# Patient Record
Sex: Female | Born: 1945
Health system: Southern US, Community
[De-identification: ages and names within clinical notes are randomized; demographics above are authoritative.]

## PROBLEM LIST (undated history)

## (undated) DIAGNOSIS — F32A Depression, unspecified: Secondary | ICD-10-CM

## (undated) DIAGNOSIS — F329 Major depressive disorder, single episode, unspecified: Secondary | ICD-10-CM

## (undated) HISTORY — PX: OTHER SURGICAL HISTORY: SHX169

## (undated) HISTORY — PX: ABDOMINAL HYSTERECTOMY: SHX81

## (undated) HISTORY — DX: Depression, unspecified: F32.A

## (undated) HISTORY — PX: BOWEL RESECTION: SHX1257

---
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
## 2000-01-28 ENCOUNTER — Encounter: Payer: Self-pay | Admitting: *Deleted

## 2000-01-28 ENCOUNTER — Encounter: Admission: RE | Admit: 2000-01-28 | Discharge: 2000-01-28 | Payer: Self-pay | Admitting: *Deleted

## 2001-05-01 ENCOUNTER — Encounter: Admission: RE | Admit: 2001-05-01 | Discharge: 2001-05-01 | Payer: Self-pay | Admitting: *Deleted

## 2001-05-01 ENCOUNTER — Encounter: Payer: Self-pay | Admitting: *Deleted

## 2014-07-25 DIAGNOSIS — H2510 Age-related nuclear cataract, unspecified eye: Secondary | ICD-10-CM | POA: Insufficient documentation

## 2015-08-11 DIAGNOSIS — M8589 Other specified disorders of bone density and structure, multiple sites: Secondary | ICD-10-CM | POA: Insufficient documentation

## 2015-08-11 DIAGNOSIS — F325 Major depressive disorder, single episode, in full remission: Secondary | ICD-10-CM | POA: Insufficient documentation

## 2015-08-11 DIAGNOSIS — E782 Mixed hyperlipidemia: Secondary | ICD-10-CM | POA: Insufficient documentation

## 2016-06-22 DIAGNOSIS — F5101 Primary insomnia: Secondary | ICD-10-CM | POA: Insufficient documentation

## 2017-01-10 ENCOUNTER — Emergency Department
Admission: EM | Admit: 2017-01-10 | Discharge: 2017-01-10 | Disposition: A | Discharge status: Home / Self Care | Payer: Medicare Other | Source: Home / Self Care | Attending: Family Medicine | Admitting: Family Medicine

## 2017-01-10 ENCOUNTER — Encounter: Payer: Self-pay | Admitting: Emergency Medicine

## 2017-01-10 DIAGNOSIS — R52 Pain, unspecified: Secondary | ICD-10-CM

## 2017-01-10 DIAGNOSIS — N390 Urinary tract infection, site not specified: Secondary | ICD-10-CM

## 2017-01-10 LAB — POCT URINALYSIS DIP (MANUAL ENTRY)
Glucose, UA: NEGATIVE mg/dL
Nitrite, UA: NEGATIVE
Spec Grav, UA: 1.015 (ref 1.010–1.025)
Urobilinogen, UA: 0.2 E.U./dL
pH, UA: 6 (ref 5.0–8.0)

## 2017-01-10 MED ORDER — CEPHALEXIN 500 MG PO CAPS: 500.0000 mg | ORAL_CAPSULE | Freq: Two times a day (BID) | ORAL | 0 refills | Status: DC

## 2017-01-10 NOTE — ED Triage Notes (Signed)
Patient presents to [facility] with C/O UTI since Friday, seen her PCP, started on Nitrofurantion, Patient has had a fever the last 24 hours. Today 101.1

## 2017-01-10 NOTE — Discharge Instructions (Signed)
°  You should stop taking the Macrobid at this time and start taking the newly prescribed cephalexin (Keflex) antibiotic in case the Macrobid was not treating your mild UTI sufficiently.    Your urine has been sent for a culture for more testing. If your antibiotic needs to be changed again, we will call in a new medication for you.  If the culture is normal, you may be coming down with a separate viral illness.   Be sure to stay well hydrated with at least eight 8oz glasses of water each day and at least 8 hours of sleep at night. You may have acetaminophen and ibuprofen as needed for fever and pain.

## 2017-01-10 NOTE — ED Provider Notes (Signed)
[facility]    CSN: [id number] Arrival date & time: 01/10/17  1754     History   Chief Complaint Chief Complaint  Patient presents with  . Recurrent UTI    HPI Amanda Schmitt is a 71 y.o. female.   HPI  Amanda Schmitt is a 71 y.o. female presenting to UC with c/o UTI for 3 days.  She was seen by her PCP on Friday and was prescribed Macrobid.  A culture was done, which showed 10,000-50,000 colonies but no susceptibly chart given. Since last night, she has had fatigue, body aches, sweats, chills, and fever up to 101.1*F.  She does have a mild cough but denies congestion or any other URI symptoms. No known sick contacts.  She does have mild bladder pressure. She is concerned the Macrobid, which she has never had before, is not working.  She has not taken a dose of that medication today.  Denies n/v/d.   History reviewed. No pertinent past medical history.  There are no active problems to display for this patient.   History reviewed. No pertinent surgical history.  OB History    No data available       Home Medications    Prior to Admission medications   Medication Sig Start Date End Date Taking? Authorizing Provider  acetaminophen (TYLENOL) 500 MG tablet Take 500 mg by mouth every 6 (six) hours as needed.   Yes [provider]  escitalopram (LEXAPRO) 10 MG tablet Take 10 mg by mouth daily.   Yes [provider]  cephALEXin (KEFLEX) 500 MG capsule Take 1 capsule (500 mg total) by mouth 2 (two) times daily. 01/10/17   Lurene Shadow, PA-C    Family History History reviewed. No pertinent family history.  Social History Social History  Substance Use Topics  . Smoking status: Never Smoker  . Smokeless tobacco: Never Used  . Alcohol use 1.2 oz/week    2 Glasses of wine per week     Comment: per night     Allergies   Codeine and Tramadol   Review of Systems Review of Systems  Constitutional: Positive for chills, diaphoresis and fever.    HENT: Negative for congestion.   Respiratory: Positive for choking. Negative for shortness of breath and wheezing.   Gastrointestinal: Negative for diarrhea, nausea and vomiting.  Genitourinary: Positive for dysuria (minimal) and pelvic pain (pressure). Negative for flank pain, frequency and urgency.  Musculoskeletal: Positive for arthralgias and myalgias. Negative for back pain.       Body aches  Skin: Negative for rash.     Physical Exam Triage Vital Signs ED Triage Vitals  Enc Vitals Group     BP 01/10/17 1822 112/72     Pulse Rate 01/10/17 1822 83     Resp 01/10/17 1822 16     Temp 01/10/17 1822 99.1 F (37.3 C)     Temp Source 01/10/17 1822 Oral     SpO2 01/10/17 1822 96 %     Weight 01/10/17 1822 137 lb (62.1 kg)     Height 01/10/17 1822 5' (1.524 m)     Head Circumference --      Peak Flow --      Pain Score 01/10/17 1823 5     Pain Loc --      Pain Edu? --      Excl. in GC? --    No data found.   Updated Vital Signs BP 112/72 (BP Location: Left Arm)  Pulse 83   Temp 99.1 F (37.3 C) (Oral)   Resp 16   Ht 5' (1.524 m)   Wt 137 lb (62.1 kg)   SpO2 96%   BMI 26.76 kg/m   Visual Acuity Right Eye Distance:   Left Eye Distance:   Bilateral Distance:    Right Eye Near:   Left Eye Near:    Bilateral Near:     Physical Exam  Constitutional: She is oriented to person, place, and time. She appears well-developed and well-nourished. No distress.  HENT:  Head: Normocephalic and atraumatic.  Mouth/Throat: Oropharynx is clear and moist.  Eyes: EOM are normal.  Neck: Normal range of motion.  Cardiovascular: Normal rate and regular rhythm.   Pulmonary/Chest: Effort normal and breath sounds normal. No respiratory distress. She has no wheezes. She has no rales.  Abdominal: Soft. She exhibits no distension. There is no tenderness. There is no CVA tenderness.  Musculoskeletal: Normal range of motion.  Neurological: She is alert and oriented to person, place, and  time.  Skin: Skin is warm and dry. She is not diaphoretic.  Psychiatric: She has a normal mood and affect. Her behavior is normal.  Nursing note and vitals reviewed.    UC Treatments / Results  Labs (all labs ordered are listed, but only abnormal results are displayed) Labs Reviewed  POCT URINALYSIS DIP (MANUAL ENTRY) - Abnormal; Notable for the following:       Result Value   Bilirubin, UA small (*)    Ketones, POC UA moderate (40) (*)    Blood, UA trace-intact (*)    Protein Ur, POC trace (*)    Leukocytes, UA Trace (*)    All other components within normal limits  URINE CULTURE    EKG  EKG Interpretation None       Radiology No results found.  Procedures Procedures (including critical care time)  Medications Ordered in UC Medications - No data to display   Initial Impression / Assessment and Plan / UC Course  I have reviewed the triage vital signs and the nursing notes.  Pertinent labs & imaging results that were available during my care of the patient were reviewed by me and considered in my medical decision making (see chart for details).    UA: abnormal but no definite UTI Urine culture sent.   Final Clinical Impressions(s) / UC Diagnoses   Final diagnoses:  Body aches  Recurrent UTI   Symptoms could be due to worsening UTI vs new onset unrelated viral illness.  In meantime, will change pt from Macrobid to Keflex. Encouraged good hydration.  F/u with PCP later this week if not improving.   New Prescriptions Discharge Medication List as of 01/10/2017  6:38 PM    START taking these medications   Details  cephALEXin (KEFLEX) 500 MG capsule Take 1 capsule (500 mg total) by mouth 2 (two) times daily., Starting Mon 01/10/2017, Normal         Controlled Substance Prescriptions [place] Controlled Substance Registry consulted? Not Applicable   Rolla Platehelps, Jaine Estabrooks O, PA-C 01/10/17 [id number]

## 2017-01-13 LAB — URINE CULTURE

## 2017-01-14 ENCOUNTER — Telehealth: Payer: Self-pay | Admitting: *Deleted

## 2017-01-14 NOTE — Telephone Encounter (Signed)
Callback: Patient notified of UCX results. Encouraged to complete course. She reports she is feeling much better.

## 2017-07-08 ENCOUNTER — Other Ambulatory Visit: Payer: Self-pay

## 2017-07-08 ENCOUNTER — Encounter: Payer: Self-pay | Admitting: Emergency Medicine

## 2017-07-08 ENCOUNTER — Emergency Department
Admission: EM | Admit: 2017-07-08 | Discharge: 2017-07-08 | Disposition: A | Discharge status: Home / Self Care | Payer: Medicare Other | Source: Home / Self Care | Attending: Family Medicine | Admitting: Family Medicine

## 2017-07-08 DIAGNOSIS — R300 Dysuria: Secondary | ICD-10-CM | POA: Diagnosis not present

## 2017-07-08 LAB — POCT URINALYSIS DIP (MANUAL ENTRY)
BILIRUBIN UA: NEGATIVE
GLUCOSE UA: NEGATIVE mg/dL
Ketones, POC UA: NEGATIVE mg/dL
NITRITE UA: POSITIVE — AB
Protein Ur, POC: NEGATIVE mg/dL
SPEC GRAV UA: 1.01 (ref 1.010–1.025)
Urobilinogen, UA: 0.2 E.U./dL
pH, UA: 7 (ref 5.0–8.0)

## 2017-07-08 MED ORDER — CEPHALEXIN 500 MG PO CAPS: 500.0000 mg | ORAL_CAPSULE | Freq: Two times a day (BID) | ORAL | 0 refills | Status: DC

## 2017-07-08 NOTE — ED Provider Notes (Signed)
[facility]    CSN: [id number] Arrival date & time: 07/08/17  1620     History   Chief Complaint Chief Complaint  Patient presents with  . Dysuria    HPI Amanda Schmitt is a 72 y.o. female.   Last night patient noticed mild urinary discomfort, worse this morning and improved with AZO.  She had similar symptoms one week ago that resolved after taking AZO.  She denies abdominal, pelvic, or flank pain.  No fevers, chills, and sweats.   The history is provided by the patient.  Dysuria  Pain quality:  Burning Pain severity:  Mild Onset quality:  Sudden Duration:  8 hours Timing:  Constant Progression:  Worsening Chronicity:  Recurrent Recent urinary tract infections: no   Relieved by:  Nothing Worsened by:  Nothing Ineffective treatments:  Phenazopyridine Urinary symptoms: frequent urination and hesitancy   Urinary symptoms: no discolored urine, no foul-smelling urine, no hematuria and no bladder incontinence   Associated symptoms: no abdominal pain, no fever, no flank pain, no nausea, no vaginal discharge and no vomiting     History reviewed. No pertinent past medical history.  There are no active problems to display for this patient.   History reviewed. No pertinent surgical history.  OB History    No data available       Home Medications    Prior to Admission medications   Medication Sig Start Date End Date Taking? Authorizing Provider  acetaminophen (TYLENOL) 500 MG tablet Take 500 mg by mouth every 6 (six) hours as needed.    [provider]  cephALEXin (KEFLEX) 500 MG capsule Take 1 capsule (500 mg total) by mouth 2 (two) times daily. 07/08/17   Lattie HawBeese, Ameri Cahoon A, MD  escitalopram (LEXAPRO) 10 MG tablet Take 10 mg by mouth daily.    [provider]    Family History No family history on file.  Social History Social History   Tobacco Use  . Smoking status: Never Smoker  . Smokeless tobacco: Never Used  Substance Use  Topics  . Alcohol use: Yes    Alcohol/week: 1.2 oz    Types: 2 Glasses of wine per week    Comment: per night  . Drug use: No     Allergies   Codeine; Nitrofuran derivatives; and Tramadol   Review of Systems Review of Systems  Constitutional: Negative for fever.  Gastrointestinal: Negative for abdominal pain, nausea and vomiting.  Genitourinary: Positive for dysuria. Negative for flank pain and vaginal discharge.  All other systems reviewed and are negative.    Physical Exam Triage Vital Signs ED Triage Vitals  Enc Vitals Group     BP 07/08/17 1655 131/75     Pulse Rate 07/08/17 1655 62     Resp --      Temp 07/08/17 1655 97.9 F (36.6 C)     Temp Source 07/08/17 1655 Oral     SpO2 07/08/17 1655 97 %     Weight 07/08/17 1656 137 lb (62.1 kg)     Height 07/08/17 1656 5' (1.524 m)     Head Circumference --      Peak Flow --      Pain Score 07/08/17 1655 3     Pain Loc --      Pain Edu? --      Excl. in GC? --    No data found.  Updated Vital Signs BP 131/75 (BP Location: Right Arm)   Pulse 62   Temp  97.9 F (36.6 C) (Oral)   Ht 5' (1.524 m)   Wt 137 lb (62.1 kg)   SpO2 97%   BMI 26.76 kg/m   Visual Acuity Right Eye Distance:   Left Eye Distance:   Bilateral Distance:    Right Eye Near:   Left Eye Near:    Bilateral Near:     Physical Exam Nursing notes and Vital Signs reviewed. Appearance:  Patient appears stated age, and in no acute distress.    Eyes:  Pupils are equal, round, and reactive to light and accomodation.  Extraocular movement is intact.  Conjunctivae are not inflamed   Pharynx:  Normal; moist mucous membranes  Neck:  Supple.  No adenopathy Lungs:  Clear to auscultation.  Breath sounds are equal.  Moving air well. Heart:  Regular rate and rhythm without murmurs, rubs, or gallops.  Abdomen:  Nontender without masses or hepatosplenomegaly.  Bowel sounds are present.  No CVA or flank tenderness.  Extremities:  No edema.  Skin:  No rash  present.     UC Treatments / Results  Labs (all labs ordered are listed, but only abnormal results are displayed) Labs Reviewed  POCT URINALYSIS DIP (MANUAL ENTRY) - Abnormal; Notable for the following components:      Result Value   Blood, UA trace-intact (*)    Nitrite, UA Positive (*)    Leukocytes, UA Trace (*)    All other components within normal limits  URINE CULTURE    EKG  EKG Interpretation None       Radiology No results found.  Procedures Procedures (including critical care time)  Medications Ordered in UC Medications - No data to display   Initial Impression / Assessment and Plan / UC Course  I have reviewed the triage vital signs and the nursing notes.  Pertinent labs & imaging results that were available during my care of the patient were reviewed by me and considered in my medical decision making (see chart for details).    Urine culture pending. Begin Kefltex 500mg  BID for one week. Increase fluid intake. May use non-prescription AZO for about two days, if desired, to decrease urinary discomfort.  If symptoms become significantly worse during the night or over the weekend, proceed to the local emergency room.  Followup with Family Doctor if not improved in one week.     Final Clinical Impressions(s) / UC Diagnoses   Final diagnoses:  Dysuria    ED Discharge Orders        Ordered    cephALEXin (KEFLEX) 500 MG capsule  2 times daily     07/08/17 1713           Lattie Haw, MD 07/08/17 1721

## 2017-07-08 NOTE — Discharge Instructions (Signed)
Increase fluid intake. May use non-prescription AZO for about two days, if desired, to decrease urinary discomfort.  If symptoms become significantly worse during the night or over the weekend, proceed to the local emergency room.  

## 2017-07-08 NOTE — ED Triage Notes (Signed)
Dysuria Sx started last weekend and resolved after drinking large amounts of water. Then returned last night, took AZO this morning

## 2017-07-11 ENCOUNTER — Telehealth: Payer: Self-pay

## 2017-07-11 LAB — URINE CULTURE
MICRO NUMBER:: 90269206
SPECIMEN QUALITY:: ADEQUATE

## 2017-07-11 NOTE — Telephone Encounter (Signed)
Spoke with patient and gave lab results.  Feeling much better, will follow up as needed.

## 2017-10-06 ENCOUNTER — Emergency Department
Admission: EM | Admit: 2017-10-06 | Discharge: 2017-10-06 | Disposition: A | Discharge status: Home / Self Care | Payer: Medicare Other | Source: Home / Self Care

## 2017-10-06 ENCOUNTER — Other Ambulatory Visit: Payer: Self-pay

## 2017-10-06 ENCOUNTER — Encounter: Payer: Self-pay | Admitting: Emergency Medicine

## 2017-10-06 DIAGNOSIS — N390 Urinary tract infection, site not specified: Secondary | ICD-10-CM

## 2017-10-06 LAB — POCT URINALYSIS DIP (MANUAL ENTRY)
Bilirubin, UA: NEGATIVE
Glucose, UA: 100 mg/dL — AB
Nitrite, UA: POSITIVE — AB
Protein Ur, POC: NEGATIVE mg/dL
Spec Grav, UA: <=1.005 — AB (ref 1.010–1.025)
Urobilinogen, UA: 1 E.U./dL
pH, UA: 5 (ref 5.0–8.0)

## 2017-10-06 MED ORDER — CIPROFLOXACIN HCL 500 MG PO TABS: 500.0000 mg | ORAL_TABLET | Freq: Two times a day (BID) | ORAL | 1 refills | Status: DC

## 2017-10-06 NOTE — ED Triage Notes (Signed)
Dysuria, polyuria started today 

## 2017-10-06 NOTE — ED Provider Notes (Signed)
[facility]   [id number] 10/06/17 Arrival Time: 1830   SUBJECTIVE:  Amanda Schmitt is a 72 y.o. female who presents to the urgent care with complaint of dysuria, frequency, and some lower abdominal crampy pain which began this afternoon.  No fever, flank pain, hematuria, or nausea  This is the third UTI this patient has suffered in the last year.  She has a physical coming up in September and I suggest that she bring this up with her primary care doctor at that time unless she develops 2 more UTIs before then.     History reviewed. No pertinent past medical history. No family history on file. Social History   Socioeconomic History  . Marital status: Single    Spouse name: Not on file  . Number of children: Not on file  . Years of education: Not on file  . Highest education level: Not on file  Occupational History  . Not on file  Social Needs  . Financial resource strain: Not on file  . Food insecurity:    Worry: Not on file    Inability: Not on file  . Transportation needs:    Medical: Not on file    Non-medical: Not on file  Tobacco Use  . Smoking status: Never Smoker  . Smokeless tobacco: Never Used  Substance and Sexual Activity  . Alcohol use: Yes    Alcohol/week: 1.2 oz    Types: 2 Glasses of wine per week    Comment: per night  . Drug use: No  . Sexual activity: Not on file  Lifestyle  . Physical activity:    Days per week: Not on file    Minutes per session: Not on file  . Stress: Not on file  Relationships  . Social connections:    Talks on phone: Not on file    Gets together: Not on file    Attends religious service: Not on file    Active member of club or organization: Not on file    Attends meetings of clubs or organizations: Not on file    Relationship status: Not on file  . Intimate partner violence:    Fear of current or ex partner: Not on file    Emotionally abused: Not on file    Physically abused: Not on file    Forced sexual  activity: Not on file  Other Topics Concern  . Not on file  Social History Narrative  . Not on file   Current Meds  Medication Sig  . zolpidem (AMBIEN) 5 MG tablet Take 5 mg by mouth at bedtime as needed for sleep.   Allergies  Allergen Reactions  . Codeine Nausea Only  . Nitrofuran Derivatives   . Tramadol Nausea Only      ROS: As per HPI, remainder of ROS negative.   OBJECTIVE:   Vitals:   10/06/17 1853 10/06/17 1854  BP: 128/73   Pulse: 70   Temp: (!) 97.4 F (36.3 C)   TempSrc: Oral   SpO2: 98%   Weight:  132 lb (59.9 kg)  Height:  5' (1.524 m)     General appearance: alert; no distress Eyes: PERRL; EOMI; conjunctiva normal HENT: normocephalic; atraumaticoral mucosa normal Neck: supple Lungs: clear to auscultation bilaterally Heart: regular rate and rhythm Abdomen: soft, non-tender; bowel sounds normal; no masses or organomegaly; no guarding or rebound tenderness Back: no CVA tenderness Extremities: no cyanosis or edema; symmetrical with no gross deformities Skin: warm and dry Neurologic: normal  gait; grossly normal Psychological: alert and cooperative; normal mood and affect      Labs:  Results for orders placed or performed during the hospital encounter of 10/06/17  POCT urinalysis dipstick (new)  Result Value Ref Range   Color, UA orange (A) yellow   Clarity, UA clear clear   Glucose, UA =100 (A) negative mg/dL   Bilirubin, UA negative negative   Ketones, POC UA small (15) (A) negative mg/dL   Spec Grav, UA <=1.610 (A) 1.010 - 1.025   Blood, UA small (A) negative   pH, UA 5.0 5.0 - 8.0   Protein Ur, POC negative negative mg/dL   Urobilinogen, UA 1.0 0.2 or 1.0 E.U./dL   Nitrite, UA Positive (A) Negative   Leukocytes, UA Trace (A) Negative    Labs Reviewed  POCT URINALYSIS DIP (MANUAL ENTRY) - Abnormal; Notable for the following components:      Result Value   Color, UA orange (*)    Glucose, UA =100 (*)    Ketones, POC UA small (15)  (*)    Spec Grav, UA <=1.005 (*)    Blood, UA small (*)    Nitrite, UA Positive (*)    Leukocytes, UA Trace (*)    All other components within normal limits  URINE CULTURE    No results found.     ASSESSMENT & PLAN:  1. Lower urinary tract infectious disease     Meds ordered this encounter  Medications  . ciprofloxacin (CIPRO) 500 MG tablet    Sig: Take 1 tablet (500 mg total) by mouth 2 (two) times daily.    Dispense:  20 tablet    Refill:  1    Reviewed expectations re: course of current medical issues. Questions answered. Outlined signs and symptoms indicating need for more acute intervention. Patient verbalized understanding. After Visit Summary given.    Procedures:      Elvina Sidle, MD 10/06/17 1859

## 2017-10-08 ENCOUNTER — Telehealth: Payer: Self-pay | Admitting: Emergency Medicine

## 2017-10-08 LAB — URINE CULTURE
MICRO NUMBER:: 90653562
SPECIMEN QUALITY:: ADEQUATE

## 2017-10-08 NOTE — Telephone Encounter (Signed)
Patient informed of negative urine culture results.  Patient states that she is feeling better and will continue medication as prescribed. Once she completes the antbs, if sxs return, she will follow up with her PCP.

## 2018-05-24 LAB — HM DEXA SCAN

## 2018-06-09 LAB — COLOGUARD: Cologuard: NEGATIVE

## 2019-08-13 LAB — HM MAMMOGRAPHY: HM Mammogram: NORMAL (ref 0–4)

## 2019-09-27 ENCOUNTER — Ambulatory Visit (INDEPENDENT_AMBULATORY_CARE_PROVIDER_SITE_OTHER): Payer: Medicare PPO

## 2019-09-27 ENCOUNTER — Ambulatory Visit (INDEPENDENT_AMBULATORY_CARE_PROVIDER_SITE_OTHER): Payer: Medicare PPO | Admitting: Family Medicine

## 2019-09-27 ENCOUNTER — Encounter: Payer: Self-pay | Admitting: Family Medicine

## 2019-09-27 ENCOUNTER — Other Ambulatory Visit: Payer: Self-pay

## 2019-09-27 VITALS — BP 146/69 | HR 72 | Temp 98.1°F | Ht 60.0 in | Wt 129.7 lb

## 2019-09-27 DIAGNOSIS — M79674 Pain in right toe(s): Secondary | ICD-10-CM

## 2019-09-27 DIAGNOSIS — R1013 Epigastric pain: Secondary | ICD-10-CM

## 2019-09-27 DIAGNOSIS — R002 Palpitations: Secondary | ICD-10-CM | POA: Diagnosis not present

## 2019-09-27 DIAGNOSIS — G500 Trigeminal neuralgia: Secondary | ICD-10-CM | POA: Insufficient documentation

## 2019-09-27 LAB — CBC
HCT: 37.3 % (ref 35.0–45.0)
Hemoglobin: 12.8 g/dL (ref 11.7–15.5)
MCH: 34 pg — ABNORMAL HIGH (ref 27.0–33.0)
MCHC: 34.3 g/dL (ref 32.0–36.0)
MCV: 99.2 fL (ref 80.0–100.0)
MPV: 10.4 fL (ref 7.5–12.5)
Platelets: 281 10*3/uL (ref 140–400)
RBC: 3.76 10*6/uL — ABNORMAL LOW (ref 3.80–5.10)
RDW: 11.6 % (ref 11.0–15.0)
WBC: 6.6 10*3/uL (ref 3.8–10.8)

## 2019-09-27 MED ORDER — OMEPRAZOLE 40 MG PO CPDR: 40.0000 mg | DELAYED_RELEASE_CAPSULE | Freq: Every day | ORAL | 0 refills | Status: DC

## 2019-09-27 NOTE — Patient Instructions (Addendum)
Great to meet you today! EKG looks ok.   We'll be in touch with results and recommendations from your xray and lab work.  Let's restart prilosec and see if that helps with the discomfort in the upper abdomen as well.  See me again in about 6 weeks for follow up of this.

## 2019-09-27 NOTE — Progress Notes (Signed)
Amanda Schmitt - 74 y.o. female MRN [id number]  Date of birth: 20-Oct-1945  Subjective Chief Complaint  Patient presents with  . Establish Care    HPI Amanda Schmitt is a 74 y.o. female with history of GERD, HLD, and idiopathic constipation here today for initial visit.  She reports that she has had sensation of palpitations in her chest as well as epigastric pain.  Started about 3 weeks ago. Palpitations come and go.  No associated dizziness or shortness of breath. She does not have chest pain or shortness of breath.  She does feel nauseous in the mornings but improves after eating.  She is taking pepcid and maalox which helps some.  History of GERD that resolved with prilosec previously.    She also has complaint of R toe pain.  Does not recall injury but describe as sharp and throbbing.  She would like to have xray.   ROS:  A comprehensive ROS was completed and negative except as noted per HPI  Allergies  Allergen Reactions  . Codeine Nausea Only  . Nitrofuran Derivatives   . Tramadol Nausea Only    History reviewed. No pertinent past medical history.  Past Surgical History:  Procedure Laterality Date  . ABDOMINAL HYSTERECTOMY    . Bowel blockage      Social History   Socioeconomic History  . Marital status: Single    Spouse name: Not on file  . Number of children: Not on file  . Years of education: Not on file  . Highest education level: Not on file  Occupational History  . Not on file  Tobacco Use  . Smoking status: Former Smoker    Packs/day: 0.50    Years: 26.00    Pack years: 13.00    Quit date: 1999    Years since quitting: 22.3  . Smokeless tobacco: Never Used  Substance and Sexual Activity  . Alcohol use: Yes    Alcohol/week: 2.0 standard drinks    Types: 2 Glasses of wine per week    Comment: per night  . Drug use: No  . Sexual activity: Not Currently  Other Topics Concern  . Not on file  Social History Narrative  . Not on file   Social  Determinants of Health   Financial Resource Strain:   . Difficulty of Paying Living Expenses:   Food Insecurity:   . Worried About Programme researcher, broadcasting/film/video in the Last Year:   . Barista in the Last Year:   Transportation Needs:   . Freight forwarder (Medical):   Marland Kitchen Lack of Transportation (Non-Medical):   Physical Activity:   . Days of Exercise per Week:   . Minutes of Exercise per Session:   Stress:   . Feeling of Stress :   Social Connections:   . Frequency of Communication with Friends and Family:   . Frequency of Social Gatherings with Friends and Family:   . Attends Religious Services:   . Active Member of Clubs or Organizations:   . Attends Banker Meetings:   Marland Kitchen Marital Status:     History reviewed. No pertinent family history.  Health Maintenance  Topic Date Due  . Hepatitis C Screening  Never done  . MAMMOGRAM  Never done  . COLONOSCOPY  Never done  . DEXA SCAN  Never done  . INFLUENZA VACCINE  12/09/2019  . TETANUS/TDAP  08/24/2026  . COVID-19 Vaccine  Completed  . PNA vac Low Risk Adult  Completed     ----------------------------------------------------------------------------------------------------------------------------------------------------------------------------------------------------------------- Physical Exam BP (!) 146/69 (BP Location: Left Arm, Patient Position: Sitting, Cuff Size: Normal)   Pulse 72   Temp 98.1 F (36.7 C) (Temporal)   Wt 129 lb 11.2 oz (58.8 kg)   SpO2 100%   BMI 25.33 kg/m   Physical Exam Constitutional:      Appearance: Normal appearance.  HENT:     Head: Normocephalic and atraumatic.  Eyes:     General: No scleral icterus. Cardiovascular:     Rate and Rhythm: Normal rate and regular rhythm.     Comments: Occasional extra beat.  Pulmonary:     Effort: Pulmonary effort is normal.     Breath sounds: Normal breath sounds.  Abdominal:     General: Abdomen is flat. There is no distension.      Palpations: Abdomen is soft.     Tenderness: There is no abdominal tenderness.  Musculoskeletal:     Cervical back: Neck supple.  Neurological:     General: No focal deficit present.     Mental Status: She is alert.  Psychiatric:        Mood and Affect: Mood normal.        Behavior: Behavior normal.     ------------------------------------------------------------------------------------------------------------------------------------------------------------------------------------------------------------------- Assessment and Plan  Palpitations She has what sounds like a few ectopic beats on exam, likely PVC's EKG NSR with rate of 63.  No ischemic changes/ST elevation.  Question whether her symptoms may be from esophageal spasm as well. Restarting prilosec.  Check TSH, cbc and electrolytes  No follow-ups on file.    Epigastric pain Likely related to GERD, restart prilosec.   Toe pain, right Xray ordered.    Meds ordered this encounter  Medications  . omeprazole (PRILOSEC) 40 MG capsule    Sig: Take 1 capsule (40 mg total) by mouth daily.    Dispense:  30 capsule    Refill:  0    No follow-ups on file.    This visit occurred during the SARS-CoV-2 public health emergency.  Safety protocols were in place, including screening questions prior to the visit, additional usage of staff PPE, and extensive cleaning of exam room while observing appropriate contact time as indicated for disinfecting solutions.

## 2019-09-27 NOTE — Assessment & Plan Note (Signed)
X-ray ordered.

## 2019-09-27 NOTE — Assessment & Plan Note (Signed)
She has what sounds like a few ectopic beats on exam, likely PVC's EKG NSR with rate of 63.  No ischemic changes/ST elevation.  Question whether her symptoms may be from esophageal spasm as well. Restarting prilosec.  Check TSH, cbc and electrolytes  No follow-ups on file.

## 2019-09-27 NOTE — Assessment & Plan Note (Signed)
Likely related to GERD, restart prilosec.

## 2019-09-28 LAB — COMPLETE METABOLIC PANEL WITH GFR
AG Ratio: 1.9 (calc) (ref 1.0–2.5)
ALT: 9 U/L (ref 6–29)
AST: 20 U/L (ref 10–35)
Albumin: 4.5 g/dL (ref 3.6–5.1)
Alkaline phosphatase (APISO): 77 U/L (ref 37–153)
BUN: 16 mg/dL (ref 7–25)
CO2: 26 mmol/L (ref 20–32)
Calcium: 9.7 mg/dL (ref 8.6–10.4)
Chloride: 103 mmol/L (ref 98–110)
Creat: 0.75 mg/dL (ref 0.60–0.93)
GFR, Est African American: 91 mL/min/{1.73_m2} (ref >=60)
GFR, Est Non African American: 79 mL/min/{1.73_m2} (ref >=60)
Globulin: 2.4 g/dL (calc) (ref 1.9–3.7)
Glucose, Bld: 85 mg/dL (ref 65–99)
Potassium: 4.8 mmol/L (ref 3.5–5.3)
Sodium: 139 mmol/L (ref 135–146)
Total Bilirubin: 0.6 mg/dL (ref 0.2–1.2)
Total Protein: 6.9 g/dL (ref 6.1–8.1)

## 2019-09-28 LAB — TSH: TSH: 2.38 mIU/L (ref 0.40–4.50)

## 2019-10-11 ENCOUNTER — Other Ambulatory Visit: Payer: Self-pay | Admitting: Family Medicine

## 2019-10-11 ENCOUNTER — Encounter: Payer: Self-pay | Admitting: Family Medicine

## 2019-10-11 MED ORDER — ZOLPIDEM TARTRATE 10 MG PO TABS: 10.0000 mg | ORAL_TABLET | Freq: Every evening | ORAL | 5 refills | Status: DC | PRN

## 2019-10-16 ENCOUNTER — Ambulatory Visit (INDEPENDENT_AMBULATORY_CARE_PROVIDER_SITE_OTHER): Payer: Medicare PPO | Admitting: Family Medicine

## 2019-10-16 ENCOUNTER — Encounter: Payer: Self-pay | Admitting: Family Medicine

## 2019-10-16 DIAGNOSIS — F411 Generalized anxiety disorder: Secondary | ICD-10-CM | POA: Diagnosis not present

## 2019-10-16 MED ORDER — ESCITALOPRAM OXALATE 10 MG PO TABS: 10.0000 mg | ORAL_TABLET | Freq: Every day | ORAL | 0 refills | Status: DC

## 2019-10-16 MED ORDER — CLONAZEPAM 0.5 MG PO TABS: 0.5000 mg | ORAL_TABLET | Freq: Two times a day (BID) | ORAL | 1 refills | Status: DC | PRN

## 2019-10-16 NOTE — Patient Instructions (Signed)
Nice to see you today! Restart lexapro: 5mg  for first week then increase to 10mg  daily.  You may use clonazepam up to twice per day as needed for severe anxiety symptoms.  Please follow up with me in about 6 weeks.

## 2019-10-16 NOTE — Assessment & Plan Note (Signed)
Will restart lexapro 5mg  for 1st week then increase to 10mg .  Will also add clonazepam 0.5mg , 1/2-1 tab bid prn short term for the first few weeks until lexapro is effective for her.  She will continue ambien at bedtime.  If she continues to have difficulty with sleep we can consider trying extended release version.

## 2019-10-16 NOTE — Progress Notes (Signed)
Amanda Schmitt - 74 y.o. female MRN [id number]  Date of birth: [birth year]  Subjective Chief Complaint  Patient presents with  . Anxiety    HPI Amanda Schmitt is a 74 y.o. female here today for follow up of anxiety and GI symptoms.  She feels like her GI symptoms may be coming from her anxiety.  She has weaned herself from lexapro and is now having symptoms that she was having before she started on lexapro including increased palpitations and worry.  She is also having increased difficulty with sleeping and feels tense all of the time.  She has had some improvement in GERD symptoms with prilosec.  She would like to restart lexapro but feels like she may need something temporarily until lexapro is effective again for her.    Depression screen [facility] 2/9 10/16/2019 09/27/2019  Decreased Interest 1 0  Down, Depressed, Hopeless 0 0  PHQ - 2 Score 1 0  Altered sleeping 3 0  Tired, decreased energy 0 0  Change in appetite 0 0  Feeling bad or failure about yourself  0 0  Trouble concentrating 1 0  Moving slowly or fidgety/restless 0 0  Suicidal thoughts 0 0  PHQ-9 Score 5 0  Difficult doing work/chores Somewhat difficult -   GAD 7 : Generalized Anxiety Score 10/16/2019 09/27/2019  Nervous, Anxious, on Edge 3 0  Control/stop worrying 2 0  Worry too much - different things 2 0  Trouble relaxing 1 0  Restless 0 0  Easily annoyed or irritable 1 0  Afraid - awful might happen 0 0  Total GAD 7 Score 9 0  Anxiety Difficulty Somewhat difficult -      ROS:  A comprehensive ROS was completed and negative except as noted per HPI  Allergies  Allergen Reactions  . Codeine Nausea Only  . Nitrofuran Derivatives   . Tramadol Nausea Only    Past Medical History:  Diagnosis Date  . Depression     Past Surgical History:  Procedure Laterality Date  . ABDOMINAL HYSTERECTOMY    . ABDOMINAL HYSTERECTOMY    . Bowel blockage    . BOWEL RESECTION    . CESAREAN SECTION      Social History    Socioeconomic History  . Marital status: Single    Spouse name: Not on file  . Number of children: Not on file  . Years of education: Not on file  . Highest education level: Not on file  Occupational History  . Not on file  Tobacco Use  . Smoking status: Former Smoker    Packs/day: 0.50    Years: 26.00    Pack years: 13.00    Quit date: 1999    Years since quitting: 22.4  . Smokeless tobacco: Never Used  Substance and Sexual Activity  . Alcohol use: Yes    Alcohol/week: 2.0 standard drinks    Types: 2 Glasses of wine per week    Comment: per night  . Drug use: No  . Sexual activity: Not Currently  Other Topics Concern  . Not on file  Social History Narrative  . Not on file   Social Determinants of Health   Financial Resource Strain:   . Difficulty of Paying Living Expenses:   Food Insecurity:   . Worried About Programme researcher, broadcasting/film/video in the Last Year:   . Barista in the Last Year:   Transportation Needs:   . Freight forwarder (Medical):   Marland Kitchen  Lack of Transportation (Non-Medical):   Physical Activity:   . Days of Exercise per Week:   . Minutes of Exercise per Session:   Stress:   . Feeling of Stress :   Social Connections:   . Frequency of Communication with Friends and Family:   . Frequency of Social Gatherings with Friends and Family:   . Attends Religious Services:   . Active Member of Clubs or Organizations:   . Attends Archivist Meetings:   Marland Kitchen Marital Status:     Family History  Problem Relation Age of Onset  . Cancer Father     Health Maintenance  Topic Date Due  . Hepatitis C Screening  Never done  . COLONOSCOPY  Never done  . INFLUENZA VACCINE  12/09/2019  . MAMMOGRAM  08/12/2021  . TETANUS/TDAP  08/24/2026  . DEXA SCAN  Completed  . COVID-19 Vaccine  Completed  . PNA vac Low Risk Adult  Completed      ----------------------------------------------------------------------------------------------------------------------------------------------------------------------------------------------------------------- Physical Exam BP (!) 150/66 (BP Location: Left Arm, Patient Position: Sitting, Cuff Size: Small)   Pulse (!) 59   Ht 4' 11.84" (1.52 m)   Wt 130 lb 8 oz (59.2 kg)   SpO2 100%   BMI 25.62 kg/m   Physical Exam Constitutional:      Appearance: Normal appearance.  HENT:     Head: Normocephalic and atraumatic.  Cardiovascular:     Rate and Rhythm: Normal rate and regular rhythm.  Musculoskeletal:     Cervical back: Neck supple.  Neurological:     General: No focal deficit present.     Mental Status: She is alert.  Psychiatric:        Mood and Affect: Mood normal.        Behavior: Behavior normal.     ------------------------------------------------------------------------------------------------------------------------------------------------------------------------------------------------------------------- Assessment and Plan  GAD (generalized anxiety disorder) Will restart lexapro 5mg  for 1st week then increase to 10mg .  Will also add clonazepam 0.5mg , 1/2-1 tab bid prn short term for the first few weeks until lexapro is effective for her.  She will continue ambien at bedtime.  If she continues to have difficulty with sleep we can consider trying extended release version.    Meds ordered this encounter  Medications  . escitalopram (LEXAPRO) 10 MG tablet    Sig: Take 1 tablet (10 mg total) by mouth daily.    Dispense:  90 tablet    Refill:  0  . clonazePAM (KLONOPIN) 0.5 MG tablet    Sig: Take 1 tablet (0.5 mg total) by mouth 2 (two) times daily as needed for anxiety.    Dispense:  20 tablet    Refill:  1    Return in about 6 weeks (around 11/27/2019) for Anxiety.    This visit occurred during the SARS-CoV-2 public health emergency.  Safety protocols  were in place, including screening questions prior to the visit, additional usage of staff PPE, and extensive cleaning of exam room while observing appropriate contact time as indicated for disinfecting solutions.

## 2019-10-19 ENCOUNTER — Other Ambulatory Visit: Payer: Self-pay | Admitting: Family Medicine

## 2019-10-23 NOTE — Telephone Encounter (Signed)
CM-Plz see refill req/thx dmf 

## 2019-10-25 ENCOUNTER — Other Ambulatory Visit: Payer: Self-pay

## 2019-11-08 ENCOUNTER — Ambulatory Visit: Payer: Medicare PPO | Admitting: Family Medicine

## 2019-11-27 ENCOUNTER — Ambulatory Visit: Payer: Medicare PPO | Admitting: Family Medicine

## 2020-01-13 ENCOUNTER — Other Ambulatory Visit: Payer: Self-pay | Admitting: Family Medicine

## 2020-02-05 ENCOUNTER — Encounter: Payer: Self-pay | Admitting: Family Medicine

## 2020-02-05 ENCOUNTER — Ambulatory Visit (INDEPENDENT_AMBULATORY_CARE_PROVIDER_SITE_OTHER): Payer: Medicare PPO | Admitting: Family Medicine

## 2020-02-05 VITALS — BP 139/61 | HR 66 | Wt 129.4 lb

## 2020-02-05 DIAGNOSIS — Z23 Encounter for immunization: Secondary | ICD-10-CM | POA: Diagnosis not present

## 2020-02-05 DIAGNOSIS — F411 Generalized anxiety disorder: Secondary | ICD-10-CM

## 2020-02-05 DIAGNOSIS — F3342 Major depressive disorder, recurrent, in full remission: Secondary | ICD-10-CM

## 2020-02-05 DIAGNOSIS — F5101 Primary insomnia: Secondary | ICD-10-CM | POA: Diagnosis not present

## 2020-02-05 DIAGNOSIS — Z1211 Encounter for screening for malignant neoplasm of colon: Secondary | ICD-10-CM | POA: Diagnosis not present

## 2020-02-05 MED ORDER — ZOLPIDEM TARTRATE ER 6.25 MG PO TBCR: 6.2500 mg | EXTENDED_RELEASE_TABLET | Freq: Every evening | ORAL | 2 refills | Status: DC | PRN

## 2020-02-05 MED ORDER — VITAMIN D (ERGOCALCIFEROL) 1.25 MG (50000 UNIT) PO CAPS: 50000.0000 [IU] | ORAL_CAPSULE | ORAL | 0 refills | Status: DC

## 2020-02-05 MED ORDER — CLONAZEPAM 0.5 MG PO TABS
0.5000 mg | ORAL_TABLET | Freq: Two times a day (BID) | ORAL | 1 refills | Status: DC | PRN
Start: 2020-02-05 — End: 2020-06-30

## 2020-02-05 NOTE — Patient Instructions (Signed)
Let's try changing ambien to [place] CR at a slightly reduced dose.  Continue lexapro daily.  I have entered a referral for colonoscopy.

## 2020-02-05 NOTE — Progress Notes (Signed)
Amanda Schmitt - 74 y.o. female MRN [id number]  Date of birth: 1945/08/12  Subjective Chief Complaint  Patient presents with  . Anxiety    HPI Amanda Schmitt is a 74 y.o. female here today for follow up of anxiety.  She reports that she is doing much better since re-starting lexapro.  He is not having side effects related to medication.  She does continue to have some difficulty with sleep.  She often wakes in the middle of the night and has difficulty going back to sleep.  She falls to sleep easily with current dose of ambien and does feel a little tired in the mornings. This was less noticeable at 5mg  but had difficulty falling asleep with this dose.  She has never tried extended release ambien.  She does have clonazepam as well but she uses this very sparingly.    ROS:  A comprehensive ROS was completed and negative except as noted per HPI  Allergies  Allergen Reactions  . Codeine Nausea Only  . Nitrofuran Derivatives   . Tramadol Nausea Only    Past Medical History:  Diagnosis Date  . Depression     Past Surgical History:  Procedure Laterality Date  . ABDOMINAL HYSTERECTOMY    . ABDOMINAL HYSTERECTOMY    . Bowel blockage    . BOWEL RESECTION    . CESAREAN SECTION      Social History   Socioeconomic History  . Marital status: Widowed    Spouse name: Not on file  . Number of children: Not on file  . Years of education: Not on file  . Highest education level: Not on file  Occupational History  . Not on file  Tobacco Use  . Smoking status: Former Smoker    Packs/day: 0.50    Years: 26.00    Pack years: 13.00    Quit date: 1999    Years since quitting: 22.7  . Smokeless tobacco: Never Used  Substance and Sexual Activity  . Alcohol use: Yes    Alcohol/week: 2.0 standard drinks    Types: 2 Glasses of wine per week    Comment: per night  . Drug use: No  . Sexual activity: Not Currently  Other Topics Concern  . Not on file  Social History Narrative  . Not on  file   Social Determinants of Health   Financial Resource Strain:   . Difficulty of Paying Living Expenses: Not on file  Food Insecurity:   . Worried About 2000 in the Last Year: Not on file  . Ran Out of Food in the Last Year: Not on file  Transportation Needs:   . Lack of Transportation (Medical): Not on file  . Lack of Transportation (Non-Medical): Not on file  Physical Activity:   . Days of Exercise per Week: Not on file  . Minutes of Exercise per Session: Not on file  Stress:   . Feeling of Stress : Not on file  Social Connections:   . Frequency of Communication with Friends and Family: Not on file  . Frequency of Social Gatherings with Friends and Family: Not on file  . Attends Religious Services: Not on file  . Active Member of Clubs or Organizations: Not on file  . Attends Programme researcher, broadcasting/film/video Meetings: Not on file  . Marital Status: Not on file    Family History  Problem Relation Age of Onset  . Cancer Father     Health Maintenance  Topic Date  Due  . Hepatitis C Screening  Never done  . COLONOSCOPY  Never done  . MAMMOGRAM  08/12/2021  . TETANUS/TDAP  08/24/2026  . INFLUENZA VACCINE  Completed  . DEXA SCAN  Completed  . COVID-19 Vaccine  Completed  . PNA vac Low Risk Adult  Completed     ----------------------------------------------------------------------------------------------------------------------------------------------------------------------------------------------------------------- Physical Exam BP 139/61 (BP Location: Left Arm, Patient Position: Sitting, Cuff Size: Small)   Pulse 66   Wt 129 lb 6.4 oz (58.7 kg)   SpO2 99%   BMI 25.40 kg/m   Physical Exam Constitutional:      Appearance: Normal appearance.  HENT:     Head: Normocephalic and atraumatic.  Musculoskeletal:     Cervical back: Neck supple.  Neurological:     General: No focal deficit present.     Mental Status: She is alert.  Psychiatric:        Mood and  Affect: Mood normal.        Behavior: Behavior normal.     ------------------------------------------------------------------------------------------------------------------------------------------------------------------------------------------------------------------- Assessment and Plan  GAD (generalized anxiety disorder) Her anxiety and depression has improved quite a bit since restarting lexapro.  She is tolerating well and we'll continue at current strength.  She also has alprazolam as needed, which she is using sparingly.  Prescription for this renewed today.   Primary insomnia She continues to have problems with insomnia.  We'll try switching to extended release ambien at 6.25mg  daily.  I'll plan to follow up with her in 3 months.   Colon cancer screening Referral entered to have updated colonoscopy.    Meds ordered this encounter  Medications  . zolpidem (AMBIEN CR) 6.25 MG CR tablet    Sig: Take 1 tablet (6.25 mg total) by mouth at bedtime as needed for sleep.    Dispense:  30 tablet    Refill:  2  . Vitamin D, Ergocalciferol, (DRISDOL) 1.25 MG (50000 UNIT) CAPS capsule    Sig: Take 1 capsule (50,000 Units total) by mouth every 7 (seven) days.    Dispense:  8 capsule    Refill:  0  . clonazePAM (KLONOPIN) 0.5 MG tablet    Sig: Take 1 tablet (0.5 mg total) by mouth 2 (two) times daily as needed for anxiety.    Dispense:  20 tablet    Refill:  1    Return in about 3 months (around 05/06/2020) for Insomnia.    This visit occurred during the SARS-CoV-2 public health emergency.  Safety protocols were in place, including screening questions prior to the visit, additional usage of staff PPE, and extensive cleaning of exam room while observing appropriate contact time as indicated for disinfecting solutions.

## 2020-02-05 NOTE — Assessment & Plan Note (Signed)
Referral entered to have updated colonoscopy.

## 2020-02-05 NOTE — Assessment & Plan Note (Signed)
She continues to have problems with insomnia.  We'll try switching to extended release ambien at 6.25mg  daily.  I'll plan to follow up with her in 3 months.

## 2020-02-05 NOTE — Assessment & Plan Note (Signed)
Her anxiety and depression has improved quite a bit since restarting lexapro.  She is tolerating well and we'll continue at current strength.  She also has alprazolam as needed, which she is using sparingly.  Prescription for this renewed today.

## 2020-02-18 DIAGNOSIS — R1084 Generalized abdominal pain: Secondary | ICD-10-CM | POA: Diagnosis not present

## 2020-02-18 DIAGNOSIS — Z1211 Encounter for screening for malignant neoplasm of colon: Secondary | ICD-10-CM | POA: Diagnosis not present

## 2020-02-18 DIAGNOSIS — K929 Disease of digestive system, unspecified: Secondary | ICD-10-CM | POA: Diagnosis not present

## 2020-02-18 DIAGNOSIS — R197 Diarrhea, unspecified: Secondary | ICD-10-CM | POA: Diagnosis not present

## 2020-03-30 ENCOUNTER — Other Ambulatory Visit: Payer: Self-pay | Admitting: Family Medicine

## 2020-04-05 ENCOUNTER — Other Ambulatory Visit: Payer: Self-pay | Admitting: Family Medicine

## 2020-04-11 ENCOUNTER — Other Ambulatory Visit: Payer: Self-pay | Admitting: Family Medicine

## 2020-04-13 ENCOUNTER — Other Ambulatory Visit: Payer: Self-pay | Admitting: Family Medicine

## 2020-04-15 ENCOUNTER — Ambulatory Visit (INDEPENDENT_AMBULATORY_CARE_PROVIDER_SITE_OTHER): Payer: Medicare PPO | Admitting: Family Medicine

## 2020-04-15 ENCOUNTER — Encounter: Payer: Self-pay | Admitting: Family Medicine

## 2020-04-15 ENCOUNTER — Other Ambulatory Visit: Payer: Self-pay

## 2020-04-15 DIAGNOSIS — M7061 Trochanteric bursitis, right hip: Secondary | ICD-10-CM | POA: Insufficient documentation

## 2020-04-15 DIAGNOSIS — S76311A Strain of muscle, fascia and tendon of the posterior muscle group at thigh level, right thigh, initial encounter: Secondary | ICD-10-CM

## 2020-04-15 MED ORDER — MELOXICAM 7.5 MG PO TABS: 7.5000 mg | ORAL_TABLET | Freq: Two times a day (BID) | ORAL | 0 refills | Status: DC | PRN

## 2020-04-15 NOTE — Assessment & Plan Note (Signed)
Start meloxicam 7.5mg  1-2x per day as needed.  She may continue to use ice/heat as needed.  Given handout for home stretches.  Instructed to follow up if not improving in 3-4 weeks.

## 2020-04-15 NOTE — Patient Instructions (Addendum)
Try meloxicam 1-2 times daily as needed.  Try stretches on handout.  Hope you have a great vacation!

## 2020-04-15 NOTE — Progress Notes (Signed)
Amanda Schmitt - 74 y.o. female MRN [id number]  Date of birth: 1945-06-18  Subjective Chief Complaint  Patient presents with  . Hip Pain    HPI Amanda Schmitt is a 74 y.o. female here today with complaint of R posterior hip/buttock pain.  This started a few days ago.  She does not recall any particular injury or overuse.  She was climbing some attic stairs a few days ago but this isn't really outside of her typical activity.  She does get some occasional radiation down the back of the leg.  There is no pain in the front or lateral side of the hip.  Sitting seems to make this worse.  It is a little better with walking.  She does not have weakness in the leg.  She is traveling to [place] for the holidays next week and wanted to have this checked before leaving.    ROS:  A comprehensive ROS was completed and negative except as noted per HPI  Allergies  Allergen Reactions  . Codeine Nausea Only  . Nitrofuran Derivatives   . Tramadol Nausea Only    Past Medical History:  Diagnosis Date  . Depression     Past Surgical History:  Procedure Laterality Date  . ABDOMINAL HYSTERECTOMY    . ABDOMINAL HYSTERECTOMY    . Bowel blockage    . BOWEL RESECTION    . CESAREAN SECTION      Social History   Socioeconomic History  . Marital status: Widowed    Spouse name: Not on file  . Number of children: Not on file  . Years of education: Not on file  . Highest education level: Not on file  Occupational History  . Not on file  Tobacco Use  . Smoking status: Former Smoker    Packs/day: 0.50    Years: 26.00    Pack years: 13.00    Quit date: 1999    Years since quitting: 22.9  . Smokeless tobacco: Never Used  Substance and Sexual Activity  . Alcohol use: Yes    Alcohol/week: 2.0 standard drinks    Types: 2 Glasses of wine per week    Comment: per night  . Drug use: No  . Sexual activity: Not Currently  Other Topics Concern  . Not on file  Social History Narrative  . Not on  file   Social Determinants of Health   Financial Resource Strain:   . Difficulty of Paying Living Expenses: Not on file  Food Insecurity:   . Worried About Programme researcher, broadcasting/film/video in the Last Year: Not on file  . Ran Out of Food in the Last Year: Not on file  Transportation Needs:   . Lack of Transportation (Medical): Not on file  . Lack of Transportation (Non-Medical): Not on file  Physical Activity:   . Days of Exercise per Week: Not on file  . Minutes of Exercise per Session: Not on file  Stress:   . Feeling of Stress : Not on file  Social Connections:   . Frequency of Communication with Friends and Family: Not on file  . Frequency of Social Gatherings with Friends and Family: Not on file  . Attends Religious Services: Not on file  . Active Member of Clubs or Organizations: Not on file  . Attends Banker Meetings: Not on file  . Marital Status: Not on file    Family History  Problem Relation Age of Onset  . Cancer Father  Health Maintenance  Topic Date Due  . Hepatitis C Screening  Never done  . COLONOSCOPY  Never done  . MAMMOGRAM  08/12/2021  . TETANUS/TDAP  08/24/2026  . INFLUENZA VACCINE  Completed  . DEXA SCAN  Completed  . COVID-19 Vaccine  Completed  . PNA vac Low Risk Adult  Completed     ----------------------------------------------------------------------------------------------------------------------------------------------------------------------------------------------------------------- Physical Exam BP (!) 154/66 (BP Location: Left Arm, Patient Position: Sitting, Cuff Size: Small)   Pulse 70   Wt 129 lb 11.2 oz (58.8 kg)   SpO2 100%   BMI 25.46 kg/m   Physical Exam Constitutional:      Appearance: Normal appearance.  Musculoskeletal:     Comments: Tenderness to palpation around mid buttock. Greater trochanter is non-tender.  ROM of hip is normal.  Mild pain with SLR and FABER in the buttock.  No localization to the lower back or  SI joint.    Neurological:     General: No focal deficit present.     Mental Status: She is alert.  Psychiatric:        Mood and Affect: Mood normal.        Behavior: Behavior normal.     ------------------------------------------------------------------------------------------------------------------------------------------------------------------------------------------------------------------- Assessment and Plan  Strain of right piriformis muscle Start meloxicam 7.5mg  1-2x per day as needed.  She may continue to use ice/heat as needed.  Given handout for home stretches.  Instructed to follow up if not improving in 3-4 weeks.    Meds ordered this encounter  Medications  . meloxicam (MOBIC) 7.5 MG tablet    Sig: Take 1 tablet (7.5 mg total) by mouth 2 (two) times daily as needed for pain.    Dispense:  30 tablet    Refill:  0    No follow-ups on file.    This visit occurred during the SARS-CoV-2 public health emergency.  Safety protocols were in place, including screening questions prior to the visit, additional usage of staff PPE, and extensive cleaning of exam room while observing appropriate contact time as indicated for disinfecting solutions.

## 2020-04-16 MED ORDER — VITAMIN D (ERGOCALCIFEROL) 1.25 MG (50000 UNIT) PO CAPS: 50000.0000 [IU] | ORAL_CAPSULE | ORAL | 0 refills | Status: DC

## 2020-04-29 DIAGNOSIS — S0501XA Injury of conjunctiva and corneal abrasion without foreign body, right eye, initial encounter: Secondary | ICD-10-CM | POA: Diagnosis not present

## 2020-05-05 ENCOUNTER — Ambulatory Visit: Payer: Medicare PPO | Admitting: Medical-Surgical

## 2020-05-05 DIAGNOSIS — Z20828 Contact with and (suspected) exposure to other viral communicable diseases: Secondary | ICD-10-CM | POA: Diagnosis not present

## 2020-05-07 ENCOUNTER — Ambulatory Visit: Payer: Medicare PPO | Admitting: Family Medicine

## 2020-05-14 ENCOUNTER — Other Ambulatory Visit: Payer: Self-pay

## 2020-05-14 ENCOUNTER — Encounter: Payer: Self-pay | Admitting: Family Medicine

## 2020-05-14 ENCOUNTER — Ambulatory Visit (INDEPENDENT_AMBULATORY_CARE_PROVIDER_SITE_OTHER): Payer: Medicare PPO | Admitting: Family Medicine

## 2020-05-14 DIAGNOSIS — S76311D Strain of muscle, fascia and tendon of the posterior muscle group at thigh level, right thigh, subsequent encounter: Secondary | ICD-10-CM

## 2020-05-14 DIAGNOSIS — F5101 Primary insomnia: Secondary | ICD-10-CM | POA: Diagnosis not present

## 2020-05-14 NOTE — Progress Notes (Signed)
Amanda Schmitt - 75 y.o. female MRN [id number]  Date of birth: 31-Aug-1945  Subjective Chief Complaint  Patient presents with  . Hip Pain    HPI Amanda Schmitt is a 75 y.o. female here today for follow up of insomnia and hip pain.  She reports that insomnia is doing well with change to ambien CR 6.25mg  qhs.  She denies side effects related to medication.    She does continue to have hip pain.  Pain was more over buttock area.  Given home exercises and meloxicam.  Could not tolerate meloxicam due to GI upset.  Still with pain in the buttock area but now with pain in the lateral hips as well.    ROS:  A comprehensive ROS was completed and negative except as noted per HPI  Allergies  Allergen Reactions  . Codeine Nausea Only  . Nitrofuran Derivatives   . Tramadol Nausea Only    Past Medical History:  Diagnosis Date  . Depression     Past Surgical History:  Procedure Laterality Date  . ABDOMINAL HYSTERECTOMY    . ABDOMINAL HYSTERECTOMY    . Bowel blockage    . BOWEL RESECTION    . CESAREAN SECTION      Social History   Socioeconomic History  . Marital status: Widowed    Spouse name: Not on file  . Number of children: Not on file  . Years of education: Not on file  . Highest education level: Not on file  Occupational History  . Not on file  Tobacco Use  . Smoking status: Former Smoker    Packs/day: 0.50    Years: 26.00    Pack years: 13.00    Quit date: 1999    Years since quitting: 23.0  . Smokeless tobacco: Never Used  Substance and Sexual Activity  . Alcohol use: Yes    Alcohol/week: 2.0 standard drinks    Types: 2 Glasses of wine per week    Comment: per night  . Drug use: No  . Sexual activity: Not Currently  Other Topics Concern  . Not on file  Social History Narrative  . Not on file   Social Determinants of Health   Financial Resource Strain: Not on file  Food Insecurity: Not on file  Transportation Needs: Not on file  Physical Activity: Not on  file  Stress: Not on file  Social Connections: Not on file    Family History  Problem Relation Age of Onset  . Cancer Father     Health Maintenance  Topic Date Due  . Hepatitis C Screening  Never done  . COLONOSCOPY (Pts 45-2yrs Insurance coverage will need to be confirmed)  Never done  . MAMMOGRAM  08/12/2021  . TETANUS/TDAP  08/24/2026  . INFLUENZA VACCINE  Completed  . DEXA SCAN  Completed  . COVID-19 Vaccine  Completed  . PNA vac Low Risk Adult  Completed     ----------------------------------------------------------------------------------------------------------------------------------------------------------------------------------------------------------------- Physical Exam BP (!) 122/58 (BP Location: Left Arm, Patient Position: Sitting, Cuff Size: Small)   Pulse 72   Temp (!) 97.5 F (36.4 C)   Wt 128 lb (58.1 kg)   SpO2 98%   BMI 25.13 kg/m   Physical Exam Constitutional:      Appearance: Normal appearance.  Eyes:     General: No scleral icterus. Cardiovascular:     Rate and Rhythm: Normal rate and regular rhythm.  Pulmonary:     Effort: Pulmonary effort is normal.     Breath  sounds: Normal breath sounds.  Musculoskeletal:     Cervical back: Neck supple.  Neurological:     General: No focal deficit present.     Mental Status: She is alert.  Psychiatric:        Mood and Affect: Mood normal.        Behavior: Behavior normal.     ------------------------------------------------------------------------------------------------------------------------------------------------------------------------------------------------------------------- Assessment and Plan  Strain of right piriformis muscle Hip pain in buttock still comes and goes but now having lateral hip pain.  She will plan to schedule with Dr. [name] for evaluation of this.   Primary insomnia She is doing well with Ambien CR at current strength, will continue.    No orders of the  defined types were placed in this encounter.   No follow-ups on file.    This visit occurred during the SARS-CoV-2 public health emergency.  Safety protocols were in place, including screening questions prior to the visit, additional usage of staff PPE, and extensive cleaning of exam room while observing appropriate contact time as indicated for disinfecting solutions.

## 2020-05-14 NOTE — Assessment & Plan Note (Signed)
Hip pain in buttock still comes and goes but now having lateral hip pain.  She will plan to schedule with Dr. [name] for evaluation of this.

## 2020-05-14 NOTE — Assessment & Plan Note (Signed)
She is doing well with Ambien CR at current strength, will continue.

## 2020-05-16 ENCOUNTER — Other Ambulatory Visit: Payer: Self-pay | Admitting: Family Medicine

## 2020-05-16 ENCOUNTER — Ambulatory Visit (INDEPENDENT_AMBULATORY_CARE_PROVIDER_SITE_OTHER): Payer: Medicare PPO

## 2020-05-16 ENCOUNTER — Other Ambulatory Visit: Payer: Self-pay

## 2020-05-16 ENCOUNTER — Ambulatory Visit (INDEPENDENT_AMBULATORY_CARE_PROVIDER_SITE_OTHER): Payer: Medicare PPO | Admitting: Sports Medicine

## 2020-05-16 DIAGNOSIS — M7061 Trochanteric bursitis, right hip: Secondary | ICD-10-CM

## 2020-05-16 DIAGNOSIS — M1612 Unilateral primary osteoarthritis, left hip: Secondary | ICD-10-CM | POA: Diagnosis not present

## 2020-05-16 DIAGNOSIS — M7062 Trochanteric bursitis, left hip: Secondary | ICD-10-CM

## 2020-05-16 DIAGNOSIS — M25551 Pain in right hip: Secondary | ICD-10-CM | POA: Diagnosis not present

## 2020-05-16 DIAGNOSIS — M5136 Other intervertebral disc degeneration, lumbar region: Secondary | ICD-10-CM

## 2020-05-16 DIAGNOSIS — M545 Low back pain, unspecified: Secondary | ICD-10-CM | POA: Diagnosis not present

## 2020-05-16 MED ORDER — PREDNISONE 50 MG PO TABS: ORAL_TABLET | ORAL | 0 refills | Status: DC

## 2020-05-16 NOTE — Progress Notes (Signed)
    Procedures performed today:    None.  Independent interpretation of notes and tests performed by another provider:   None.  Brief History, Exam, Impression, and Recommendations:    Trochanteric bursitis of both hips Amanda Schmitt is a very pleasant 75 year old female, she has bilateral hip pain, left worse than right, pain over the greater trochanters with difficulty laying on the ipsilateral side as well as the piriformis. I think her primary diagnosis is bilateral trochanteric bursitis however there is most likely an element of lumbar spinal stenosis as well. Adding 5 days of prednisone as oral and over-the-counter analgesics and NSAIDs have failed, x-rays of her hips, lumbar spine and formal physical therapy focusing on trochanteric bursitis as well as lumbar spinal stenosis, return to see me in 6 weeks, we will do injections if no better, she does have a trip coming up to [place] in May so needs to be recovered by then.    ___________________________________________ Ihor Austin. [name], M.D., ABFM., CAQSM. Primary Care and Sports Medicine [facility]  Adjunct Instructor of Family Medicine  [employer]

## 2020-05-16 NOTE — Assessment & Plan Note (Signed)
Amanda Schmitt is a very pleasant 75 year old female, she has bilateral hip pain, left worse than right, pain over the greater trochanters with difficulty laying on the ipsilateral side as well as the piriformis. I think her primary diagnosis is bilateral trochanteric bursitis however there is most likely an element of lumbar spinal stenosis as well. Adding 5 days of prednisone as oral and over-the-counter analgesics and NSAIDs have failed, x-rays of her hips, lumbar spine and formal physical therapy focusing on trochanteric bursitis as well as lumbar spinal stenosis, return to see me in 6 weeks, we will do injections if no better, she does have a trip coming up to [facility] in May so needs to be recovered by then.

## 2020-05-19 ENCOUNTER — Telehealth: Payer: Self-pay

## 2020-05-19 DIAGNOSIS — M7061 Trochanteric bursitis, right hip: Secondary | ICD-10-CM

## 2020-05-19 DIAGNOSIS — M7062 Trochanteric bursitis, left hip: Secondary | ICD-10-CM

## 2020-05-19 MED ORDER — PREDNISONE 50 MG PO TABS: ORAL_TABLET | ORAL | 0 refills | Status: DC

## 2020-05-19 NOTE — Telephone Encounter (Signed)
Done

## 2020-05-19 NOTE — Telephone Encounter (Signed)
Patient aware.

## 2020-05-19 NOTE — Telephone Encounter (Signed)
Patient called to report that her pharmacy is out of prednisone and doesn't know when they will be getting any more in. She is miserable. Please advise.

## 2020-05-19 NOTE — Telephone Encounter (Signed)
Patient called back and requested the medication be sent to [facility] in [place].

## 2020-05-20 ENCOUNTER — Ambulatory Visit: Payer: Medicare PPO

## 2020-05-20 ENCOUNTER — Other Ambulatory Visit: Payer: Self-pay | Admitting: Family Medicine

## 2020-05-20 MED ORDER — ESCITALOPRAM OXALATE 10 MG PO TABS: 10.0000 mg | ORAL_TABLET | Freq: Every day | ORAL | 2 refills | Status: DC

## 2020-05-20 MED ORDER — PREDNISONE 50 MG PO TABS: ORAL_TABLET | ORAL | 0 refills | Status: DC

## 2020-05-21 ENCOUNTER — Other Ambulatory Visit: Payer: Self-pay | Admitting: Family Medicine

## 2020-05-22 ENCOUNTER — Telehealth: Payer: Self-pay | Admitting: Family Medicine

## 2020-05-22 ENCOUNTER — Other Ambulatory Visit: Payer: Self-pay | Admitting: Family Medicine

## 2020-05-22 NOTE — Telephone Encounter (Signed)
Called pharmacy and gave them the rx verbally because they did not receive it yesterday.  [facility] notifying pt as well.

## 2020-05-22 NOTE — Telephone Encounter (Signed)
Patient called and wanted me to send a message stating that she is having trouble getting Ambien from the pharmacy ([facility] on [address]). AM

## 2020-05-22 NOTE — Telephone Encounter (Signed)
Please check on Rx.  Sent in on 1/11.

## 2020-05-23 ENCOUNTER — Other Ambulatory Visit: Payer: Self-pay | Admitting: Family Medicine

## 2020-05-23 ENCOUNTER — Other Ambulatory Visit: Payer: Self-pay

## 2020-05-23 ENCOUNTER — Ambulatory Visit (INDEPENDENT_AMBULATORY_CARE_PROVIDER_SITE_OTHER): Payer: Medicare PPO | Admitting: Family Medicine

## 2020-05-23 VITALS — BP 132/67 | HR 65 | Temp 97.8°F | Resp 16 | Ht 60.0 in | Wt 128.0 lb

## 2020-05-23 DIAGNOSIS — M8589 Other specified disorders of bone density and structure, multiple sites: Secondary | ICD-10-CM

## 2020-05-23 DIAGNOSIS — Z0000 Encounter for general adult medical examination without abnormal findings: Secondary | ICD-10-CM | POA: Diagnosis not present

## 2020-05-23 DIAGNOSIS — E782 Mixed hyperlipidemia: Secondary | ICD-10-CM

## 2020-05-23 DIAGNOSIS — E559 Vitamin D deficiency, unspecified: Secondary | ICD-10-CM

## 2020-05-23 NOTE — Progress Notes (Signed)
MEDICARE ANNUAL WELLNESS VISIT  05/23/2020  Subjective:  Amanda Schmitt C Schmitt is a 75 y.o. female patient of Everrett CoombeMatthews, Cody, DO who had a Medicare Annual Wellness Visit today. [name] is Retired and lives alone. she has 2 children. she reports that she is socially active and does interact with friends/family regularly. she is minimally physically active and enjoys painting furniture, reading and volunteering at the hospital twice a week.  Patient Care Team: Everrett CoombeMatthews, Cody, DO as PCP - General (Family Medicine)  Advanced Directives 05/23/2020 09/27/2019  Does Patient Have a Medical Advance Directive? Yes Yes  Type of Estate agentAdvance Directive Healthcare Power of [place];Living will Healthcare Power of [place];Living will;Out of facility DNR (pink MOST or yellow form)  Does patient want to make changes to medical advance directive? No - Patient declined No - Patient declined  Copy of Healthcare Power of Attorney in Chart? Yes - validated most recent copy scanned in chart (See row information) [facility]-    Hospital Utilization Over the Past 12 Months: # of hospitalizations or ER visits: 0 # of surgeries: 0  Review of Systems    Patient reports that her overall health is unchanged when compared to last year.  Review of Systems: History obtained from chart review and the patient  All other systems negative.  Pain Assessment Pain : 0-10 Pain Score: 3  Pain Type: Acute pain Pain Location: Hip Pain Orientation: Right,Left Pain Descriptors / Indicators: Aching Pain Onset: More than a month ago Pain Frequency: Intermittent Pain Relieving Factors: medication-tylenol, motrin Effect of Pain on Daily Activities: none  Pain Relieving Factors: medication-tylenol, motrin  Current Medications & Allergies (verified) Allergies as of 05/23/2020      Reactions   Codeine Nausea Only   Nitrofuran Derivatives    Tramadol Nausea Only      Medication List       Accurate as of May 23, 2020  1:50 PM. If  you have any questions, ask your nurse or doctor.        clonazePAM 0.5 MG tablet Commonly known as: KLONOPIN Take 1 tablet (0.5 mg total) by mouth 2 (two) times daily as needed for anxiety.   escitalopram 10 MG tablet Commonly known as: LEXAPRO Take 1 tablet (10 mg total) by mouth daily.   predniSONE 50 MG tablet Commonly known as: DELTASONE One tab PO daily for 5 days.   Vitamin D (Ergocalciferol) 1.25 MG (50000 UNIT) Caps capsule Commonly known as: DRISDOL TAKE 1 CAPSULE (50,000 UNITS TOTAL) BY MOUTH EVERY 7 (SEVEN) DAYS   zolpidem 6.25 MG CR tablet Commonly known as: AMBIEN CR TAKE 1 TABLET BY MOUTH AT BEDTIME AS NEEDED FOR SLEEP.       History (reviewed): Past Medical History:  Diagnosis Date  . Depression    Past Surgical History:  Procedure Laterality Date  . ABDOMINAL HYSTERECTOMY    . ABDOMINAL HYSTERECTOMY    . Bowel blockage    . BOWEL RESECTION    . CESAREAN SECTION     Family History  Problem Relation Age of Onset  . Cancer Father        un[place]in   Social History   Socioeconomic History  . Marital status: Widowed    Spouse name: Not on file  . Number of children: 2  . Years of education: 16  . Highest education level: Bachelor's degree (e.g., BA, AB, BS)  Occupational History  . Occupation: Retired    Comment: Runner, broadcasting/film/videoTeacher  Tobacco Use  . Smoking status:  Former Smoker    Packs/day: 0.50    Years: 26.00    Pack years: 13.00    Quit date: 1999    Years since quitting: 23.0  . Smokeless tobacco: Never Used  Substance and Sexual Activity  . Alcohol use: Yes    Alcohol/week: 2.0 standard drinks    Types: 2 Glasses of wine per week    Comment: per night  . Drug use: No  . Sexual activity: Not Currently  Other Topics Concern  . Not on file  Social History Narrative   Volunteers at the hospital two days out of the week. Enjoys doing crossword puzzles, reading and painting furniture.   Social Determinants of Health   Financial Resource  Strain: Low Risk   . Difficulty of Paying Living Expenses: Not hard at all  Food Insecurity: No Food Insecurity  . Worried About Programme researcher, broadcasting/film/video in the Last Year: Never true  . Ran Out of Food in the Last Year: Never true  Transportation Needs: No Transportation Needs  . Lack of Transportation (Medical): No  . Lack of Transportation (Non-Medical): No  Physical Activity: Insufficiently Active  . Days of Exercise per Week: 3 days  . Minutes of Exercise per Session: 30 min  Stress: No Stress Concern Present  . Feeling of Stress : Not at all  Social Connections: Socially Isolated  . Frequency of Communication with Friends and Family: More than three times a week  . Frequency of Social Gatherings with Friends and Family: Once a week  . Attends Religious Services: Never  . Active Member of Clubs or Organizations: No  . Attends Banker Meetings: Never  . Marital Status: Widowed    Activities of Daily Living In your present state of health, do you have any difficulty performing the following activities: 05/23/2020  Hearing? N  Vision? N  Difficulty concentrating or making decisions? N  Walking or climbing stairs? N  Dressing or bathing? N  Doing errands, shopping? N  Preparing Food and eating ? N  Using the Toilet? N  In the past six months, have you accidently leaked urine? N  Do you have problems with loss of bowel control? N  Managing your Medications? N  Managing your Finances? N  Housekeeping or managing your Housekeeping? N  Some recent data might be hidden    Patient Education/Literacy How often do you need to have someone help you when you read instructions, pamphlets, or other written materials from your doctor or pharmacy?: 1 - Never What is the last grade level you completed in school?: bachelor's degree  Exercise Current Exercise Habits: Home exercise routine, Type of exercise: walking, Time (Minutes): 30, Frequency (Times/Week): 3, Weekly Exercise  (Minutes/Week): 90, Intensity: Mild  Diet Patient reports consuming 3 meals a day and 0 snack(s) a day Patient reports that her primary diet is: Regular Patient reports that she does have regular access to food.   Depression Screen PHQ 2/9 Scores 05/23/2020 02/05/2020 10/16/2019 09/27/2019  PHQ - 2 Score 0 1 1 0  PHQ- 9 Score 0 8 5 0     Fall Risk Fall Risk  05/23/2020 02/05/2020  Falls in the past year? 0 0  Number falls in past yr: 0 0  Injury with Fall? 0 0  Risk for fall due to : No Fall Risks No Fall Risks     Objective:   BP 132/67 (BP Location: Right Arm, Patient Position: Sitting, Cuff Size: Normal)   Pulse 65  Temp 97.8 F (36.6 C) (Oral)   Resp 16   Ht 5' (1.524 m)   Wt 128 lb (58.1 kg)   SpO2 100%   BMI 25.00 kg/m   Last Weight  Most recent update: 05/23/2020  1:02 PM   Weight  58.1 kg (128 lb)            Body mass index is 25 kg/m.  Hearing/Vision  . [place] did not have difficulty with hearing/understanding during the face-to-face interview . Beautiful did not have difficulty with her vision during the face-to-face interview . Reports that she has had a formal eye exam by an eye care professional within the past year . Reports that she has not had a formal hearing evaluation within the past year  Cognitive Function: 6CIT Screen 05/23/2020  What Year? 0 points  What month? 0 points  What time? 0 points  Count back from 20 0 points  Months in reverse 0 points  Repeat phrase 0 points  Total Score 0    Normal Cognitive Function Screening: Yes (Normal:0-7, Significant for Dysfunction: >8)  Immunization & Health Maintenance Record Immunization History  Administered Date(s) Administered  . Fluad Quad(high Dose 65+) 02/05/2020  . Influenza, High Dose Seasonal PF 02/13/2015, 03/18/2016, 02/02/2017, 02/10/2018, 01/15/2019  . PFIZER SARS-COV-2 Vaccination 06/07/2019, 07/01/2019, 02/20/2020  . Pneumococcal Conjugate-13 02/13/2015  . Pneumococcal Polysaccharide-23  12/31/2011  . Td 06/25/2002  . Tdap 08/23/2016    Health Maintenance  Topic Date Due  . Hepatitis C Screening  05/23/2021 (Originally [birth year])  . COLONOSCOPY (Pts 45-62yrs Insurance coverage will need to be confirmed)  06/09/2021 (Originally 06/23/1990)  . MAMMOGRAM  08/12/2021  . TETANUS/TDAP  08/24/2026  . INFLUENZA VACCINE  Completed  . DEXA SCAN  Completed  . COVID-19 Vaccine  Completed  . PNA vac Low Risk Adult  Completed       Assessment  This is a routine wellness examination for Amanda Schmitt.  Health Maintenance: Due or Overdue Hep C screening  HIV Screening Shingles vaccine  Amanda Schmitt does not need a referral for Community Assistance: Care Management:   no Social Work:    no Prescription Assistance:  no Nutrition/Diabetes Education:  no   Plan:  Personalized Goals Goals Addressed              This Visit's Progress   .  Patient Stated (pt-stated)        05/23/2020 AWV Goal: Improved Nutrition/Diet  . Patient will verbalize understanding that diet plays an important role in overall health and that a poor diet is a risk factor for many chronic medical conditions.  . Over the next year, patient will improve self management of their diet. . Patient will utilize available community resources to help with food acquisition if needed. . Patient will work with nutrition specialist if a referral was made       Personalized Health Maintenance & Screening Recommendations  Hep C screening HIV Screening  Lung Cancer Screening Recommended: no (Low Dose CT Chest recommended if Age 73-80 years, 30 pack-year currently smoking OR have quit w/in past 15 years) Hepatitis C Screening recommended: yes HIV Screening recommended: yes  Advanced Directives: Written information was not given per the patient's request.  Referrals & Orders Orders Placed This Encounter  Procedures  . Cologuard  Cologuard was completed per patient in 2020 and it was negative. She  will bring the results to the office.  Follow-up Plan . Follow-up with Everrett Coombe, DO as  planned . Schedule your next annual wellness exam in one year.   I have personally reviewed and noted the following in the patient's chart:   . Medical and social history . Use of alcohol, tobacco or illicit drugs  . Current medications and supplements . Functional ability and status . Nutritional status . Physical activity . Advanced directives . List of other physicians . Hospitalizations, surgeries, and ER visits in previous 12 months . Vitals . Screenings to include cognitive, depression, and falls . Referrals and appointments  In addition, I have reviewed and discussed with patient certain preventive protocols, quality metrics, and best practice recommendations. A written personalized care plan for preventive services as well as general preventive health recommendations were provided to patient.     Modesto Charon, RN  05/23/2020

## 2020-05-23 NOTE — Patient Instructions (Addendum)
Health Maintenance, Female Adopting a healthy lifestyle and getting preventive care are important in promoting health and wellness. Ask your health care provider about:  The right schedule for you to have regular tests and exams.  Things you can do on your own to prevent diseases and keep yourself healthy. What should I know about diet, weight, and exercise? Eat a healthy diet  Eat a diet that includes plenty of vegetables, fruits, low-fat dairy products, and lean protein.  Do not eat a lot of foods that are high in solid fats, added sugars, or sodium.   Maintain a healthy weight Body mass index (BMI) is used to identify weight problems. It estimates body fat based on height and weight. Your health care provider can help determine your BMI and help you achieve or maintain a healthy weight. Get regular exercise Get regular exercise. This is one of the most important things you can do for your health. Most adults should:  Exercise for at least 150 minutes each week. The exercise should increase your heart rate and make you sweat (moderate-intensity exercise).  Do strengthening exercises at least twice a week. This is in addition to the moderate-intensity exercise.  Spend less time sitting. Even light physical activity can be beneficial. Watch cholesterol and blood lipids Have your blood tested for lipids and cholesterol at 75 years of age, then have this test every 5 years. Have your cholesterol levels checked more often if:  Your lipid or cholesterol levels are high.  You are older than 75 years of age.  You are at high risk for heart disease. What should I know about cancer screening? Depending on your health history and family history, you may need to have cancer screening at various ages. This may include screening for:  Breast cancer.  Cervical cancer.  Colorectal cancer.  Skin cancer.  Lung cancer. What should I know about heart disease, diabetes, and high blood  pressure? Blood pressure and heart disease  High blood pressure causes heart disease and increases the risk of stroke. This is more likely to develop in people who have high blood pressure readings, are of African descent, or are overweight.  Have your blood pressure checked: ? Every 3-5 years if you are 75-35 years of age. ? Every year if you are 75 years old or older. Diabetes Have regular diabetes screenings. This checks your fasting blood sugar level. Have the screening done:  Once every three years after age 75 if you are at a normal weight and have a low risk for diabetes.  More often and at a younger age if you are overweight or have a high risk for diabetes. What should I know about preventing infection? Hepatitis B If you have a higher risk for hepatitis B, you should be screened for this virus. Talk with your health care provider to find out if you are at risk for hepatitis B infection. Hepatitis C Testing is recommended for:  Everyone born from [birth year] through 1965.  Anyone with known risk factors for hepatitis C. Sexually transmitted infections (STIs)  Get screened for STIs, including gonorrhea and chlamydia, if: ? You are sexually active and are younger than 75 years of age. ? You are older than 75 years of age and your health care provider tells you that you are at risk for this type of infection. ? Your sexual activity has changed since you were last screened, and you are at increased risk for chlamydia or gonorrhea. Ask your health care  provider if you are at risk.  Ask your health care provider about whether you are at high risk for HIV. Your health care provider may recommend a prescription medicine to help prevent HIV infection. If you choose to take medicine to prevent HIV, you should first get tested for HIV. You should then be tested every 3 months for as long as you are taking the medicine. Pregnancy  If you are about to stop having your period (premenopausal) and  you may become pregnant, seek counseling before you get pregnant.  Take 400 to 800 micrograms (mcg) of folic acid every day if you become pregnant.  Ask for birth control (contraception) if you want to prevent pregnancy. Osteoporosis and menopause Osteoporosis is a disease in which the bones lose minerals and strength with aging. This can result in bone fractures. If you are 54 years old or older, or if you are at risk for osteoporosis and fractures, ask your health care provider if you should:  Be screened for bone loss.  Take a calcium or vitamin D supplement to lower your risk of fractures.  Be given hormone replacement therapy (HRT) to treat symptoms of menopause. Follow these instructions at home: Lifestyle  Do not use any products that contain nicotine or tobacco, such as cigarettes, e-cigarettes, and chewing tobacco. If you need help quitting, ask your health care provider.  Do not use street drugs.  Do not share needles.  Ask your health care provider for help if you need support or information about quitting drugs. Alcohol use  Do not drink alcohol if: ? Your health care provider tells you not to drink. ? You are pregnant, may be pregnant, or are planning to become pregnant.  If you drink alcohol: ? Limit how much you use to 0-1 drink a day. ? Limit intake if you are breastfeeding.  Be aware of how much alcohol is in your drink. In the [place] one drink equals one 12 oz bottle of beer (355 mL), one 5 oz glass of wine (148 mL), or one 1 oz glass of hard liquor (44 mL). General instructions  Schedule regular health, dental, and eye exams.  Stay current with your vaccines.  Tell your health care provider if: ? You often feel depressed. ? You have ever been abused or do not feel safe at home. Summary  Adopting a healthy lifestyle and getting preventive care are important in promoting health and wellness.  Follow your health care provider's instructions about healthy  diet, exercising, and getting tested or screened for diseases.  Follow your health care provider's instructions on monitoring your cholesterol and blood pressure. This information is not intended to replace advice given to you by your health care provider. Make sure you discuss any questions you have with your health care provider. Document Revised: 04/19/2018 Document Reviewed: 04/19/2018 Elsevier Patient Education  2021 Elsevier Inc.   MEDICARE [place] VISIT Health Maintenance Summary and Written Plan of Care  [name] ,  Thank you for allowing me to perform your Medicare Annual Wellness Visit and for your ongoing commitment to your health.   Health Maintenance & Immunization History Health Maintenance  Topic Date Due  . Hepatitis C Screening  05/23/2021 (Originally 21-Jan-1946)  . COLONOSCOPY (Pts 45-32yrs Insurance coverage will need to be confirmed)  06/09/2021 (Originally 06/23/1990)  . MAMMOGRAM  08/12/2021  . TETANUS/TDAP  08/24/2026  . INFLUENZA VACCINE  Completed  . DEXA SCAN  Completed  . COVID-19 Vaccine  Completed  .  PNA vac Low Risk Adult  Completed   Immunization History  Administered Date(s) Administered  . Fluad Quad(high Dose 65+) 02/05/2020  . Influenza, High Dose Seasonal PF 02/13/2015, 03/18/2016, 02/02/2017, 02/10/2018, 01/15/2019  . PFIZER SARS-COV-2 Vaccination 06/07/2019, 07/01/2019, 02/20/2020  . Pneumococcal Conjugate-13 02/13/2015  . Pneumococcal Polysaccharide-23 12/31/2011  . Td 06/25/2002  . Tdap 08/23/2016    These are the patient goals that we discussed: Goals Addressed              This Visit's Progress   .  Patient Stated (pt-stated)        05/23/2020 AWV Goal: Improved Nutrition/Diet  . Patient will verbalize understanding that diet plays an important role in overall health and that a poor diet is a risk factor for many chronic medical conditions.  . Over the next year, patient will improve self management of their  diet. . Patient will utilize available community resources to help with food acquisition if needed. . Patient will work with nutrition specialist if a referral was made         This is a list of Health Maintenance Items that are overdue or due now: Marland Kitchen Follow-up with Everrett Coombe, DO as planned . Schedule your next annual wellness exam in one year.  Orders/Referrals Placed Today: Orders Placed This Encounter  Procedures  . Cologuard    This external order was created through the Results Console.    Follow-up Plan . Follow-up with Everrett Coombe, DO as planned . Schedule your next annual wellness exam in one year.

## 2020-05-27 ENCOUNTER — Ambulatory Visit (INDEPENDENT_AMBULATORY_CARE_PROVIDER_SITE_OTHER): Payer: Medicare PPO | Admitting: Sports Medicine

## 2020-05-27 ENCOUNTER — Ambulatory Visit (INDEPENDENT_AMBULATORY_CARE_PROVIDER_SITE_OTHER): Payer: Medicare PPO

## 2020-05-27 ENCOUNTER — Other Ambulatory Visit: Payer: Self-pay

## 2020-05-27 DIAGNOSIS — M7061 Trochanteric bursitis, right hip: Secondary | ICD-10-CM

## 2020-05-27 DIAGNOSIS — M7062 Trochanteric bursitis, left hip: Secondary | ICD-10-CM | POA: Diagnosis not present

## 2020-05-27 MED ORDER — GABAPENTIN 300 MG PO CAPS: ORAL_CAPSULE | ORAL | 3 refills | Status: DC

## 2020-05-27 NOTE — Assessment & Plan Note (Addendum)
Amanda Schmitt returns, she is a pleasant 75 year old female with bilateral hip pain, left worse than right over the greater trochanters, today we performed bilateral trochanteric bursa injections after failure of prednisone. Return to see me in 1 month, and if insufficient improvement we will consider further evaluation of her lumbar spine with an MRI. In the meantime I am going to add some Neurontin for her to take at night. She does have a trip coming up to [place] in May.

## 2020-05-27 NOTE — Progress Notes (Signed)
    Procedures performed today:    Procedure: Real-time Ultrasound Guided injection of the left greater trochanteric bursa Device: Samsung HS60  Verbal informed consent obtained.  Time-out conducted.  Noted no overlying erythema, induration, or other signs of local infection.  Skin prepped in a sterile fashion.  Local anesthesia: Topical Ethyl chloride.  With sterile technique and under real time ultrasound guidance: 1 cc Kenalog 40, 2 cc lidocaine, 2 cc bupivacaine injected easily Completed without difficulty  Advised to call if fevers/chills, erythema, induration, drainage, or persistent bleeding.  Images permanently stored and available for review in PACS.  Impression: Technically successful ultrasound guided injection.  Procedure: Real-time Ultrasound Guided injection of the right greater trochanteric bursa Device: Samsung HS60  Verbal informed consent obtained.  Time-out conducted.  Noted no overlying erythema, induration, or other signs of local infection.  Skin prepped in a sterile fashion.  Local anesthesia: Topical Ethyl chloride.  With sterile technique and under real time ultrasound guidance: 1 cc Kenalog 40, 2 cc lidocaine, 2 cc bupivacaine injected easily Completed without difficulty  Advised to call if fevers/chills, erythema, induration, drainage, or persistent bleeding.  Images permanently stored and available for review in PACS.  Impression: Technically successful ultrasound guided injection.  Independent interpretation of notes and tests performed by another provider:   None.  Brief History, Exam, Impression, and Recommendations:    Trochanteric bursitis of both hips Amanda Schmitt returns, she is a pleasant 75 year old female with bilateral hip pain, left worse than right over the greater trochanters, today we performed bilateral trochanteric bursa injections after failure of prednisone. Return to see me in 1 month, and if insufficient improvement we will consider  further evaluation of her lumbar spine with an MRI. In the meantime I am going to add some Neurontin for her to take at night. She does have a trip coming up to [facility] in May.    ___________________________________________ Ihor Austin. [name], M.D., ABFM., CAQSM. Primary Care and Sports Medicine [facility]  Adjunct Instructor of Family Medicine  [employer]

## 2020-05-27 NOTE — Telephone Encounter (Signed)
Please advise 

## 2020-05-28 ENCOUNTER — Encounter: Payer: Self-pay | Admitting: Physical Therapy

## 2020-05-28 ENCOUNTER — Ambulatory Visit (INDEPENDENT_AMBULATORY_CARE_PROVIDER_SITE_OTHER): Payer: Medicare PPO | Admitting: Physical Therapy

## 2020-05-28 DIAGNOSIS — M6281 Muscle weakness (generalized): Secondary | ICD-10-CM | POA: Diagnosis not present

## 2020-05-28 DIAGNOSIS — M25551 Pain in right hip: Secondary | ICD-10-CM

## 2020-05-28 DIAGNOSIS — M25552 Pain in left hip: Secondary | ICD-10-CM | POA: Diagnosis not present

## 2020-05-28 NOTE — Therapy (Signed)
[facility] [place] [address]  [address] [place], [place], [address] Phone: [phone]   Fax:  [phone]  Physical Therapy Evaluation  Patient Details  Name: Amanda Schmitt MRN: [id number] Date of Birth: [birth year] Referring Provider (PT): thenkkekandam   Encounter Date: 05/28/2020   PT End of Session - 05/28/20 1141    Visit Number 1    Number of Visits 6    Date for PT Re-Evaluation 07/09/20    PT Start Time 1100    PT Stop Time 1138    PT Time Calculation (min) 38 min    Activity Tolerance Patient tolerated treatment well    Behavior During Therapy [facility] for tasks assessed/performed           Past Medical History:  Diagnosis Date  . Depression     Past Surgical History:  Procedure Laterality Date  . ABDOMINAL HYSTERECTOMY    . ABDOMINAL HYSTERECTOMY    . Bowel blockage    . BOWEL RESECTION    . CESAREAN SECTION      There were no vitals filed for this visit.    Subjective Assessment - 05/28/20 1104    Subjective Pt states she has been having bilat hip pain x 3 weeks. MD tried prednisone with no relief, then injections yesterday which helped. Pain worse when sitting and laying on her side, improves with movement, injections.    Limitations Other (comment)   sleeping   Diagnostic tests x ray shows no fracture, spinal stenosis    Patient Stated Goals lay on my side without pain, sleep without pain    Currently in Pain? No/denies              [facility] PT Assessment - 05/28/20 0001      Assessment   Medical Diagnosis trochanteric bursitis bilat    Referring Provider (PT) [email or web address]    Onset Date/Surgical Date 04/30/20      Balance Screen   Has the patient fallen in the past 6 months No      Prior Function   Level of Independence Independent      Observation/Other Assessments   Focus on Therapeutic Outcomes (FOTO)  91 functional status measure      ROM / Strength   AROM / PROM / Strength AROM;Strength       AROM   Overall AROM Comments bilat hips [facility]      Strength   Strength Assessment Site Hip    Right/Left Hip Right;Left    Right Hip Flexion 4+/5    Right Hip Extension 3+/5    Right Hip ABduction 4-/5    Left Hip Flexion 4+/5    Left Hip Extension 3+/5    Left Hip ABduction 3+/5      Flexibility   Soft Tissue Assessment /Muscle Length yes    ITB increased trigger points bilat      Palpation   Palpation comment TTP bilat greater trochanter.  Multiple trigger points bilat ITB      Special Tests    Special Tests Hip Special Tests    Hip Special Tests  Ober's Test      Ober's Test   Findings Negative    Side --   bilat                     Objective measurements completed on examination: See above findings.       [facility] Adult PT Treatment/Exercise - 05/28/20 0001  Exercises   Exercises Knee/Hip      Knee/Hip Exercises: Stretches   ITB Stretch Both;2 reps;20 seconds      Knee/Hip Exercises: Supine   Bridges Strengthening;Both;2 sets;10 reps    Bridges with Clamshell Strengthening;Both;2 sets;10 reps      Knee/Hip Exercises: Sidelying   Hip ABduction Strengthening;Both;2 sets;10 reps    Clams 2 x 10 bilat                  PT Education - 05/28/20 1141    Education Details PT POC and goals, HEP    Person(s) Educated Patient    Methods Explanation;Demonstration;Handout    Comprehension Returned demonstration;Verbalized understanding               PT Long Term Goals - 05/28/20 1142      PT LONG TERM GOAL #1   Title Pt will be independent with HEP    Time 6    Period Weeks    Status New    Target Date 07/09/20      PT LONG TERM GOAL #2   Title Pt will sleep x 6 hours with pain <= 2/10    Time 6    Period Weeks    Status New    Target Date 07/09/20      PT LONG TERM GOAL #3   Title Pt will increase bilat hip strength to 4+/5 in all directions to perform walking > 2 miles without pain    Time 6    Period Weeks     Status New    Target Date 07/09/20      PT LONG TERM GOAL #4   Title Pt will improve FOTO functional status measure to >91 to show improved functional abilities    Time 6    Period Weeks    Status New    Target Date 07/09/20                  Plan - 05/28/20 1129    Clinical Impression Statement Pt presents with increased bilat hip pain and decreased hip strength. Pt will benefit from skilled PT to address deficits and improve ability to sleep and perform IADLs with decreased pain    Personal Factors and Comorbidities Age    Examination-Activity Limitations Sleep    Stability/Clinical Decision Making Stable/Uncomplicated    Clinical Decision Making Low    Rehab Potential Good    PT Frequency 1x / week    PT Duration 6 weeks    PT Treatment/Interventions Cryotherapy;Electrical Stimulation;Neuromuscular re-education;Therapeutic exercise;Therapeutic activities;Moist Heat;Iontophoresis 4mg /ml Dexamethasone;Patient/family education;Passive range of motion;Dry needling;Taping;Manual techniques    PT Next Visit Plan assess HEP. continue glute and hip strengthening    PT Home Exercise Plan Access Code: [email or web address]    Consulted and Agree with Plan of Care Patient           Patient will benefit from skilled therapeutic intervention in order to improve the following deficits and impairments:  Decreased strength,Pain,Decreased activity tolerance  Visit Diagnosis: Pain in left hip - Plan: PT plan of care cert/re-cert  Pain in right hip - Plan: PT plan of care cert/re-cert  Muscle weakness (generalized) - Plan: PT plan of care cert/re-cert     Problem List Patient Active Problem List   Diagnosis Date Noted  . Trochanteric bursitis of both hips 04/15/2020  . Colon cancer screening 02/05/2020  . GAD (generalized anxiety disorder) 10/16/2019  . Trigeminal neuralgia 09/27/2019  . Palpitations 09/27/2019  . Epigastric  pain 09/27/2019  . Primary insomnia 06/22/2016  . Osteopenia  of multiple sites 08/11/2015  . Mixed hyperlipidemia 08/11/2015  . Major depressive disorder in full remission (HCC) 08/11/2015  . Senile nuclear sclerosis 07/25/2014   Omer Puccinelli, PT  Kenyonna Micek 05/28/2020, 11:51 AM  [facility] [address] [place] [address] [place], [place], [address] Phone: [phone]   Fax:  [phone]  Name: Amanda Schmitt MRN: [id number] Date of Birth: Dec 01, 1945

## 2020-05-28 NOTE — Patient Instructions (Signed)
Access Code: [email or web address] URL: [email or web address] Date: 05/28/2020 Prepared by: [facility]  Exercises Supine Bridge - 1 x daily - 7 x weekly - 3 sets - 10 reps Supine Bridge with Resistance Band - 1 x daily - 7 x weekly - 3 sets - 10 reps Sidelying Hip Abduction - 1 x daily - 7 x weekly - 3 sets - 10 reps Clamshell - 1 x daily - 7 x weekly - 3 sets - 10 reps

## 2020-06-04 ENCOUNTER — Other Ambulatory Visit: Payer: Self-pay

## 2020-06-04 ENCOUNTER — Ambulatory Visit: Payer: Medicare PPO | Admitting: Physical Therapy

## 2020-06-04 ENCOUNTER — Encounter: Payer: Self-pay | Admitting: Physical Therapy

## 2020-06-04 DIAGNOSIS — M6281 Muscle weakness (generalized): Secondary | ICD-10-CM | POA: Diagnosis not present

## 2020-06-04 DIAGNOSIS — M25551 Pain in right hip: Secondary | ICD-10-CM

## 2020-06-04 DIAGNOSIS — M25552 Pain in left hip: Secondary | ICD-10-CM

## 2020-06-04 NOTE — Therapy (Signed)
[facility] [place] [address] [place] [address] [place], [place], [address] Phone: [phone]   Fax:  [phone]  Physical Therapy Treatment  Patient Details  Name: Amanda Schmitt MRN: [id number] Date of Birth: 1945-09-10 Referring Provider (PT): thenkkekandam   Encounter Date: 06/04/2020   PT End of Session - 06/04/20 1100    Visit Number 2    Number of Visits 6    Date for PT Re-Evaluation 07/09/20    PT Start Time 1015    PT Stop Time 1100    PT Time Calculation (min) 45 min    Activity Tolerance Patient tolerated treatment well    Behavior During Therapy [facility] for tasks assessed/performed           Past Medical History:  Diagnosis Date  . Depression     Past Surgical History:  Procedure Laterality Date  . ABDOMINAL HYSTERECTOMY    . ABDOMINAL HYSTERECTOMY    . Bowel blockage    . BOWEL RESECTION    . CESAREAN SECTION      There were no vitals filed for this visit.   Subjective Assessment - 06/04/20 1011    Subjective Pt states that her hips feel "much better". Reports she has been performing HEP daily    Patient Stated Goals lay on my side without pain, sleep without pain    Currently in Pain? No/denies                             [facility] Adult PT Treatment/Exercise - 06/04/20 0001      High Level Balance   High Level Balance Comments 3 way hip at counter x 10 each way 0 UE support,      Knee/Hip Exercises: Aerobic   Nustep x 4 minutes level 5 for warm up      Knee/Hip Exercises: Standing   Other Standing Knee Exercises sidestep with red TB x 2 laps    Other Standing Knee Exercises deadlift 10# kettlebell x 15      Knee/Hip Exercises: Seated   Sit to Sand 10 reps;2 sets      Knee/Hip Exercises: Supine   Bridges Strengthening;2 sets;10 reps    Bridges Limitations then bridge with heelslide x 10 bilat    Bridges with Clamshell Strengthening;2 sets;10 reps      Knee/Hip Exercises: Sidelying    Hip ABduction Strengthening;Both;2 sets;10 reps    Clams 2 x 10 bilat      Manual Therapy   Manual Therapy Soft tissue mobilization    Soft tissue mobilization STM and TPR bilat ITB, foam roll rolling pin bilat ITB                       PT Long Term Goals - 05/28/20 1142      PT LONG TERM GOAL #1   Title Pt will be independent with HEP    Time 6    Period Weeks    Status New    Target Date 07/09/20      PT LONG TERM GOAL #2   Title Pt will sleep x 6 hours with pain <= 2/10    Time 6    Period Weeks    Status New    Target Date 07/09/20      PT LONG TERM GOAL #3   Title Pt will increase bilat hip strength to 4+/5 in all directions to perform walking > 2  miles without pain    Time 6    Period Weeks    Status New    Target Date 07/09/20      PT LONG TERM GOAL #4   Title Pt will improve FOTO functional status measure to >91 to show improved functional abilities    Time 6    Period Weeks    Status New    Target Date 07/09/20                 Plan - 06/04/20 1100    Clinical Impression Statement Pt able to tolerate progression of exercises without increase in symptoms. Some difficulty with SLS balance activities on Lt LE    PT Frequency 1x / week    PT Duration 6 weeks    PT Next Visit Plan continue glute and hip strenghtening, manual for ITB    PT Home Exercise Plan Access Code: [email or web address]    Consulted and Agree with Plan of Care Patient           Patient will benefit from skilled therapeutic intervention in order to improve the following deficits and impairments:     Visit Diagnosis: Pain in right hip  Muscle weakness (generalized)  Pain in left hip     Problem List Patient Active Problem List   Diagnosis Date Noted  . Trochanteric bursitis of both hips 04/15/2020  . Colon cancer screening 02/05/2020  . GAD (generalized anxiety disorder) 10/16/2019  . Trigeminal neuralgia 09/27/2019  . Palpitations 09/27/2019  . Epigastric pain  09/27/2019  . Primary insomnia 06/22/2016  . Osteopenia of multiple sites 08/11/2015  . Mixed hyperlipidemia 08/11/2015  . Major depressive disorder in full remission (HCC) 08/11/2015  . Senile nuclear sclerosis 07/25/2014   Amanda Schmitt, PT  Amanda Schmitt 06/04/2020, 11:02 AM  [facility] [address] [place] [address] [place], [place], [address] Phone: [phone]   Fax:  [phone]  Name: Amanda Schmitt MRN: [id number] Date of Birth: 07/16/1945

## 2020-06-04 NOTE — Patient Instructions (Signed)
Access Code: [email or web address] URL: [email or web address] Date: 06/04/2020 Prepared by: [facility]  Exercises Supine Bridge - 1 x daily - 7 x weekly - 3 sets - 10 reps Supine Bridge with Resistance Band - 1 x daily - 7 x weekly - 3 sets - 10 reps Sidelying Hip Abduction - 1 x daily - 7 x weekly - 3 sets - 10 reps Clamshell - 1 x daily - 7 x weekly - 3 sets - 10 reps Single Leg Balance with Clock Reach - 1 x daily - 7 x weekly - 2 sets - 10 reps Kettlebell Deadlift - 1 x daily - 7 x weekly - 3 sets - 10 reps

## 2020-06-05 DIAGNOSIS — E559 Vitamin D deficiency, unspecified: Secondary | ICD-10-CM | POA: Diagnosis not present

## 2020-06-05 DIAGNOSIS — Z0000 Encounter for general adult medical examination without abnormal findings: Secondary | ICD-10-CM | POA: Diagnosis not present

## 2020-06-05 DIAGNOSIS — E782 Mixed hyperlipidemia: Secondary | ICD-10-CM | POA: Diagnosis not present

## 2020-06-06 LAB — CBC
HCT: 38.9 % (ref 35.0–45.0)
Hemoglobin: 13.4 g/dL (ref 11.7–15.5)
MCH: 34 pg — ABNORMAL HIGH (ref 27.0–33.0)
MCHC: 34.4 g/dL (ref 32.0–36.0)
MCV: 98.7 fL (ref 80.0–100.0)
MPV: 10.7 fL (ref 7.5–12.5)
Platelets: 318 10*3/uL (ref 140–400)
RBC: 3.94 10*6/uL (ref 3.80–5.10)
RDW: 11.5 % (ref 11.0–15.0)
WBC: 9.9 10*3/uL (ref 3.8–10.8)

## 2020-06-06 LAB — COMPLETE METABOLIC PANEL WITH GFR
AG Ratio: 1.7 (calc) (ref 1.0–2.5)
ALT: 17 U/L (ref 6–29)
AST: 21 U/L (ref 10–35)
Albumin: 4.6 g/dL (ref 3.6–5.1)
Alkaline phosphatase (APISO): 68 U/L (ref 37–153)
BUN: 17 mg/dL (ref 7–25)
CO2: 26 mmol/L (ref 20–32)
Calcium: 9.5 mg/dL (ref 8.6–10.4)
Chloride: 99 mmol/L (ref 98–110)
Creat: 0.74 mg/dL (ref 0.60–0.93)
GFR, Est African American: 92 mL/min/{1.73_m2} (ref >=60)
GFR, Est Non African American: 80 mL/min/{1.73_m2} (ref >=60)
Globulin: 2.7 g/dL (calc) (ref 1.9–3.7)
Glucose, Bld: 98 mg/dL (ref 65–99)
Potassium: 4.1 mmol/L (ref 3.5–5.3)
Sodium: 137 mmol/L (ref 135–146)
Total Bilirubin: 0.8 mg/dL (ref 0.2–1.2)
Total Protein: 7.3 g/dL (ref 6.1–8.1)

## 2020-06-06 LAB — VITAMIN D 25 HYDROXY (VIT D DEFICIENCY, FRACTURES): Vit D, 25-Hydroxy: 103 ng/mL — ABNORMAL HIGH (ref 30–100)

## 2020-06-06 LAB — LIPID PANEL
Cholesterol: 281 mg/dL — ABNORMAL HIGH (ref <200)
HDL: 129 mg/dL (ref >=50)
LDL Cholesterol (Calc): 134 mg/dL (calc) — ABNORMAL HIGH
Non-HDL Cholesterol (Calc): 152 mg/dL (calc) — ABNORMAL HIGH (ref <130)
Total CHOL/HDL Ratio: 2.2 (calc) (ref <5.0)
Triglycerides: 81 mg/dL (ref <150)

## 2020-06-10 ENCOUNTER — Encounter: Payer: Self-pay | Admitting: Family Medicine

## 2020-06-11 ENCOUNTER — Encounter: Payer: Medicare PPO | Admitting: Physical Therapy

## 2020-06-13 ENCOUNTER — Ambulatory Visit: Payer: Medicare PPO | Admitting: Physical Therapy

## 2020-06-13 ENCOUNTER — Other Ambulatory Visit: Payer: Self-pay

## 2020-06-13 DIAGNOSIS — M6281 Muscle weakness (generalized): Secondary | ICD-10-CM

## 2020-06-13 DIAGNOSIS — M25552 Pain in left hip: Secondary | ICD-10-CM

## 2020-06-13 DIAGNOSIS — M25551 Pain in right hip: Secondary | ICD-10-CM

## 2020-06-13 DIAGNOSIS — M7061 Trochanteric bursitis, right hip: Secondary | ICD-10-CM

## 2020-06-13 DIAGNOSIS — M7062 Trochanteric bursitis, left hip: Secondary | ICD-10-CM

## 2020-06-13 MED ORDER — PREGABALIN 50 MG PO CAPS: 50.0000 mg | ORAL_CAPSULE | Freq: Three times a day (TID) | ORAL | 3 refills | Status: DC

## 2020-06-13 NOTE — Patient Instructions (Signed)
Access Code: [email or web address] URL: [email or web address] Date: 06/13/2020 Prepared by: [facility] - [facility]  Exercises Kettlebell Deadlift - 1 x daily - 7 x weekly - 1-2 sets - 10 reps Single Leg Balance with Clock Reach - 1 x daily - 7 x weekly - 2 sets - 10 reps Supine Bridge - 1 x daily - 7 x weekly - 1 sets - 10 reps - 5 hold Figure 4 Bridge - 1 x daily - 7 x weekly - 1 sets - 10 reps Supine Hamstring Stretch with Strap - 1 x daily - 7 x weekly - 2 reps - 20 seconds hold Supine ITB Stretch with Strap - 1 x daily - 7 x weekly - 2 reps - 20 seconds hold Sidelying Hip Abduction - 1 x daily - 7 x weekly - 2 sets - 10 reps Clamshell - 1 x daily - 7 x weekly - 2 sets - 10 reps Supine Figure 4 Piriformis Stretch - 2 x daily - 7 x weekly - 1 sets - 2 reps - 30 seconds hold

## 2020-06-13 NOTE — Therapy (Signed)
[place], [place], [address] Phone: [phone]   Fax:  [phone]  Physical Therapy Treatment  Patient Details  Name: Amanda Schmitt MRN: [id number] Date of Birth: 1945-05-11 Referring Provider (PT): Dr. Dianah Field   Encounter Date: 06/13/2020   PT End of Session - 06/13/20 1144    Visit Number 3    Number of Visits 6    Date for PT Re-Evaluation 07/09/20    PT Start Time 1103    PT Stop Time 1144    PT Time Calculation (min) 41 min    Activity Tolerance Patient tolerated treatment well    Behavior During Therapy [facility] for tasks assessed/performed           Past Medical History:  Diagnosis Date  . Depression     Past Surgical History:  Procedure Laterality Date  . ABDOMINAL HYSTERECTOMY    . ABDOMINAL HYSTERECTOMY    . Bowel blockage    . BOWEL RESECTION    . CESAREAN SECTION      There were no vitals filed for this visit.   Subjective Assessment - 06/13/20 1108    Subjective Pt reports 90% improvement in hip pain.  She hasn't done much exercise in last couple days due to sinus issues.  She is sleeping better at night.    Currently in Pain? No/denies    Pain Score 0-No pain              [facility] PT Assessment - 06/13/20 0001      Assessment   Medical Diagnosis trochanteric bursitis bilat    Referring Provider (PT) Dr. Dianah Field    Onset Date/Surgical Date 04/30/20    Next MD Visit 06/27/20             [facility] Adult PT Treatment/Exercise - 06/13/20 0001      Self-Care   Self-Care Other Self-Care Comments    Other Self-Care Comments  Pt instructed in self massage with roller stick to LE; pt returned demo with cues.      Knee/Hip Exercises: Stretches   Passive Hamstring Stretch Right;Left;2 reps;20 seconds   supine with strap   ITB Stretch Right;Left;2 reps;20 seconds   supine with strap   Piriformis Stretch Right;Left;2 reps;20 seconds      Knee/Hip Exercises:  Aerobic   Nustep L4: 4 min, legs only for warm up.      Knee/Hip Exercises: Standing   SLS Rt/Lt SLS with clock reach x 5 reps each leg.no UE support.    Other Standing Knee Exercises deadlift 10# kettlebell x 10      Knee/Hip Exercises: Supine   Bridges 1 set;10 reps   5 sec hold in ext   Single Leg Bridge Strengthening;Right;Left;1 set;10 reps   fig 4 and straight leg.     Knee/Hip Exercises: Sidelying   Hip ABduction Strengthening;Right;Left;2 sets;10 reps   2nd set with pulses.   Clams 1 set x 10 each leg; cues to engage core.                  PT Education - 06/13/20 1136    Education Details HEP updated; includes stretches.    Person(s) Educated Patient    Methods Explanation;Demonstration;Handout;Verbal cues    Comprehension Verbalized understanding;Returned demonstration               PT Long Term Goals - 06/13/20 1148      PT LONG TERM GOAL #1   Title  Pt will be independent with HEP    Time 6    Period Weeks    Status On-going      PT LONG TERM GOAL #2   Title Pt will sleep x 6 hours with pain <= 2/10    Time 6    Period Weeks    Status Achieved      PT LONG TERM GOAL #3   Title Pt will increase bilat hip strength to 4+/5 in all directions to perform walking > 2 miles without pain    Time 6    Period Weeks    Status On-going      PT LONG TERM GOAL #4   Title Pt will improve FOTO functional status measure to >91 to show improved functional abilities    Time 6    Period Weeks    Status On-going                 Plan - 06/13/20 1147    Clinical Impression Statement All exercises tolerated well, without increase in pain.  Some cramping in Lt hamstring with single leg bridge; eased with HS stretch.  Updated HEP. Pt has met LTG#2.    PT Frequency 1x / week    PT Duration 6 weeks    PT Next Visit Plan progress HEP; assess hip strength.    PT Home Exercise Plan Access Code: [email or web address]    [email or web address] and Agree with Plan of Care Patient            Patient will benefit from skilled therapeutic intervention in order to improve the following deficits and impairments:     Visit Diagnosis: Pain in right hip  Pain in left hip  Muscle weakness (generalized)     Problem List Patient Active Problem List   Diagnosis Date Noted  . Trochanteric bursitis of both hips 04/15/2020  . Colon cancer screening 02/05/2020  . GAD (generalized anxiety disorder) 10/16/2019  . Trigeminal neuralgia 09/27/2019  . Palpitations 09/27/2019  . Epigastric pain 09/27/2019  . Primary insomnia 06/22/2016  . Osteopenia of multiple sites 08/11/2015  . Mixed hyperlipidemia 08/11/2015  . Major depressive disorder in full remission ([place]) 08/11/2015  . Senile nuclear sclerosis 07/25/2014   Kerin Perna, PTA 06/13/20 11:51 AM  [place], [place], [address] Phone: [phone]   Fax:  [phone]  Name: Amanda Schmitt MRN: [id number] Date of Birth: 17-Jul-1945

## 2020-06-19 ENCOUNTER — Ambulatory Visit (INDEPENDENT_AMBULATORY_CARE_PROVIDER_SITE_OTHER): Payer: Medicare PPO | Admitting: Physical Therapy

## 2020-06-19 ENCOUNTER — Other Ambulatory Visit: Payer: Self-pay

## 2020-06-19 ENCOUNTER — Encounter: Payer: Self-pay | Admitting: Physical Therapy

## 2020-06-19 DIAGNOSIS — M25552 Pain in left hip: Secondary | ICD-10-CM

## 2020-06-19 DIAGNOSIS — M6281 Muscle weakness (generalized): Secondary | ICD-10-CM

## 2020-06-19 DIAGNOSIS — M25551 Pain in right hip: Secondary | ICD-10-CM

## 2020-06-19 NOTE — Patient Instructions (Signed)
Access Code: [email or web address] URL: [email or web address] Date: 06/19/2020 Prepared by: [facility]  Exercises Kettlebell Deadlift - 1 x daily - 7 x weekly - 1-2 sets - 10 reps Single Leg Balance with Clock Reach - 1 x daily - 7 x weekly - 2 sets - 10 reps Supine Bridge - 1 x daily - 7 x weekly - 1 sets - 10 reps - 5 hold Figure 4 Bridge - 1 x daily - 7 x weekly - 1 sets - 10 reps Supine Hamstring Stretch with Strap - 1 x daily - 7 x weekly - 2 reps - 20 seconds hold Supine ITB Stretch with Strap - 1 x daily - 7 x weekly - 2 reps - 20 seconds hold Sidelying Hip Abduction - 1 x daily - 7 x weekly - 2 sets - 10 reps Clamshell - 1 x daily - 7 x weekly - 2 sets - 10 reps Supine Figure 4 Piriformis Stretch - 2 x daily - 7 x weekly - 1 sets - 2 reps - 30 seconds hold Upright Side Lunge - 1 x daily - 1 x weekly - 1-2 sets - 10 reps Lateral Step Ups - 1 x daily - 7 x weekly - 1-2 sets - 10 reps

## 2020-06-19 NOTE — Therapy (Addendum)
[place], [place], [address] Phone: [phone]   Fax:  [phone]  Physical Therapy Treatment and Discharge  Patient Details  Name: Amanda Schmitt MRN: [id number] Date of Birth: 1946/03/29 Referring Provider (PT): Dr. Dianah Field   Encounter Date: 06/19/2020   PT End of Session - 06/19/20 1129    Visit Number 4    Number of Visits 6    Date for PT Re-Evaluation 07/09/20    PT Start Time 1100    PT Stop Time 1143    PT Time Calculation (min) 43 min    Activity Tolerance Patient tolerated treatment well    Behavior During Therapy [facility] for tasks assessed/performed           Past Medical History:  Diagnosis Date  . Depression     Past Surgical History:  Procedure Laterality Date  . ABDOMINAL HYSTERECTOMY    . ABDOMINAL HYSTERECTOMY    . Bowel blockage    . BOWEL RESECTION    . CESAREAN SECTION      There were no vitals filed for this visit.   Subjective Assessment - 06/19/20 1152    Subjective Pt reports she has been painfree for 1 wk and not taking medicine.  She is pleased wiht progress.    Patient Stated Goals lay on my side without pain, sleep without pain    Currently in Pain? No/denies              [facility] PT Assessment - 06/19/20 0001      Assessment   Medical Diagnosis trochanteric bursitis bilat    Referring Provider (PT) Dr. Dianah Field    Onset Date/Surgical Date 04/30/20    Next MD Visit 06/27/20      Strength   Right Hip Flexion 5/5    Right Hip Extension 4+/5    Right Hip ABduction 5/5    Left Hip Flexion 5/5    Left Hip Extension 4+/5    Left Hip ABduction 5/5            [facility] Adult PT Treatment/Exercise - 06/19/20 0001      Knee/Hip Exercises: Stretches   Passive Hamstring Stretch Right;Left;2 reps;20 seconds   supine with strap   ITB Stretch Right;Left;2 reps;20 seconds   supine with strap   Piriformis Stretch Right;Left;2 reps;20 seconds       Knee/Hip Exercises: Aerobic   Recumbent Bike L1-3: 5 min for warm up.      Knee/Hip Exercises: Standing   Lateral Step Up Right;Left;1 set;10 reps   cross over   Other Standing Knee Exercises side lunge and curtsey x 5 reps each side - stopped curtsy due to form.    Other Standing Knee Exercises deadlift 10# kettlebell x 10      Knee/Hip Exercises: Supine   Single Leg Bridge Strengthening;Right;Left;1 set;10 reps   fig 4     Knee/Hip Exercises: Sidelying   Hip ABduction Strengthening;Right;Left;1 set;15 reps   knee touch to hip ext/abdct   Clams 1 set x 10 each leg; cues to engage core; reverse clams x 10              PT Long Term Goals - 06/19/20 1129      PT LONG TERM GOAL #1   Title Pt will be independent with HEP    Time 6    Period Weeks    Status Achieved      PT LONG TERM GOAL #2  Title Pt will sleep x 6 hours with pain <= 2/10    Time 6    Period Weeks    Status Achieved      PT LONG TERM GOAL #3   Title Pt will increase bilat hip strength to 4+/5 in all directions to perform walking > 2 miles without pain    Time 6    Period Weeks    Status Achieved      PT LONG TERM GOAL #4   Title Pt will improve FOTO functional status measure to >91 to show improved functional abilities    Baseline 94%    Time 6    Period Weeks    Status Achieved                 Plan - 06/19/20 1151    Clinical Impression Statement Pt had difficulty with form of cross over lunges; changed to cross over step ups on stairs without difficulty or pain (added to HEP).  Pt tolerated all other exercises well.  She has met all goals and verbalized readiness to d/c to HEP.    PT Frequency 1x / week    PT Duration 6 weeks    PT Treatment/Interventions Cryotherapy;Electrical Stimulation;Neuromuscular re-education;Therapeutic exercise;Therapeutic activities;Moist Heat;Iontophoresis 45m/ml Dexamethasone;Patient/family education;Passive range of motion;Dry needling;Taping;Manual techniques     PT Next Visit Plan spoke to supervising PT; will d/c at this time.    PT Home Exercise Plan Access Code: [email or web address]    [email or web address] Agree with Plan of Care Patient           Patient will benefit from skilled therapeutic intervention in order to improve the following deficits and impairments:  Decreased strength,Pain,Decreased activity tolerance  Visit Diagnosis: Pain in right hip  Pain in left hip  Muscle weakness (generalized)     Problem List Patient Active Problem List   Diagnosis Date Noted  . Trochanteric bursitis of both hips 04/15/2020  . Colon cancer screening 02/05/2020  . GAD (generalized anxiety disorder) 10/16/2019  . Trigeminal neuralgia 09/27/2019  . Palpitations 09/27/2019  . Epigastric pain 09/27/2019  . Primary insomnia 06/22/2016  . Osteopenia of multiple sites 08/11/2015  . Mixed hyperlipidemia 08/11/2015  . [place] 08/11/2015  . Senile nuclear sclerosis 07/25/2014    PHYSICAL THERAPY DISCHARGE SUMMARY  Visits from Start of Care: 4  Current functional level related to goals / functional outcomes: Decreased pain and improved strength   Remaining deficits: See above   Education / Equipment: HEP  Plan: Patient agrees to discharge.  Patient goals were met. Patient is being discharged due to meeting the stated rehab goals.  ?????    KIsabelle Course PT,DPT02/16/229:08 AM   JKerin Perna PTA 06/19/20 12:02 PM  [place] [address]Phone: [phone]  Fax:  [phone] Name: MCAMI DELAWDERMRN: [id number] of Birth: 2May 04, 1947

## 2020-06-27 ENCOUNTER — Ambulatory Visit: Payer: Medicare PPO | Admitting: Sports Medicine

## 2020-06-30 ENCOUNTER — Other Ambulatory Visit: Payer: Self-pay

## 2020-06-30 ENCOUNTER — Ambulatory Visit (INDEPENDENT_AMBULATORY_CARE_PROVIDER_SITE_OTHER): Payer: Medicare PPO

## 2020-06-30 ENCOUNTER — Ambulatory Visit: Payer: Medicare PPO | Admitting: Sports Medicine

## 2020-06-30 DIAGNOSIS — M7062 Trochanteric bursitis, left hip: Secondary | ICD-10-CM

## 2020-06-30 DIAGNOSIS — N281 Cyst of kidney, acquired: Secondary | ICD-10-CM | POA: Diagnosis not present

## 2020-06-30 DIAGNOSIS — M48061 Spinal stenosis, lumbar region without neurogenic claudication: Secondary | ICD-10-CM | POA: Diagnosis not present

## 2020-06-30 DIAGNOSIS — M5416 Radiculopathy, lumbar region: Secondary | ICD-10-CM

## 2020-06-30 DIAGNOSIS — M47816 Spondylosis without myelopathy or radiculopathy, lumbar region: Secondary | ICD-10-CM | POA: Insufficient documentation

## 2020-06-30 DIAGNOSIS — M4807 Spinal stenosis, lumbosacral region: Secondary | ICD-10-CM | POA: Diagnosis not present

## 2020-06-30 DIAGNOSIS — M7061 Trochanteric bursitis, right hip: Secondary | ICD-10-CM

## 2020-06-30 DIAGNOSIS — M5116 Intervertebral disc disorders with radiculopathy, lumbar region: Secondary | ICD-10-CM | POA: Diagnosis not present

## 2020-06-30 NOTE — Assessment & Plan Note (Signed)
Amanda Schmitt did really well with her trochanteric bursa injections, she has no pain on the lateral hips anymore. She is doing a lot better with Lyrica as well.

## 2020-06-30 NOTE — Assessment & Plan Note (Signed)
Amanda Schmitt is a pleasant 75 year old female, she has done well with conservative treatment so far, unfortunately she continues to have pain in her low back with radiation down the right leg in an L5 distribution. No red flag symptoms. She has done greater than 6 weeks of conservative treatment, she is also had x-rays, so we are going to proceed with an MRI today, we will likely set her up with a right L5-S1 transforaminal epidural if she is not feeling significantly better by April, she still has her trip coming up to [place] at the end of May.

## 2020-06-30 NOTE — Progress Notes (Signed)
    Procedures performed today:    None.  Independent interpretation of notes and tests performed by another provider:   None.  Brief History, Exam, Impression, and Recommendations:    Right lumbar radiculitis Amanda Schmitt is a pleasant 75 year old female, she has done well with conservative treatment so far, unfortunately she continues to have pain in her low back with radiation down the right leg in an L5 distribution. No red flag symptoms. She has done greater than 6 weeks of conservative treatment, she is also had x-rays, so we are going to proceed with an MRI today, we will likely set her up with a right L5-S1 transforaminal epidural if she is not feeling significantly better by April, she still has her trip coming up to [place] at the end of May.  Trochanteric bursitis of both hips Amanda Schmitt did really well with her trochanteric bursa injections, she has no pain on the lateral hips anymore. She is doing a lot better with Lyrica as well.    ___________________________________________ Ihor Austin. [name], M.D., ABFM., CAQSM. Primary Care and Sports Medicine [facility]  Adjunct Instructor of Family Medicine  [employer]

## 2020-08-12 ENCOUNTER — Other Ambulatory Visit: Payer: Self-pay | Admitting: Family Medicine

## 2020-08-14 DIAGNOSIS — L820 Inflamed seborrheic keratosis: Secondary | ICD-10-CM | POA: Diagnosis not present

## 2020-08-14 DIAGNOSIS — L570 Actinic keratosis: Secondary | ICD-10-CM | POA: Diagnosis not present

## 2020-08-14 DIAGNOSIS — B009 Herpesviral infection, unspecified: Secondary | ICD-10-CM | POA: Diagnosis not present

## 2020-08-15 DIAGNOSIS — Z1231 Encounter for screening mammogram for malignant neoplasm of breast: Secondary | ICD-10-CM | POA: Diagnosis not present

## 2020-08-15 LAB — HM MAMMOGRAPHY

## 2020-08-20 ENCOUNTER — Encounter: Payer: Self-pay | Admitting: Family Medicine

## 2020-08-20 DIAGNOSIS — K5730 Diverticulosis of large intestine without perforation or abscess without bleeding: Secondary | ICD-10-CM | POA: Diagnosis not present

## 2020-08-20 DIAGNOSIS — Z1211 Encounter for screening for malignant neoplasm of colon: Secondary | ICD-10-CM | POA: Diagnosis not present

## 2020-08-20 LAB — HM COLONOSCOPY

## 2020-08-27 ENCOUNTER — Encounter: Payer: Self-pay | Admitting: Family Medicine

## 2020-08-29 ENCOUNTER — Encounter: Payer: Self-pay | Admitting: Family Medicine

## 2020-09-10 ENCOUNTER — Other Ambulatory Visit: Payer: Self-pay | Admitting: Family Medicine

## 2020-09-23 ENCOUNTER — Ambulatory Visit: Payer: Medicare PPO | Admitting: Family Medicine

## 2020-09-23 ENCOUNTER — Encounter: Payer: Self-pay | Admitting: Family Medicine

## 2020-09-23 ENCOUNTER — Other Ambulatory Visit: Payer: Self-pay

## 2020-09-23 DIAGNOSIS — N390 Urinary tract infection, site not specified: Secondary | ICD-10-CM | POA: Insufficient documentation

## 2020-09-23 DIAGNOSIS — F411 Generalized anxiety disorder: Secondary | ICD-10-CM | POA: Diagnosis not present

## 2020-09-23 DIAGNOSIS — F5101 Primary insomnia: Secondary | ICD-10-CM

## 2020-09-23 MED ORDER — ZOLPIDEM TARTRATE 5 MG PO TABS
5.0000 mg | ORAL_TABLET | Freq: Every evening | ORAL | 1 refills | Status: DC | PRN
Start: 2020-09-23 — End: 2020-11-25

## 2020-09-23 MED ORDER — CLONAZEPAM 0.5 MG PO TABS: 0.5000 mg | ORAL_TABLET | Freq: Two times a day (BID) | ORAL | 1 refills | Status: DC | PRN

## 2020-09-23 MED ORDER — CEPHALEXIN 500 MG PO CAPS: 500.0000 mg | ORAL_CAPSULE | Freq: Two times a day (BID) | ORAL | 0 refills | Status: DC

## 2020-09-23 NOTE — Progress Notes (Signed)
Amanda Schmitt - 75 y.o. female MRN [id number]  Date of birth: 22-Oct-1945  Subjective Chief Complaint  Patient presents with  . Medication Refill    HPI Amanda Schmitt is a 75 y.o. female here today for follow up for insomnia.  She also reports that she will be traveling to [place] this week.  She would like antibiotic to take with her as she often gets UTI when she is abroad due to increased walking and poor hydration.   She is currently taking ambien cr 6.25mg  nightly for sleep.  This works pretty well but several mornings she has woken up and felt groggy. She would like to change back to 5mg  IR.   She also requests renewal of clonazepam for anxiety and upcoming flight.  She uses this sparingly.   ROS:  A comprehensive ROS was completed and negative except as noted per HPI  Allergies  Allergen Reactions  . Moxifloxacin Other (See Comments)    Flu like symptoms  . Codeine Nausea Only  . Nitrofuran Derivatives   . Tramadol Nausea Only    Past Medical History:  Diagnosis Date  . Depression     Past Surgical History:  Procedure Laterality Date  . ABDOMINAL HYSTERECTOMY    . ABDOMINAL HYSTERECTOMY    . Bowel blockage    . BOWEL RESECTION    . CESAREAN SECTION      Social History   Socioeconomic History  . Marital status: Widowed    Spouse name: Not on file  . Number of children: 2  . Years of education: 16  . Highest education level: Bachelor's degree (e.g., BA, AB, BS)  Occupational History  . Occupation: Retired    Comment:  Tobacco Use  . Smoking status: Former Smoker    Packs/day: 0.50    Years: 26.00    Pack years: 13.00    Quit date: 1999    Years since quitting: 23.3  . Smokeless tobacco: Never Used  Substance and Sexual Activity  . Alcohol use: Yes    Alcohol/week: 2.0 standard drinks    Types: 2 Glasses of wine per week    Comment: per night  . Drug use: No  . Sexual activity: Not Currently  Other Topics Concern  . Not on file  Social  History Narrative   Volunteers at the hospital two days out of the week. Enjoys doing crossword puzzles, reading and painting furniture.   Social Determinants of Health   Financial Resource Strain: Low Risk   . Difficulty of Paying Living Expenses: Not hard at all  Food Insecurity: No Food Insecurity  . Worried About 2000 in the Last Year: Never true  . Ran Out of Food in the Last Year: Never true  Transportation Needs: No Transportation Needs  . Lack of Transportation (Medical): No  . Lack of Transportation (Non-Medical): No  Physical Activity: Insufficiently Active  . Days of Exercise per Week: 3 days  . Minutes of Exercise per Session: 30 min  Stress: No Stress Concern Present  . Feeling of Stress : Not at all  Social Connections: Socially Isolated  . Frequency of Communication with Friends and Family: More than three times a week  . Frequency of Social Gatherings with Friends and Family: Once a week  . Attends Religious Services: Never  . Active Member of Clubs or Organizations: No  . Attends Programme researcher, broadcasting/film/video Meetings: Never  . Marital Status: Widowed    Family History  Problem  Relation Age of Onset  . Cancer Father        unorgin    Health Maintenance  Topic Date Due  . Hepatitis C Screening  05/23/2021 (Originally 06/24/1963)  . INFLUENZA VACCINE  12/08/2020  . MAMMOGRAM  08/15/2021  . TETANUS/TDAP  08/24/2026  . COLONOSCOPY (Pts 45-27yrs Insurance coverage will need to be confirmed)  08/21/2030  . DEXA SCAN  Completed  . COVID-19 Vaccine  Completed  . PNA vac Low Risk Adult  Completed  . HPV VACCINES  Aged Out     ----------------------------------------------------------------------------------------------------------------------------------------------------------------------------------------------------------------- Physical Exam BP (!) 142/62 (BP Location: Left Arm, Patient Position: Sitting, Cuff Size: Normal)   Pulse 61   Temp (!)  97.4 F (36.3 C)   Ht 4' 11.45" (1.51 m)   Wt 127 lb 8 oz (57.8 kg)   SpO2 100%   BMI 25.36 kg/m   Physical Exam Constitutional:      Appearance: Normal appearance.  HENT:     Head: Normocephalic and atraumatic.  Cardiovascular:     Rate and Rhythm: Normal rate and regular rhythm.  Pulmonary:     Effort: Pulmonary effort is normal.     Breath sounds: Normal breath sounds.  Neurological:     General: No focal deficit present.     Mental Status: She is alert.  Psychiatric:        Mood and Affect: Mood normal.        Behavior: Behavior normal.     ------------------------------------------------------------------------------------------------------------------------------------------------------------------------------------------------------------------- Assessment and Plan  Recurrent UTI Often gets UTI when traveling.  Cephalexin sent in to start if she develops symptoms during her upcoming trip.  If not improving with this she should arrange for visit to be evaluated.   GAD (generalized anxiety disorder) Renewal of clonazepam to use sparingly as needed.  Mainly uses for flying.   Primary insomnia Change zolpidem cr to zolpidem at 5mg  nightly.  She will let me know how she is doing with this after a few weeks.    Meds ordered this encounter  Medications  . cephALEXin (KEFLEX) 500 MG capsule    Sig: Take 1 capsule (500 mg total) by mouth 2 (two) times daily.    Dispense:  14 capsule    Refill:  0  . clonazePAM (KLONOPIN) 0.5 MG tablet    Sig: Take 1 tablet (0.5 mg total) by mouth 2 (two) times daily as needed for anxiety.    Dispense:  20 tablet    Refill:  1  . zolpidem (AMBIEN) 5 MG tablet    Sig: Take 1 tablet (5 mg total) by mouth at bedtime as needed for sleep.    Dispense:  30 tablet    Refill:  1    No follow-ups on file.    This visit occurred during the SARS-CoV-2 public health emergency.  Safety protocols were in place, including screening questions  prior to the visit, additional usage of staff PPE, and extensive cleaning of exam room while observing appropriate contact time as indicated for disinfecting solutions.

## 2020-09-23 NOTE — Assessment & Plan Note (Signed)
Change zolpidem cr to zolpidem at 5mg  nightly.  She will let me know how she is doing with this after a few weeks.

## 2020-09-23 NOTE — Assessment & Plan Note (Signed)
Renewal of clonazepam to use sparingly as needed.  Mainly uses for flying.

## 2020-09-23 NOTE — Assessment & Plan Note (Signed)
Often gets UTI when traveling.  Cephalexin sent in to start if she develops symptoms during her upcoming trip.  If not improving with this she should arrange for visit to be evaluated.

## 2020-10-27 ENCOUNTER — Encounter: Payer: Self-pay | Admitting: Family Medicine

## 2020-11-23 ENCOUNTER — Other Ambulatory Visit: Payer: Self-pay | Admitting: Family Medicine

## 2020-11-23 ENCOUNTER — Encounter: Payer: Self-pay | Admitting: Family Medicine

## 2020-11-25 ENCOUNTER — Other Ambulatory Visit: Payer: Self-pay | Admitting: Family Medicine

## 2020-11-25 MED ORDER — ZOLPIDEM TARTRATE 5 MG PO TABS: 5.0000 mg | ORAL_TABLET | Freq: Every evening | ORAL | 3 refills | Status: DC | PRN

## 2021-01-08 ENCOUNTER — Telehealth: Payer: Self-pay | Admitting: Lab

## 2021-01-08 NOTE — Progress Notes (Signed)
  Chronic Care Management   Outreach Note  01/08/2021 Name: Amanda Schmitt MRN: [id number] DOB: 1946/04/30  Referred by: Everrett Coombe, DO Reason for referral : Medication Management   An unsuccessful telephone outreach was attempted today. The patient was referred to the pharmacist for assistance with care management and care coordination.   Follow Up Plan:   [name]  Upstream Scheduler

## 2021-01-26 ENCOUNTER — Telehealth: Payer: Self-pay | Admitting: Lab

## 2021-01-26 NOTE — Progress Notes (Signed)
  Chronic Care Management   Outreach Note  01/26/2021 Name: Amanda Schmitt MRN: [id number] DOB: [birth year]  Referred by: Everrett Coombe, DO Reason for referral : Medication Management   A second unsuccessful telephone outreach was attempted today. The patient was referred to pharmacist for assistance with care management and care coordination.  Follow Up Plan:   SIGNATURE [name]  Upstream Scheduler

## 2021-02-17 ENCOUNTER — Ambulatory Visit: Payer: Medicare PPO | Admitting: Family Medicine

## 2021-02-23 ENCOUNTER — Encounter: Payer: Self-pay | Admitting: Family Medicine

## 2021-02-23 ENCOUNTER — Ambulatory Visit: Payer: Medicare PPO | Admitting: Family Medicine

## 2021-02-23 VITALS — BP 143/67 | HR 63 | Temp 98.5°F | Ht 59.0 in | Wt 131.3 lb

## 2021-02-23 DIAGNOSIS — F411 Generalized anxiety disorder: Secondary | ICD-10-CM

## 2021-02-23 DIAGNOSIS — F3342 Major depressive disorder, recurrent, in full remission: Secondary | ICD-10-CM

## 2021-02-23 MED ORDER — CLONAZEPAM 0.5 MG PO TABS: 0.5000 mg | ORAL_TABLET | Freq: Two times a day (BID) | ORAL | 1 refills | Status: DC | PRN

## 2021-02-23 MED ORDER — ZOLPIDEM TARTRATE 5 MG PO TABS: 5.0000 mg | ORAL_TABLET | Freq: Every evening | ORAL | 3 refills | Status: DC | PRN

## 2021-02-23 NOTE — Patient Instructions (Signed)
Great to see you today! You can try increase in lexapro to 15 mg if needed.  I have placed referral for a therapist.

## 2021-02-23 NOTE — Progress Notes (Signed)
Amanda Schmitt - 75 y.o. female MRN [id number]  Date of birth: 02-Jun-1945  Subjective No chief complaint on file.   HPI Amanda Schmitt is a 75 year old female here today for follow-up of depression and anxiety.  Current treatment with Lexapro 10 mg daily.  She also has clonazepam as needed during the day and Ambien as needed at night.  She does like medications are working well however mentions that some family members have noted that she feels a little more depressed and anxious at times.  She mentions seeing a therapist to help with her depression and anxiety.  She feels like the medicine is in a good dose.  ROS:  A comprehensive ROS was completed and negative except as noted per HPI  Allergies  Allergen Reactions   Moxifloxacin Other (See Comments)    Flu like symptoms   Codeine Nausea Only   Nitrofuran Derivatives    Tramadol Nausea Only    Past Medical History:  Diagnosis Date   Depression     Past Surgical History:  Procedure Laterality Date   ABDOMINAL HYSTERECTOMY     ABDOMINAL HYSTERECTOMY     Bowel blockage     BOWEL RESECTION     CESAREAN SECTION      Social History   Socioeconomic History   Marital status: Widowed    Spouse name: Not on file   Number of children: 2   Years of education: 16   Highest education level: Bachelor's degree (e.g., BA, AB, BS)  Occupational History   Occupation: Retired    Comment: Runner, broadcasting/film/video  Tobacco Use   Smoking status: Former    Packs/day: 0.50    Years: 26.00    Pack years: 13.00    Types: Cigarettes    Quit date: 1999    Years since quitting: 23.8   Smokeless tobacco: Never  Substance and Sexual Activity   Alcohol use: Yes    Alcohol/week: 2.0 standard drinks    Types: 2 Glasses of wine per week    Comment: per night   Drug use: No   Sexual activity: Not Currently  Other Topics Concern   Not on file  Social History Narrative   Volunteers at the hospital two days out of the week. Enjoys doing crossword puzzles, reading and  painting furniture.   Social Determinants of Health   Financial Resource Strain: Low Risk    Difficulty of Paying Living Expenses: Not hard at all  Food Insecurity: No Food Insecurity   Worried About Programme researcher, broadcasting/film/video in the Last Year: Never true   Ran Out of Food in the Last Year: Never true  Transportation Needs: No Transportation Needs   Lack of Transportation (Medical): No   Lack of Transportation (Non-Medical): No  Physical Activity: Insufficiently Active   Days of Exercise per Week: 3 days   Minutes of Exercise per Session: 30 min  Stress: No Stress Concern Present   Feeling of Stress : Not at all  Social Connections: Socially Isolated   Frequency of Communication with Friends and Family: More than three times a week   Frequency of Social Gatherings with Friends and Family: Once a week   Attends Religious Services: Never   Database administrator or Organizations: No   Attends Banker Meetings: Never   Marital Status: Widowed    Family History  Problem Relation Age of Onset   Cancer Father        unorgin    Health Maintenance  Topic  Date Due   INFLUENZA VACCINE  12/08/2020   Hepatitis C Screening  05/23/2021 (Originally 06/24/1963)   MAMMOGRAM  08/15/2021   TETANUS/TDAP  08/24/2026   COLONOSCOPY (Pts 45-23yrs Insurance coverage will need to be confirmed)  08/21/2030   DEXA SCAN  Completed   COVID-19 Vaccine  Completed   Zoster Vaccines- Shingrix  Completed   HPV VACCINES  Aged Out     ----------------------------------------------------------------------------------------------------------------------------------------------------------------------------------------------------------------- Physical Exam BP (!) 143/67 (BP Location: Left Arm, Patient Position: Sitting, Cuff Size: Small)   Pulse 63   Temp 98.5 F (36.9 C)   Ht 4\' 11"  (1.499 m)   Wt 131 lb 4.8 oz (59.6 kg)   SpO2 97%   BMI 26.52 kg/m   Physical Exam Constitutional:       Appearance: Normal appearance.  Eyes:     General: No scleral icterus. Cardiovascular:     Rate and Rhythm: Normal rate and regular rhythm.  Pulmonary:     Effort: Pulmonary effort is normal.     Breath sounds: Normal breath sounds.  Musculoskeletal:     Cervical back: Neck supple.  Neurological:     General: No focal deficit present.     Mental Status: She is alert.  Psychiatric:        Mood and Affect: Mood normal.        Behavior: Behavior normal.    ------------------------------------------------------------------------------------------------------------------------------------------------------------------------------------------------------------------- Assessment and Plan  GAD (generalized anxiety disorder) Would not make any adjustments to medications at this time.  Clonazepam and Ambien refilled.  Referral placed for counseling/therapy.   Meds ordered this encounter  Medications   clonazePAM (KLONOPIN) 0.5 MG tablet    Sig: Take 1 tablet (0.5 mg total) by mouth 2 (two) times daily as needed for anxiety.    Dispense:  20 tablet    Refill:  1   zolpidem (AMBIEN) 5 MG tablet    Sig: Take 1 tablet (5 mg total) by mouth at bedtime as needed for sleep.    Dispense:  30 tablet    Refill:  3    No follow-ups on file.    This visit occurred during the SARS-CoV-2 public health emergency.  Safety protocols were in place, including screening questions prior to the visit, additional usage of staff PPE, and extensive cleaning of exam room while observing appropriate contact time as indicated for disinfecting solutions.

## 2021-02-23 NOTE — Assessment & Plan Note (Signed)
Would not make any adjustments to medications at this time.  Clonazepam and Ambien refilled.  Referral placed for counseling/therapy.

## 2021-03-04 ENCOUNTER — Ambulatory Visit (INDEPENDENT_AMBULATORY_CARE_PROVIDER_SITE_OTHER): Payer: Medicare PPO

## 2021-03-04 ENCOUNTER — Ambulatory Visit: Payer: Medicare PPO | Admitting: Sports Medicine

## 2021-03-04 DIAGNOSIS — M7061 Trochanteric bursitis, right hip: Secondary | ICD-10-CM

## 2021-03-04 DIAGNOSIS — M7062 Trochanteric bursitis, left hip: Secondary | ICD-10-CM | POA: Diagnosis not present

## 2021-03-04 NOTE — Progress Notes (Signed)
    Procedures performed today:    Procedure: Real-time Ultrasound Guided injection of the right greater trochanteric bursa Device: Samsung HS60  Verbal informed consent obtained.  Time-out conducted.  Noted no overlying erythema, induration, or other signs of local infection.  Skin prepped in a sterile fashion.  Local anesthesia: Topical Ethyl chloride.  With sterile technique and under real time ultrasound guidance: Noted normal-appearing greater trochanter, 1 cc Kenalog 40, 2 cc lidocaine, 2 cc bupivacaine injected easily Completed without difficulty  Advised to call if fevers/chills, erythema, induration, drainage, or persistent bleeding.  Images permanently stored and available for review in PACS.  Impression: Technically successful ultrasound guided injection.  Independent interpretation of notes and tests performed by another provider:   None.  Brief History, Exam, Impression, and Recommendations:    Trochanteric bursitis of both hips This is a very pleasant 75 year old female, known bilateral trochanteric bursitis, we injected these back in January of this year, she did really really well, she also was taking Lyrica which was very helpful. Of note she had a essentially normal lumbar spine MRI. She came off of her Lyrica, and started to have recurrence of pain, she restarted the Lyrica and symptoms have improved to some degree, she only has discomfort now on the right side, today it is localized directly over the greater trochanter, hip motion is excellent, no pain with internal rotation, we repeated her right greater trochanteric bursa injection today, return to see me as needed.  Chronic process with exacerbation and pharmacologic intervention  ___________________________________________ Ihor Austin. [name], M.D., ABFM., CAQSM. Primary Care and Sports Medicine [facility]  Adjunct Instructor of Family Medicine  [employer]

## 2021-03-04 NOTE — Assessment & Plan Note (Signed)
This is a very pleasant 75 year old female, known bilateral trochanteric bursitis, we injected these back in January of this year, she did really really well, she also was taking Lyrica which was very helpful. Of note she had a essentially normal lumbar spine MRI. She came off of her Lyrica, and started to have recurrence of pain, she restarted the Lyrica and symptoms have improved to some degree, she only has discomfort now on the right side, today it is localized directly over the greater trochanter, hip motion is excellent, no pain with internal rotation, we repeated her right greater trochanteric bursa injection today, return to see me as needed.

## 2021-03-23 ENCOUNTER — Ambulatory Visit (INDEPENDENT_AMBULATORY_CARE_PROVIDER_SITE_OTHER): Payer: Medicare PPO | Admitting: Psychologist

## 2021-03-23 ENCOUNTER — Other Ambulatory Visit: Payer: Self-pay

## 2021-03-23 DIAGNOSIS — F320 Major depressive disorder, single episode, mild: Secondary | ICD-10-CM

## 2021-04-08 ENCOUNTER — Ambulatory Visit: Payer: Medicare PPO | Admitting: Sports Medicine

## 2021-04-14 ENCOUNTER — Ambulatory Visit: Payer: Medicare PPO | Admitting: Psychologist

## 2021-04-20 ENCOUNTER — Other Ambulatory Visit: Payer: Self-pay

## 2021-04-20 ENCOUNTER — Ambulatory Visit (INDEPENDENT_AMBULATORY_CARE_PROVIDER_SITE_OTHER): Payer: Medicare PPO

## 2021-04-20 ENCOUNTER — Ambulatory Visit: Payer: Medicare PPO | Admitting: Sports Medicine

## 2021-04-20 DIAGNOSIS — M7062 Trochanteric bursitis, left hip: Secondary | ICD-10-CM | POA: Diagnosis not present

## 2021-04-20 DIAGNOSIS — M7061 Trochanteric bursitis, right hip: Secondary | ICD-10-CM

## 2021-04-20 MED ORDER — NITROGLYCERIN 0.2 MG/HR TD PT24: MEDICATED_PATCH | TRANSDERMAL | 11 refills | Status: DC

## 2021-04-20 MED ORDER — PREGABALIN 75 MG PO CAPS: 75.0000 mg | ORAL_CAPSULE | Freq: Three times a day (TID) | ORAL | 1 refills | Status: DC

## 2021-04-20 NOTE — Assessment & Plan Note (Addendum)
This is a pleasant 75 year old female, known bilateral trochanteric bursitis, the right side was injected about 6 weeks ago, she is having some pain on the left today, injected today, I am also going to restart her Lyrica, 50 mg was insufficient so we will go up to 75 mg 3 times a day with a 2-week dose titration, she will let me know if she wants to go up to 100 mg. In addition we will add topical nitroglycerin. Of note she did have a normal lumbar spine MRI.

## 2021-04-20 NOTE — Progress Notes (Signed)
    Procedures performed today:    Procedure: Real-time Ultrasound Guided injection of the left greater trochanteric bursa Device: Samsung HS60  Verbal informed consent obtained.  Time-out conducted.  Noted no overlying erythema, induration, or other signs of local infection.  Skin prepped in a sterile fashion.  Local anesthesia: Topical Ethyl chloride.  With sterile technique and under real time ultrasound guidance: Noted normal-appearing bursa, 1 cc Kenalog 40, 2 cc lidocaine, 2 cc bupivacaine injected easily Completed without difficulty  Advised to call if fevers/chills, erythema, induration, drainage, or persistent bleeding.  Images permanently stored and available for review in PACS.  Impression: Technically successful ultrasound guided injection.  Independent interpretation of notes and tests performed by another provider:   None.  Brief History, Exam, Impression, and Recommendations:    Trochanteric bursitis of both hips This is a pleasant 75 year old female, known bilateral trochanteric bursitis, the right side was injected about 6 weeks ago, she is having some pain on the left today, injected today, I am also going to restart her Lyrica, 50 mg was insufficient so we will go up to 75 mg 3 times a day with a 2-week dose titration, she will let me know if she wants to go up to 100 mg. In addition we will add topical nitroglycerin. Of note she did have a normal lumbar spine MRI.    ___________________________________________ Ihor Austin. [name], M.D., ABFM., CAQSM. Primary Care and Sports Medicine [facility]  Adjunct Instructor of Family Medicine  [employer]

## 2021-05-09 ENCOUNTER — Other Ambulatory Visit: Payer: Self-pay | Admitting: Family Medicine

## 2021-05-13 ENCOUNTER — Telehealth: Payer: Self-pay

## 2021-05-13 NOTE — Telephone Encounter (Signed)
Pt states she's been sick for 2 weeks with chest congestion. Tested COVID negative.   Continued chest tightness from congestion. Now COVID (+).

## 2021-05-13 NOTE — Telephone Encounter (Signed)
Recommend continued supportive care with increased fluids and OTC medications for symptom management.  .  If symptoms started 2 weeks ago she is out of the window for antiviral therapeutics.  If symptoms are worsening recommend virtual visit with a provider here or E-visit.    Thanks!

## 2021-05-13 NOTE — Telephone Encounter (Signed)
Scheduled virtual visit for 05/14/2021 due to patient stating new symptoms started 2 days prior.

## 2021-05-14 ENCOUNTER — Telehealth: Payer: Medicare PPO | Admitting: Family Medicine

## 2021-05-14 ENCOUNTER — Other Ambulatory Visit: Payer: Self-pay

## 2021-06-14 ENCOUNTER — Other Ambulatory Visit: Payer: Self-pay | Admitting: Family Medicine

## 2021-06-14 ENCOUNTER — Other Ambulatory Visit: Payer: Self-pay | Admitting: Sports Medicine

## 2021-06-14 DIAGNOSIS — M7061 Trochanteric bursitis, right hip: Secondary | ICD-10-CM

## 2021-06-20 ENCOUNTER — Other Ambulatory Visit: Payer: Self-pay | Admitting: Family Medicine

## 2021-06-24 ENCOUNTER — Other Ambulatory Visit: Payer: Self-pay | Admitting: Family Medicine

## 2021-06-24 MED ORDER — ZOLPIDEM TARTRATE 5 MG PO TABS: 5.0000 mg | ORAL_TABLET | Freq: Every evening | ORAL | 3 refills | Status: DC | PRN

## 2021-07-08 ENCOUNTER — Other Ambulatory Visit: Payer: Self-pay

## 2021-07-08 ENCOUNTER — Ambulatory Visit: Payer: Medicare PPO | Admitting: Family Medicine

## 2021-07-08 ENCOUNTER — Encounter: Payer: Self-pay | Admitting: Family Medicine

## 2021-07-08 VITALS — BP 131/77 | HR 60 | Temp 98.1°F | Ht 60.0 in | Wt 128.0 lb

## 2021-07-08 DIAGNOSIS — E782 Mixed hyperlipidemia: Secondary | ICD-10-CM | POA: Diagnosis not present

## 2021-07-08 DIAGNOSIS — E559 Vitamin D deficiency, unspecified: Secondary | ICD-10-CM | POA: Diagnosis not present

## 2021-07-08 DIAGNOSIS — Z0000 Encounter for general adult medical examination without abnormal findings: Secondary | ICD-10-CM

## 2021-07-08 DIAGNOSIS — R479 Unspecified speech disturbances: Secondary | ICD-10-CM | POA: Diagnosis not present

## 2021-07-08 LAB — LIPID PANEL W/REFLEX DIRECT LDL
Cholesterol: 226 mg/dL — ABNORMAL HIGH (ref <200)
HDL: 106 mg/dL (ref >=50)
LDL Cholesterol (Calc): 105 mg/dL (calc) — ABNORMAL HIGH
Non-HDL Cholesterol (Calc): 120 mg/dL (calc) (ref <130)
Total CHOL/HDL Ratio: 2.1 (calc) (ref <5.0)
Triglycerides: 67 mg/dL (ref <150)

## 2021-07-08 LAB — COMPLETE METABOLIC PANEL WITH GFR
AG Ratio: 1.5 (calc) (ref 1.0–2.5)
ALT: 8 U/L (ref 6–29)
AST: 20 U/L (ref 10–35)
Albumin: 4 g/dL (ref 3.6–5.1)
Alkaline phosphatase (APISO): 60 U/L (ref 37–153)
BUN: 9 mg/dL (ref 7–25)
CO2: 29 mmol/L (ref 20–32)
Calcium: 9.3 mg/dL (ref 8.6–10.4)
Chloride: 106 mmol/L (ref 98–110)
Creat: 0.67 mg/dL (ref 0.60–1.00)
Globulin: 2.7 g/dL (calc) (ref 1.9–3.7)
Glucose, Bld: 82 mg/dL (ref 65–99)
Potassium: 4.7 mmol/L (ref 3.5–5.3)
Sodium: 142 mmol/L (ref 135–146)
Total Bilirubin: 0.6 mg/dL (ref 0.2–1.2)
Total Protein: 6.7 g/dL (ref 6.1–8.1)
eGFR: 91 mL/min/{1.73_m2} (ref >=60)

## 2021-07-08 LAB — VITAMIN D 25 HYDROXY (VIT D DEFICIENCY, FRACTURES): Vit D, 25-Hydroxy: 47 ng/mL (ref 30–100)

## 2021-07-08 LAB — CBC WITH DIFFERENTIAL/PLATELET
Absolute Monocytes: 589 cells/uL (ref 200–950)
Basophils Absolute: 62 cells/uL (ref 0–200)
Basophils Relative: 1 %
Eosinophils Absolute: 260 cells/uL (ref 15–500)
Eosinophils Relative: 4.2 %
HCT: 35.3 % (ref 35.0–45.0)
Hemoglobin: 11.8 g/dL (ref 11.7–15.5)
Lymphs Abs: 1569 cells/uL (ref 850–3900)
MCH: 33.8 pg — ABNORMAL HIGH (ref 27.0–33.0)
MCHC: 33.4 g/dL (ref 32.0–36.0)
MCV: 101.1 fL — ABNORMAL HIGH (ref 80.0–100.0)
MPV: 10.6 fL (ref 7.5–12.5)
Monocytes Relative: 9.5 %
Neutro Abs: 3720 cells/uL (ref 1500–7800)
Neutrophils Relative %: 60 %
Platelets: 283 10*3/uL (ref 140–400)
RBC: 3.49 10*6/uL — ABNORMAL LOW (ref 3.80–5.10)
RDW: 11.4 % (ref 11.0–15.0)
Total Lymphocyte: 25.3 %
WBC: 6.2 10*3/uL (ref 3.8–10.8)

## 2021-07-08 MED ORDER — ZOLPIDEM TARTRATE ER 6.25 MG PO TBCR: 6.2500 mg | EXTENDED_RELEASE_TABLET | Freq: Every evening | ORAL | 3 refills | Status: DC | PRN

## 2021-07-08 NOTE — Patient Instructions (Signed)
Preventive Care 34 Years and Older, Female ?Preventive care refers to lifestyle choices and visits with your health care provider that can promote health and wellness. Preventive care visits are also called wellness exams. ?What can I expect for my preventive care visit? ?Counseling ?Your health care provider may ask you questions about your: ?Medical history, including: ?Past medical problems. ?Family medical history. ?Pregnancy and menstrual history. ?History of falls. ?Current health, including: ?Memory and ability to understand (cognition). ?Emotional well-being. ?Home life and relationship well-being. ?Sexual activity and sexual health. ?Lifestyle, including: ?Alcohol, nicotine or tobacco, and drug use. ?Access to firearms. ?Diet, exercise, and sleep habits. ?Work and work Statistician. ?Sunscreen use. ?Safety issues such as seatbelt and bike helmet use. ?Physical exam ?Your health care provider will check your: ?Height and weight. These may be used to calculate your BMI (body mass index). BMI is a measurement that tells if you are at a healthy weight. ?Waist circumference. This measures the distance around your waistline. This measurement also tells if you are at a healthy weight and may help predict your risk of certain diseases, such as type 2 diabetes and high blood pressure. ?Heart rate and blood pressure. ?Body temperature. ?Skin for abnormal spots. ?What immunizations do I need? ?Vaccines are usually given at various ages, according to a schedule. Your health care provider will recommend vaccines for you based on your age, medical history, and lifestyle or other factors, such as travel or where you work. ?What tests do I need? ?Screening ?Your health care provider may recommend screening tests for certain conditions. This may include: ?Lipid and cholesterol levels. ?Hepatitis C test. ?Hepatitis B test. ?HIV (human immunodeficiency virus) test. ?STI (sexually transmitted infection) testing, if you are at  risk. ?Lung cancer screening. ?Colorectal cancer screening. ?Diabetes screening. This is done by checking your blood sugar (glucose) after you have not eaten for a while (fasting). ?Mammogram. Talk with your health care provider about how often you should have regular mammograms. ?BRCA-related cancer screening. This may be done if you have a family history of breast, ovarian, tubal, or peritoneal cancers. ?Bone density scan. This is done to screen for osteoporosis. ?Talk with your health care provider about your test results, treatment options, and if necessary, the need for more tests. ?Follow these instructions at home: ?Eating and drinking ? ?Eat a diet that includes fresh fruits and vegetables, whole grains, lean protein, and low-fat dairy products. Limit your intake of foods with high amounts of sugar, saturated fats, and salt. ?Take vitamin and mineral supplements as recommended by your health care provider. ?Do not drink alcohol if your health care provider tells you not to drink. ?If you drink alcohol: ?Limit how much you have to 0-1 drink a day. ?Know how much alcohol is in your drink. In the [place] one drink equals one 12 oz bottle of beer (355 mL), one 5 oz glass of wine (148 mL), or one 1? oz glass of hard liquor (44 mL). ?Lifestyle ?Brush your teeth every morning and night with fluoride toothpaste. Floss one time each day. ?Exercise for at least 30 minutes 5 or more days each week. ?Do not use any products that contain nicotine or tobacco. These products include cigarettes, chewing tobacco, and vaping devices, such as e-cigarettes. If you need help quitting, ask your health care provider. ?Do not use drugs. ?If you are sexually active, practice safe sex. Use a condom or other form of protection in order to prevent STIs. ?Take aspirin only as told by your  health care provider. Make sure that you understand how much to take and what form to take. Work with your health care provider to find out whether it  is safe and beneficial for you to take aspirin daily. ?Ask your health care provider if you need to take a cholesterol-lowering medicine (statin). ?Find healthy ways to manage stress, such as: ?Meditation, yoga, or listening to music. ?Journaling. ?Talking to a trusted person. ?Spending time with friends and family. ?Minimize exposure to UV radiation to reduce your risk of skin cancer. ?Safety ?Always wear your seat belt while driving or riding in a vehicle. ?Do not drive: ?If you have been drinking alcohol. Do not ride with someone who has been drinking. ?When you are tired or distracted. ?While texting. ?If you have been using any mind-altering substances or drugs. ?Wear a helmet and other protective equipment during sports activities. ?If you have firearms in your house, make sure you follow all gun safety procedures. ?What's next? ?Visit your health care provider once a year for an annual wellness visit. ?Ask your health care provider how often you should have your eyes and teeth checked. ?Stay up to date on all vaccines. ?This information is not intended to replace advice given to you by your health care provider. Make sure you discuss any questions you have with your health care provider. ?Document Revised: 10/22/2020 Document Reviewed: 10/22/2020 ?Elsevier Patient Education ? [place]. ? ?

## 2021-07-08 NOTE — Progress Notes (Signed)
?Amanda Schmitt - 76 y.o. female MRN [id number]  Date of birth: [birth year] ? ?Subjective ?Chief Complaint  ?Patient presents with  ? Annual Exam  ? ? ?HPI ?Amanda Schmitt is a 76 year old female here today for annual exam.  Reports she is doing well at this time. ? ?Reports she was recently traveling to [place] with her family and noticed a short period of time where she had difficulty with speaking.  This only lasted a few seconds before resolving.  She has not had any symptoms since. ? ?She continues to do well with Lexapro.  Feels like she may need to change Ambien due to some continued insomnia. ? ?She does try to stay fairly active.  She does like her diet is pretty healthy.   ? ?She is a non-smoker.  She does consume alcohol occasionally. ? ?Review of Systems  ?Constitutional:  Negative for chills, fever, malaise/fatigue and weight loss.  ?HENT:  Negative for congestion, ear pain and sore throat.   ?Eyes:  Negative for blurred vision, double vision and pain.  ?Respiratory:  Negative for cough and shortness of breath.   ?Cardiovascular:  Negative for chest pain and palpitations.  ?Gastrointestinal:  Negative for abdominal pain, blood in stool, constipation, heartburn and nausea.  ?Genitourinary:  Negative for dysuria and urgency.  ?Musculoskeletal:  Negative for joint pain and myalgias.  ?Neurological:  Negative for dizziness and headaches.  ?Endo/Heme/Allergies:  Does not bruise/bleed easily.  ?Psychiatric/Behavioral:  Negative for depression. The patient is not nervous/anxious and does not have insomnia.   ? ?Allergies  ?Allergen Reactions  ? Moxifloxacin Other (See Comments)  ?  Flu like symptoms  ? Codeine Nausea Only  ? Nitrofuran Derivatives   ? Tramadol Nausea Only  ? ? ?Past Medical History:  ?Diagnosis Date  ? Depression   ? ? ?Past Surgical History:  ?Procedure Laterality Date  ? ABDOMINAL HYSTERECTOMY    ? ABDOMINAL HYSTERECTOMY    ? Bowel blockage    ? BOWEL RESECTION    ? CESAREAN SECTION    ? ? ?Social  History  ? ?Socioeconomic History  ? Marital status: Widowed  ?  Spouse name: Not on file  ? Number of children: 2  ? Years of education: 82  ? Highest education level: Bachelor's degree (e.g., BA, AB, BS)  ?Occupational History  ? 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 Smoking status: Former  ?  Packs/day: 0.50  ?  Years: 26.00  ?  Pack years: 13.00  ?  Types: Cigarettes  ?  Quit date: 1999  ?  Years since quitting: 24.1  ? Smokeless tobacco: Never  ?Substance and Sexual Activity  ? Alcohol use: Yes  ?  Alcohol/week: 2.0 standard drinks  ?  Types: 2 Glasses of wine per week  ?  Comment: per night  ? Drug use: No  ? Sexual activity: Not Currently  ?Other Topics Concern  ? Not on file  ?Social History Narrative  ? 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 Enjoys doing crossword puzzles, reading and painting furniture.  ? ?Social Determinants of Health  ? ?Financial Resource Strain: Not on file  ?Food Insecurity: Not on file  ?Transportation Needs: Not on file  ?Physical Activity: Not on file  ?Stress: Not on file  ?Social Connections: Not on file  ? ? ?Family History  ?Problem Relation Age of Onset  ? Cancer Father   ?     unorgin  ? ? ?  Health Maintenance  ?Topic Date Due  ? Hepatitis C Screening  Never done  ? COVID-19 Vaccine (5 - Booster for Pfizer series) 11/07/2020  ? MAMMOGRAM  08/15/2021  ? TETANUS/TDAP  08/24/2026  ? Pneumonia Vaccine 34+ Years old  Completed  ? INFLUENZA VACCINE  Completed  ? DEXA SCAN  Completed  ? Zoster Vaccines- Shingrix  Completed  ? HPV VACCINES  Aged Out  ? COLONOSCOPY (Pts 45-39yrs Insurance coverage will need to be confirmed)  Discontinued  ? ? ? ?----------------------------------------------------------------------------------------------------------------------------------------------------------------------------------------------------------------- ?Physical Exam ?BP 131/77   Pulse 60   Temp 98.1 ?F (36.7 ?C)   Ht 5' (1.524 m)   Wt 128 lb (58.1  kg)   SpO2 98%   BMI 25.00 kg/m?  ? ?Physical Exam ?Constitutional:   ?   General: She is not in acute distress. ?HENT:  ?   Head: Normocephalic and atraumatic.  ?   Right Ear: Tympanic membrane and ear canal normal.  ?   Left Ear: Tympanic membrane and ear canal normal.  ?   Nose: Nose normal.  ?Eyes:  ?   General: No scleral icterus. ?   Conjunctiva/sclera: Conjunctivae normal.  ?Neck:  ?   Thyroid: No thyromegaly.  ?Cardiovascular:  ?   Rate and Rhythm: Normal rate and regular rhythm.  ?   Heart sounds: Normal heart sounds.  ?Pulmonary:  ?   Effort: Pulmonary effort is normal.  ?   Breath sounds: Normal breath sounds.  ?Abdominal:  ?   General: Bowel sounds are normal. There is no distension.  ?   Palpations: Abdomen is soft.  ?   Tenderness: There is no abdominal tenderness. There is no guarding.  ?Musculoskeletal:     ?   General: Normal range of motion.  ?   Cervical back: Normal range of motion and neck supple.  ?Lymphadenopathy:  ?   Cervical: No cervical adenopathy.  ?Skin: ?   General: Skin is warm and dry.  ?   Findings: No rash.  ?Neurological:  ?   General: No focal deficit present.  ?   Mental Status: She is alert and oriented to person, place, and time.  ?   Cranial Nerves: No cranial nerve deficit.  ?   Coordination: Coordination normal.  ?Psychiatric:     ?   Mood and Affect: Mood normal.     ?   Behavior: Behavior normal.  ? ? ?------------------------------------------------------------------------------------------------------------------------------------------------------------------------------------------------------------------- ?Assessment and Plan ? ?Well adult exam ?Well adult ?Orders Placed This Encounter  ?Procedures  ? COMPLETE METABOLIC PANEL WITH GFR  ? CBC with Differential  ? Lipid Panel w/reflex Direct LDL  ? Vitamin D (25 hydroxy)  ?Screenings: Per lab orders ?Immunizations: Up-to-date ?Anticipatory guidance/risk factor reduction: Recommendations per AVS ? ?Transient speech  disturbance ?Occurred once and has not had any other symptoms since that time.  Instructed to contact clinic if symptoms return or seek emergency care. ? ? ?No orders of the defined types were placed in this encounter. ? ? ?No follow-ups on file. ? ? ? ?This visit occurred during the SARS-CoV-2 public health emergency.  Safety protocols were in place, including screening questions prior to the visit, additional usage of staff PPE, and extensive cleaning of exam room while observing appropriate contact time as indicated for disinfecting solutions.  ? ?

## 2021-07-08 NOTE — Assessment & Plan Note (Signed)
Occurred once and has not had any other symptoms since that time.  Instructed to contact clinic if symptoms return or seek emergency care. ?

## 2021-07-08 NOTE — Addendum Note (Signed)
Addended by: Perlie Mayo on: 07/08/2021 10:25 PM ? ? Modules accepted: Orders ? ?

## 2021-07-08 NOTE — Assessment & Plan Note (Signed)
Well adult ?Orders Placed This Encounter  ?Procedures  ?? COMPLETE METABOLIC PANEL WITH GFR  ?? CBC with Differential  ?? Lipid Panel w/reflex Direct LDL  ?? Vitamin D (25 hydroxy)  ?Screenings: Per lab orders ?Immunizations: Up-to-date ?Anticipatory guidance/risk factor reduction: Recommendations per AVS ?

## 2021-07-17 ENCOUNTER — Encounter: Payer: Self-pay | Admitting: Family Medicine

## 2021-07-17 ENCOUNTER — Other Ambulatory Visit: Payer: Self-pay | Admitting: Family Medicine

## 2021-07-17 DIAGNOSIS — D7589 Other specified diseases of blood and blood-forming organs: Secondary | ICD-10-CM | POA: Diagnosis not present

## 2021-07-17 DIAGNOSIS — E538 Deficiency of other specified B group vitamins: Secondary | ICD-10-CM | POA: Diagnosis not present

## 2021-07-18 LAB — IRON,TIBC AND FERRITIN PANEL
%SAT: 66 % (calc) — ABNORMAL HIGH (ref 16–45)
Ferritin: 76 ng/mL (ref 16–288)
Iron: 181 ug/dL — ABNORMAL HIGH (ref 45–160)
TIBC: 274 mcg/dL (calc) (ref 250–450)

## 2021-07-18 LAB — VITAMIN B12: Vitamin B-12: 233 pg/mL (ref 200–1100)

## 2021-07-18 LAB — FOLATE: Folate: 13.7 ng/mL

## 2021-07-20 ENCOUNTER — Encounter: Payer: Self-pay | Admitting: Family Medicine

## 2021-07-22 ENCOUNTER — Ambulatory Visit: Payer: Medicare PPO | Admitting: Sports Medicine

## 2021-07-22 ENCOUNTER — Other Ambulatory Visit: Payer: Self-pay

## 2021-07-22 ENCOUNTER — Ambulatory Visit (INDEPENDENT_AMBULATORY_CARE_PROVIDER_SITE_OTHER): Payer: Medicare PPO

## 2021-07-22 DIAGNOSIS — M7061 Trochanteric bursitis, right hip: Secondary | ICD-10-CM

## 2021-07-22 DIAGNOSIS — M7062 Trochanteric bursitis, left hip: Secondary | ICD-10-CM

## 2021-07-22 NOTE — Progress Notes (Signed)
? ? ?  Procedures performed today:   ? ?Procedure: Real-time Ultrasound Guided injection of the right greater trochanteric bursa ?Device: Samsung HS60  ?Verbal informed consent obtained.  ?Time-out conducted.  ?Noted no overlying erythema, induration, or other signs of local infection.  ?Skin prepped in a sterile fashion.  ?Local anesthesia: Topical Ethyl chloride.  ?With sterile technique and under real time ultrasound guidance: Noted normal-appearing bursa, 1 cc Kenalog 40, 2 cc lidocaine, 2 cc bupivacaine injected easily ?Completed without difficulty  ?Advised to call if fevers/chills, erythema, induration, drainage, or persistent bleeding.  ?Images permanently stored and available for review in PACS.  ?Impression: Technically successful ultrasound guided injection. ? ?Independent interpretation of notes and tests performed by another provider:  ? ?None. ? ?Brief History, Exam, Impression, and Recommendations:   ? ?Trochanteric bursitis of both hips ?Pleasant 76 year old female, known bilateral trochanteric bursitis, right-sided bursa last injected in October 2022, left side injected December 2022, recurrence of pain right side, repeat right trochanteric bursa injection today. ?Lumbar spine MRI was normal. ?We also added topical nitroglycerin at a previous visit. ?Baclofen has worked really well on the left side, she will add accorded nitroglycerin patch to the right side as well. ?Has discontinued Lyrica due to excessive sedation and constipation. ? ? ? ?___________________________________________ ?Ihor Austin. [name], M.D., ABFM., CAQSM. ?Primary Care and Sports Medicine ?[facility] ? ?Adjunct Instructor of Family Medicine  ?[employer] ?

## 2021-07-22 NOTE — Assessment & Plan Note (Addendum)
Pleasant 76 year old female, known bilateral trochanteric bursitis, right-sided bursa last injected in October 2022, left side injected December 2022, recurrence of pain right side, repeat right trochanteric bursa injection today. ?Lumbar spine MRI was normal. ?We also added topical nitroglycerin at a previous visit. ?Baclofen has worked really well on the left side, she will add accorded nitroglycerin patch to the right side as well. ?Has discontinued Lyrica due to excessive sedation and constipation. ?

## 2021-07-23 ENCOUNTER — Ambulatory Visit (INDEPENDENT_AMBULATORY_CARE_PROVIDER_SITE_OTHER): Payer: Medicare PPO | Admitting: Family Medicine

## 2021-07-23 VITALS — BP 136/64 | HR 68

## 2021-07-23 DIAGNOSIS — E538 Deficiency of other specified B group vitamins: Secondary | ICD-10-CM | POA: Diagnosis not present

## 2021-07-23 MED ORDER — CYANOCOBALAMIN 1000 MCG/ML IJ SOLN
1000.0000 ug | Freq: Once | INTRAMUSCULAR | Status: AC
  Administered 2021-07-23: 1000 ug via INTRAMUSCULAR

## 2021-07-23 NOTE — Progress Notes (Signed)
? ?Established Patient Office Visit ? ?Subjective:  ?Patient ID: Amanda Schmitt, female    DOB: June 06, 1945  Age: 76 y.o. MRN: [id number] ? ?CC:  ?Chief Complaint  ?Patient presents with  ? B12 deficiency   ? ? ?HPI ?[name] presents for vitamin B12 injection. She denies any muscle cramps, weakness or irregular heart rate.  ? ?Past Medical History:  ?Diagnosis Date  ? Depression   ? ? ?Past Surgical History:  ?Procedure Laterality Date  ? ABDOMINAL HYSTERECTOMY    ? ABDOMINAL HYSTERECTOMY    ? Bowel blockage    ? BOWEL RESECTION    ? CESAREAN SECTION    ? ? ?Family History  ?Problem Relation Age of Onset  ? Cancer Father   ?     unorgin  ? ? ?Social History  ? ?Socioeconomic History  ? Marital status: Widowed  ?  Spouse name: Not on file  ? Number of children: 2  ? Years of education: 60  ? Highest education level: Bachelor's degree (e.g., BA, AB, BS)  ?Occupational History  ? 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 Smoking status: Former  ?  Packs/day: 0.50  ?  Years: 26.00  ?  Pack years: 13.00  ?  Types: Cigarettes  ?  Quit date: 1999  ?  Years since quitting: 24.2  ? Smokeless tobacco: Never  ?Substance and Sexual Activity  ? Alcohol use: Yes  ?  Alcohol/week: 2.0 standard drinks  ?  Types: 2 Glasses of wine per week  ?  Comment: per night  ? Drug use: No  ? Sexual activity: Not Currently  ?Other Topics Concern  ? Not on file  ?Social History Narrative  ? 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 Enjoys doing crossword puzzles, reading and painting furniture.  ? ?Social Determinants of Health  ? ?Financial Resource Strain: Not on file  ?Food Insecurity: Not on file  ?Transportation Needs: Not on file  ?Physical Activity: Not on file  ?Stress: Not on file  ?Social Connections: Not on file  ?Intimate Partner Violence: Not on file  ? ? ?Outpatient Medications Prior to Visit  ?Medication Sig Dispense Refill  ? clonazePAM (KLONOPIN) 0.5 MG tablet TAKE 1 TABLET BY MOUTH 2 TIMES DAILY  AS NEEDED FOR ANXIETY. 20 tablet 1  ? escitalopram (LEXAPRO) 10 MG tablet TAKE 1 TABLET BY MOUTH EVERY DAY 90 tablet 0  ? nitroGLYCERIN (NITRODUR - DOSED IN MG/24 HR) 0.2 mg/hr patch Cut and apply 1/4 patch to most painful area q24h. 30 patch 11  ? pregabalin (LYRICA) 75 MG capsule TAKE 1 CAPSULE BY MOUTH 3 TIMES DAILY. 90 capsule 1  ? zolpidem (AMBIEN CR) 6.25 MG CR tablet Take 1 tablet (6.25 mg total) by mouth at bedtime as needed for sleep. 30 tablet 3  ? ?No facility-administered medications prior to visit.  ? ? ?Allergies  ?Allergen Reactions  ? Moxifloxacin Other (See Comments)  ?  Flu like symptoms  ? Codeine Nausea Only  ? Nitrofuran Derivatives   ? Tramadol Nausea Only  ? ? ?ROS ?Review of Systems ? ?  ?Objective:  ?  ?Physical Exam ? ?BP 136/64   Pulse 68   SpO2 96%  ?Wt Readings from Last 3 Encounters:  ?07/08/21 128 lb (58.1 kg)  ?02/23/21 131 lb 4.8 oz (59.6 kg)  ?09/23/20 127 lb 8 oz (57.8 kg)  ? ? ? ?Health Maintenance Due  ?Topic Date Due  ?  Hepatitis C Screening  Never done  ? COVID-19 Vaccine (5 - Booster for Pfizer series) 11/07/2020  ? ? ?There are no preventive care reminders to display for this patient. ? ?Lab Results  ?Component Value Date  ? TSH 2.38 09/27/2019  ? ?Lab Results  ?Component Value Date  ? WBC 6.2 07/08/2021  ? HGB 11.8 07/08/2021  ? HCT 35.3 07/08/2021  ? MCV 101.1 (H) 07/08/2021  ? PLT 283 07/08/2021  ? ?Lab Results  ?Component Value Date  ? NA 142 07/08/2021  ? K 4.7 07/08/2021  ? CO2 29 07/08/2021  ? GLUCOSE 82 07/08/2021  ? BUN 9 07/08/2021  ? CREATININE 0.67 07/08/2021  ? BILITOT 0.6 07/08/2021  ? AST 20 07/08/2021  ? ALT 8 07/08/2021  ? PROT 6.7 07/08/2021  ? CALCIUM 9.3 07/08/2021  ? EGFR 91 07/08/2021  ? ?Lab Results  ?Component Value Date  ? CHOL 226 (H) 07/08/2021  ? ?Lab Results  ?Component Value Date  ? HDL 106 07/08/2021  ? ?Lab Results  ?Component Value Date  ? LDLCALC 105 (H) 07/08/2021  ? ?Lab Results  ?Component Value Date  ? TRIG 67 07/08/2021  ? ?Lab Results   ?Component Value Date  ? CHOLHDL 2.1 07/08/2021  ? ?No results found for: HGBA1C ? ?  ?Assessment & Plan:  ? ?Patient was given B12 injection in the right deltoid. She tolerated injection well and without complications. She will return in 1 week for next injection.  ? ?Problem List Items Addressed This Visit   ?None ?Visit Diagnoses   ? ? B12 deficiency    -  Primary  ? Relevant Medications  ? cyanocobalamin ((VITAMIN B-12)) injection 1,000 mcg (Completed)  ? ?  ? ? ?Meds ordered this encounter  ?Medications  ? cyanocobalamin ((VITAMIN B-12)) injection 1,000 mcg  ? ? ?Follow-up: Return in about 1 week (around 07/30/2021) for next B12 injection..  ? ? ?Gust Brooms, CMA ?

## 2021-07-23 NOTE — Progress Notes (Signed)
Medical screening examination/treatment was performed by qualified clinical staff member and as supervising physician I was immediately available for consultation/collaboration. I have reviewed documentation and agree with assessment and plan.  Kala Gassmann, DO  

## 2021-07-28 ENCOUNTER — Other Ambulatory Visit: Payer: Self-pay | Admitting: Family Medicine

## 2021-07-30 ENCOUNTER — Other Ambulatory Visit: Payer: Self-pay

## 2021-07-30 ENCOUNTER — Ambulatory Visit (INDEPENDENT_AMBULATORY_CARE_PROVIDER_SITE_OTHER): Payer: Medicare PPO | Admitting: Family Medicine

## 2021-07-30 VITALS — BP 116/74 | HR 62

## 2021-07-30 DIAGNOSIS — E538 Deficiency of other specified B group vitamins: Secondary | ICD-10-CM

## 2021-07-30 MED ORDER — CYANOCOBALAMIN 1000 MCG/ML IJ SOLN
1000.0000 ug | Freq: Once | INTRAMUSCULAR | Status: AC
  Administered 2021-07-30: 1000 ug via INTRAMUSCULAR

## 2021-07-30 NOTE — Progress Notes (Signed)
? ?Established Patient Office Visit ? ?Subjective:  ?Patient ID: Amanda Schmitt, female    DOB: 1945/06/22  Age: 76 y.o. MRN: [id number] ? ?CC:  ?Chief Complaint  ?Patient presents with  ? B12 deficiency  ? ? ?HPI ?Amanda Schmitt presents for vitamin B12 injection. She denies any muscle cramps, weakness or irregular heart rate.  ? ?Past Medical History:  ?Diagnosis Date  ? Depression   ? ? ?Past Surgical History:  ?Procedure Laterality Date  ? ABDOMINAL HYSTERECTOMY    ? ABDOMINAL HYSTERECTOMY    ? Bowel blockage    ? BOWEL RESECTION    ? CESAREAN SECTION    ? ? ?Family History  ?Problem Relation Age of Onset  ? Cancer Father   ?     unorgin  ? ? ?Social History  ? ?Socioeconomic History  ? Marital status: Widowed  ?  Spouse name: Not on file  ? Number of children: 2  ? Years of education: 27  ? Highest education level: Bachelor's degree (e.g., BA, AB, BS)  ?Occupational History  ? 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 Smoking status: Former  ?  Packs/day: 0.50  ?  Years: 26.00  ?  Pack years: 13.00  ?  Types: Cigarettes  ?  Quit date: 1999  ?  Years since quitting: 24.2  ? Smokeless tobacco: Never  ?Substance and Sexual Activity  ? Alcohol use: Yes  ?  Alcohol/week: 2.0 standard drinks  ?  Types: 2 Glasses of wine per week  ?  Comment: per night  ? Drug use: No  ? Sexual activity: Not Currently  ?Other Topics Concern  ? Not on file  ?Social History Narrative  ? 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 Enjoys doing crossword puzzles, reading and painting furniture.  ? ?Social Determinants of Health  ? ?Financial Resource Strain: Not on file  ?Food Insecurity: Not on file  ?Transportation Needs: Not on file  ?Physical Activity: Not on file  ?Stress: Not on file  ?Social Connections: Not on file  ?Intimate Partner Violence: Not on file  ? ? ?Outpatient Medications Prior to Visit  ?Medication Sig Dispense Refill  ? clonazePAM (KLONOPIN) 0.5 MG tablet TAKE 1 TABLET BY MOUTH 2 TIMES DAILY  AS NEEDED FOR ANXIETY. 20 tablet 1  ? escitalopram (LEXAPRO) 10 MG tablet TAKE 1 TABLET BY MOUTH EVERY DAY 90 tablet 0  ? nitroGLYCERIN (NITRODUR - DOSED IN MG/24 HR) 0.2 mg/hr patch Cut and apply 1/4 patch to most painful area q24h. 30 patch 11  ? pregabalin (LYRICA) 75 MG capsule TAKE 1 CAPSULE BY MOUTH 3 TIMES DAILY. 90 capsule 1  ? zolpidem (AMBIEN CR) 6.25 MG CR tablet Take 1 tablet (6.25 mg total) by mouth at bedtime as needed for sleep. 30 tablet 3  ? ?No facility-administered medications prior to visit.  ? ? ?Allergies  ?Allergen Reactions  ? Moxifloxacin Other (See Comments)  ?  Flu like symptoms  ? Codeine Nausea Only  ? Nitrofuran Derivatives   ? Tramadol Nausea Only  ? ? ?ROS ?Review of Systems ? ?  ?Objective:  ?  ?Physical Exam ? ?BP 116/74   Pulse 62   SpO2 99%  ?Wt Readings from Last 3 Encounters:  ?07/08/21 128 lb (58.1 kg)  ?02/23/21 131 lb 4.8 oz (59.6 kg)  ?09/23/20 127 lb 8 oz (57.8 kg)  ? ? ? ?Health Maintenance Due  ?Topic Date Due  ? Hepatitis  C Screening  Never done  ? COVID-19 Vaccine (5 - Booster for Pfizer series) 11/07/2020  ? ? ?There are no preventive care reminders to display for this patient. ? ?Lab Results  ?Component Value Date  ? TSH 2.38 09/27/2019  ? ?Lab Results  ?Component Value Date  ? WBC 6.2 07/08/2021  ? HGB 11.8 07/08/2021  ? HCT 35.3 07/08/2021  ? MCV 101.1 (H) 07/08/2021  ? PLT 283 07/08/2021  ? ?Lab Results  ?Component Value Date  ? NA 142 07/08/2021  ? K 4.7 07/08/2021  ? CO2 29 07/08/2021  ? GLUCOSE 82 07/08/2021  ? BUN 9 07/08/2021  ? CREATININE 0.67 07/08/2021  ? BILITOT 0.6 07/08/2021  ? AST 20 07/08/2021  ? ALT 8 07/08/2021  ? PROT 6.7 07/08/2021  ? CALCIUM 9.3 07/08/2021  ? EGFR 91 07/08/2021  ? ?Lab Results  ?Component Value Date  ? CHOL 226 (H) 07/08/2021  ? ?Lab Results  ?Component Value Date  ? HDL 106 07/08/2021  ? ?Lab Results  ?Component Value Date  ? LDLCALC 105 (H) 07/08/2021  ? ?Lab Results  ?Component Value Date  ? TRIG 67 07/08/2021  ? ?Lab Results   ?Component Value Date  ? CHOLHDL 2.1 07/08/2021  ? ?No results found for: HGBA1C ? ?  ?Assessment & Plan:  ? ?Patient was given B12 injection in the left deltoid. She tolerated injection well and without complications. She will return in 1 week for next injection.  ? ?Problem List Items Addressed This Visit   ?None ?Visit Diagnoses   ? ? B12 deficiency    -  Primary  ? Relevant Medications  ? cyanocobalamin ((VITAMIN B-12)) injection 1,000 mcg (Start on 07/30/2021  1:45 PM)  ? ?  ? ? ?Meds ordered this encounter  ?Medications  ? cyanocobalamin ((VITAMIN B-12)) injection 1,000 mcg  ? ? ?Follow-up: Return in about 1 week (around 08/06/2021) for next B12 injection.  ? ? ?Gust Brooms, CMA ?

## 2021-07-30 NOTE — Progress Notes (Signed)
Medical screening examination/treatment was performed by qualified clinical staff member and as supervising physician I was immediately available for consultation/collaboration. I have reviewed documentation and agree with assessment and plan.  Paris Chiriboga, DO  

## 2021-08-06 ENCOUNTER — Ambulatory Visit (INDEPENDENT_AMBULATORY_CARE_PROVIDER_SITE_OTHER): Payer: Medicare PPO | Admitting: Family Medicine

## 2021-08-06 VITALS — BP 124/67 | HR 61 | Ht 60.0 in

## 2021-08-06 DIAGNOSIS — E538 Deficiency of other specified B group vitamins: Secondary | ICD-10-CM

## 2021-08-06 MED ORDER — CYANOCOBALAMIN 1000 MCG/ML IJ SOLN
1000.0000 ug | Freq: Once | INTRAMUSCULAR | Status: AC
  Administered 2021-08-06: 1000 ug via INTRAMUSCULAR

## 2021-08-06 NOTE — Progress Notes (Signed)
? ?Established Patient Office Visit ? ?Subjective:  ?Patient ID: Amanda Schmitt, female    DOB: January 08, 1946  Age: 76 y.o. MRN: [id number] ? ?CC: No chief complaint on file. ? ? ?HPI ?[name] presents for vitamin B12 deficiency. Denies muscle cramps,weakness or irregular heart rate. ? ?Past Medical History:  ?Diagnosis Date  ? Depression   ? ? ?Past Surgical History:  ?Procedure Laterality Date  ? ABDOMINAL HYSTERECTOMY    ? ABDOMINAL HYSTERECTOMY    ? Bowel blockage    ? BOWEL RESECTION    ? CESAREAN SECTION    ? ? ?Family History  ?Problem Relation Age of Onset  ? Cancer Father   ?     unorgin  ? ? ?Social History  ? ?Socioeconomic History  ? Marital status: Widowed  ?  Spouse name: Not on file  ? Number of children: 2  ? Years of education: 68  ? Highest education level: Bachelor's degree (e.g., BA, AB, BS)  ?Occupational History  ? 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 Smoking status: Former  ?  Packs/day: 0.50  ?  Years: 26.00  ?  Pack years: 13.00  ?  Types: Cigarettes  ?  Quit date: 1999  ?  Years since quitting: 24.2  ? Smokeless tobacco: Never  ?Substance and Sexual Activity  ? Alcohol use: Yes  ?  Alcohol/week: 2.0 standard drinks  ?  Types: 2 Glasses of wine per week  ?  Comment: per night  ? Drug use: No  ? Sexual activity: Not Currently  ?Other Topics Concern  ? Not on file  ?Social History Narrative  ? 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 Enjoys doing crossword puzzles, reading and painting furniture.  ? ?Social Determinants of Health  ? ?Financial Resource Strain: Not on file  ?Food Insecurity: Not on file  ?Transportation Needs: Not on file  ?Physical Activity: Not on file  ?Stress: Not on file  ?Social Connections: Not on file  ?Intimate Partner Violence: Not on file  ? ? ?Outpatient Medications Prior to Visit  ?Medication Sig Dispense Refill  ? clonazePAM (KLONOPIN) 0.5 MG tablet TAKE 1 TABLET BY MOUTH 2 TIMES DAILY AS NEEDED FOR ANXIETY. 20 tablet 1  ?  escitalopram (LEXAPRO) 10 MG tablet TAKE 1 TABLET BY MOUTH EVERY DAY 90 tablet 0  ? nitroGLYCERIN (NITRODUR - DOSED IN MG/24 HR) 0.2 mg/hr patch Cut and apply 1/4 patch to most painful area q24h. 30 patch 11  ? pregabalin (LYRICA) 75 MG capsule TAKE 1 CAPSULE BY MOUTH 3 TIMES DAILY. 90 capsule 1  ? zolpidem (AMBIEN CR) 6.25 MG CR tablet Take 1 tablet (6.25 mg total) by mouth at bedtime as needed for sleep. 30 tablet 3  ? ?No facility-administered medications prior to visit.  ? ? ?Allergies  ?Allergen Reactions  ? Moxifloxacin Other (See Comments)  ?  Flu like symptoms  ? Codeine Nausea Only  ? Nitrofuran Derivatives   ? Tramadol Nausea Only  ? ? ?ROS ?Review of Systems ? ?  ?Objective:  ?  ?Physical Exam ? ?BP 124/67   Pulse 61   Ht 5' (1.524 m)   SpO2 98%   BMI 25.00 kg/m?  ?Wt Readings from Last 3 Encounters:  ?07/08/21 128 lb (58.1 kg)  ?02/23/21 131 lb 4.8 oz (59.6 kg)  ?09/23/20 127 lb 8 oz (57.8 kg)  ? ? ? ?Health Maintenance Due  ?Topic Date Due  ? Hepatitis  C Screening  Never done  ? COVID-19 Vaccine (5 - Booster for Pfizer series) 11/07/2020  ? ? ?There are no preventive care reminders to display for this patient. ? ?Lab Results  ?Component Value Date  ? TSH 2.38 09/27/2019  ? ?Lab Results  ?Component Value Date  ? WBC 6.2 07/08/2021  ? HGB 11.8 07/08/2021  ? HCT 35.3 07/08/2021  ? MCV 101.1 (H) 07/08/2021  ? PLT 283 07/08/2021  ? ?Lab Results  ?Component Value Date  ? NA 142 07/08/2021  ? K 4.7 07/08/2021  ? CO2 29 07/08/2021  ? GLUCOSE 82 07/08/2021  ? BUN 9 07/08/2021  ? CREATININE 0.67 07/08/2021  ? BILITOT 0.6 07/08/2021  ? AST 20 07/08/2021  ? ALT 8 07/08/2021  ? PROT 6.7 07/08/2021  ? CALCIUM 9.3 07/08/2021  ? EGFR 91 07/08/2021  ? ?Lab Results  ?Component Value Date  ? CHOL 226 (H) 07/08/2021  ? ?Lab Results  ?Component Value Date  ? HDL 106 07/08/2021  ? ?Lab Results  ?Component Value Date  ? LDLCALC 105 (H) 07/08/2021  ? ?Lab Results  ?Component Value Date  ? TRIG 67 07/08/2021  ? ?Lab Results   ?Component Value Date  ? CHOLHDL 2.1 07/08/2021  ? ?No results found for: HGBA1C ? ?  ?Assessment & Plan:  ? ?B12 injection was given in the right deltoid. Patient tolerated injection well and without complications. ? ?Problem List Items Addressed This Visit   ?None ?Visit Diagnoses   ? ? B12 deficiency    -  Primary  ? Relevant Medications  ? cyanocobalamin ((VITAMIN B-12)) injection 1,000 mcg (Completed) (Start on 08/06/2021  3:15 PM)  ? ?  ? ? ?Meds ordered this encounter  ?Medications  ? cyanocobalamin ((VITAMIN B-12)) injection 1,000 mcg  ? ? ?Follow-up: in 1 week for next B12 injection. ? ? ?Gust Brooms, CMA ?

## 2021-08-06 NOTE — Progress Notes (Signed)
Medical screening examination/treatment was performed by qualified clinical staff member and as supervising physician I was immediately available for consultation/collaboration. I have reviewed documentation and agree with assessment and plan.  Maribella Kuna, DO  

## 2021-08-13 ENCOUNTER — Ambulatory Visit (INDEPENDENT_AMBULATORY_CARE_PROVIDER_SITE_OTHER): Payer: Medicare PPO | Admitting: Medical-Surgical

## 2021-08-13 VITALS — BP 134/70 | HR 70 | Resp 16 | Ht 60.0 in | Wt 125.0 lb

## 2021-08-13 DIAGNOSIS — E538 Deficiency of other specified B group vitamins: Secondary | ICD-10-CM

## 2021-08-13 MED ORDER — CYANOCOBALAMIN 1000 MCG/ML IJ SOLN
1000.0000 ug | Freq: Once | INTRAMUSCULAR | Status: AC
  Administered 2021-08-13: 1000 ug via INTRAMUSCULAR

## 2021-08-13 NOTE — Progress Notes (Signed)
Patient in office for B 12 injection. B12 injection given in left deltoid. Patient tolerated injection well with no side effects.  ?

## 2021-08-13 NOTE — Progress Notes (Signed)
Agree with documentation as above.  ? ?___________________________________________ ?Zafiro Routson L. Kahlen Boyde, DNP, APRN, FNP-BC ?Primary Care and Sports Medicine ?[facility] ? ?

## 2021-08-21 DIAGNOSIS — Z1231 Encounter for screening mammogram for malignant neoplasm of breast: Secondary | ICD-10-CM | POA: Diagnosis not present

## 2021-08-27 DIAGNOSIS — H52223 Regular astigmatism, bilateral: Secondary | ICD-10-CM | POA: Diagnosis not present

## 2021-08-27 DIAGNOSIS — Z961 Presence of intraocular lens: Secondary | ICD-10-CM | POA: Diagnosis not present

## 2021-08-27 DIAGNOSIS — H524 Presbyopia: Secondary | ICD-10-CM | POA: Diagnosis not present

## 2021-08-27 DIAGNOSIS — H26492 Other secondary cataract, left eye: Secondary | ICD-10-CM | POA: Diagnosis not present

## 2021-08-27 DIAGNOSIS — H5202 Hypermetropia, left eye: Secondary | ICD-10-CM | POA: Diagnosis not present

## 2021-08-27 DIAGNOSIS — H33103 Unspecified retinoschisis, bilateral: Secondary | ICD-10-CM | POA: Diagnosis not present

## 2021-09-06 ENCOUNTER — Other Ambulatory Visit: Payer: Self-pay | Admitting: Family Medicine

## 2021-09-10 ENCOUNTER — Ambulatory Visit (INDEPENDENT_AMBULATORY_CARE_PROVIDER_SITE_OTHER): Payer: Medicare PPO | Admitting: Family Medicine

## 2021-09-10 VITALS — BP 125/78 | HR 91

## 2021-09-10 DIAGNOSIS — E538 Deficiency of other specified B group vitamins: Secondary | ICD-10-CM | POA: Diagnosis not present

## 2021-09-10 MED ORDER — CYANOCOBALAMIN 1000 MCG/ML IJ SOLN
1000.0000 ug | Freq: Once | INTRAMUSCULAR | Status: AC
  Administered 2021-09-10: 1000 ug via INTRAMUSCULAR

## 2021-09-10 NOTE — Progress Notes (Signed)
Medical screening examination/treatment was performed by qualified clinical staff member and as supervising physician I was immediately available for consultation/collaboration. I have reviewed documentation and agree with assessment and plan.  Oneal Biglow, DO  

## 2021-09-10 NOTE — Progress Notes (Signed)
Patient presents today for cyanocobalamin 1000 mcg/1 ml injection. Patient is scheduled to get this injection every 30 days. Patients last injection was given on 08/13/2021 in the left arm.  ? ?Patient denies CP, palpitations, SOB, dizziness/lightheadedness, abdominal pain, GI upset, headache, and mood swings.  ? ?Injection given in right arm. Pt tolerated injection well without complications. Pt instructed to stop at the front desk to schedule a nurse visit for the next injection that will be due in 30 days.  ?

## 2021-10-09 ENCOUNTER — Ambulatory Visit: Payer: Medicare PPO | Admitting: Sports Medicine

## 2021-10-09 DIAGNOSIS — M5416 Radiculopathy, lumbar region: Secondary | ICD-10-CM | POA: Diagnosis not present

## 2021-10-09 MED ORDER — CYCLOBENZAPRINE HCL 10 MG PO TABS: ORAL_TABLET | ORAL | 0 refills | Status: DC

## 2021-10-09 MED ORDER — PREDNISONE 50 MG PO TABS: ORAL_TABLET | ORAL | 0 refills | Status: DC

## 2021-10-09 NOTE — Assessment & Plan Note (Signed)
Pleasant 76 year old female, known lumbar DDD L4-5 and L5-S1 with close contact of the right L5 nerve root with the intra spinal disc. She has been having increasing pain in her low back right worse than left radiation down the back of the leg sometimes to the bottom of the right foot. Today she really does not have much tenderness over the trochanteric bursae, so I think most of her pain is coming from her lumbar spine. She also did not have much tenderness directly over the sacroiliac joint. Proceed with 5 days of prednisone, home conditioning, cyclobenzaprine at night. Return to see me in 6 weeks for epidural if not better.

## 2021-10-09 NOTE — Progress Notes (Signed)
    Procedures performed today:    MRI personally reviewed, she does have mild lower lumbar facet arthritis, she also has a disc retrusion L5-S1 that comes close to the intraspinal right L5 nerve.  Independent interpretation of notes and tests performed by another provider:   None.  Brief History, Exam, Impression, and Recommendations:    Right lumbar radiculitis Pleasant 76 year old female, known lumbar DDD L4-5 and L5-S1 with close contact of the right L5 nerve root with the intra spinal disc. She has been having increasing pain in her low back right worse than left radiation down the back of the leg sometimes to the bottom of the right foot. Today she really does not have much tenderness over the trochanteric bursae, so I think most of her pain is coming from her lumbar spine. She also did not have much tenderness directly over the sacroiliac joint. Proceed with 5 days of prednisone, home conditioning, cyclobenzaprine at night. Return to see me in 6 weeks for epidural if not better.    ___________________________________________ Ihor Austin. [name], M.D., ABFM., CAQSM. Primary Care and Sports Medicine [facility]  Adjunct Instructor of Family Medicine  [employer]

## 2021-10-12 ENCOUNTER — Ambulatory Visit (INDEPENDENT_AMBULATORY_CARE_PROVIDER_SITE_OTHER): Payer: Medicare PPO | Admitting: Family Medicine

## 2021-10-12 VITALS — BP 118/56 | HR 73

## 2021-10-12 DIAGNOSIS — E538 Deficiency of other specified B group vitamins: Secondary | ICD-10-CM | POA: Diagnosis not present

## 2021-10-12 MED ORDER — CYANOCOBALAMIN 1000 MCG/ML IJ SOLN
1000.0000 ug | Freq: Once | INTRAMUSCULAR | Status: AC
  Administered 2021-10-12: 1000 ug via INTRAMUSCULAR

## 2021-10-12 NOTE — Progress Notes (Signed)
Pt is here for a vitamin B12 injection.  Pt denies gastrointestinal problems or dizziness. T. Brytni Dray, CMA 

## 2021-10-12 NOTE — Progress Notes (Signed)
Medical screening examination/treatment was performed by qualified clinical staff member and as supervising physician I was immediately available for consultation/collaboration. I have reviewed documentation and agree with assessment and plan.  Russ Looper, DO  

## 2021-10-23 ENCOUNTER — Other Ambulatory Visit: Payer: Self-pay | Admitting: Family Medicine

## 2021-11-04 ENCOUNTER — Other Ambulatory Visit: Payer: Self-pay | Admitting: Family Medicine

## 2021-11-06 ENCOUNTER — Ambulatory Visit (INDEPENDENT_AMBULATORY_CARE_PROVIDER_SITE_OTHER): Payer: Medicare PPO

## 2021-11-06 ENCOUNTER — Ambulatory Visit: Payer: Medicare PPO | Admitting: Sports Medicine

## 2021-11-06 DIAGNOSIS — M7061 Trochanteric bursitis, right hip: Secondary | ICD-10-CM | POA: Diagnosis not present

## 2021-11-06 DIAGNOSIS — M7062 Trochanteric bursitis, left hip: Secondary | ICD-10-CM

## 2021-11-06 NOTE — Assessment & Plan Note (Signed)
This is a very pleasant 76 year old female, she has known bilateral trochanteric bursitis, the right-sided bursa was last injected in March 2023, the last side December 2022, she is having recurrence of pain, left-sided, unable to lay on the ipsilateral side, tender to palpation. She is agreeable to try a left greater trochanteric bursa injection today. Return to see me as needed.

## 2021-11-06 NOTE — Progress Notes (Signed)
    Procedures performed today:    Procedure: Real-time Ultrasound Guided injection of the left greater trochanter bursa Device: Samsung [id number]  Verbal informed consent obtained.  Time-out conducted.  Noted no overlying erythema, induration, or other signs of local infection.  Skin prepped in a sterile fashion.  Local anesthesia: Topical Ethyl chloride.  With sterile technique and under real time ultrasound guidance: Normal-appearing bursa noted, 1 cc Kenalog 40, 2 cc lidocaine, 2 cc bupivacaine injected easily Completed without difficulty  Advised to call if fevers/chills, erythema, induration, drainage, or persistent bleeding.  Images permanently stored and available for review in PACS.  Impression: Technically successful ultrasound guided injection.  Independent interpretation of notes and tests performed by another provider:   None.  Brief History, Exam, Impression, and Recommendations:    Trochanteric bursitis of both hips This is a very pleasant 76 year old female, she has known bilateral trochanteric bursitis, the right-sided bursa was last injected in March 2023, the last side December 2022, she is having recurrence of pain, left-sided, unable to lay on the ipsilateral side, tender to palpation. She is agreeable to try a left greater trochanteric bursa injection today. Return to see me as needed.  Chronic process with exacerbation and pharmacologic intervention  ____________________________________________ Ihor Austin. [name], M.D., ABFM., CAQSM., AME. Primary Care and Sports Medicine [facility]  Adjunct Professor of Family Medicine  [employer]  Restaurant manager, fast food

## 2021-11-11 ENCOUNTER — Ambulatory Visit (INDEPENDENT_AMBULATORY_CARE_PROVIDER_SITE_OTHER): Payer: Medicare PPO | Admitting: Family Medicine

## 2021-11-11 VITALS — BP 149/78 | HR 66

## 2021-11-11 DIAGNOSIS — E538 Deficiency of other specified B group vitamins: Secondary | ICD-10-CM | POA: Diagnosis not present

## 2021-11-11 MED ORDER — CYANOCOBALAMIN 1000 MCG/ML IJ SOLN
1000.0000 ug | Freq: Once | INTRAMUSCULAR | Status: AC
  Administered 2021-11-11: 1000 ug via INTRAMUSCULAR

## 2021-11-11 NOTE — Progress Notes (Signed)
Medical screening examination/treatment was performed by qualified clinical staff member and as supervising physician I was immediately available for consultation/collaboration. I have reviewed documentation and agree with assessment and plan.  Maxen Rowland, DO  

## 2021-11-11 NOTE — Progress Notes (Signed)
Vitamin B12 injection. Denies muscle cramps, weakness or irregular heart rate.  Administered right deltoid.  Schedule 4 weeks nurse for B-12 injection.  Patients Blood pressure was elevated. She will check it at home and send you the reading through MyChart if they are over the recommended BP readings of 130/90 to contact her provider.

## 2021-11-18 ENCOUNTER — Ambulatory Visit (INDEPENDENT_AMBULATORY_CARE_PROVIDER_SITE_OTHER): Payer: Medicare PPO | Admitting: Family Medicine

## 2021-11-18 DIAGNOSIS — Z0000 Encounter for general adult medical examination without abnormal findings: Secondary | ICD-10-CM

## 2021-11-18 DIAGNOSIS — Z780 Asymptomatic menopausal state: Secondary | ICD-10-CM | POA: Diagnosis not present

## 2021-11-18 NOTE — Progress Notes (Signed)
MEDICARE ANNUAL WELLNESS VISIT  11/18/2021  Telephone Visit Disclaimer This Medicare AWV was conducted by telephone due to national recommendations for restrictions regarding the COVID-19 Pandemic (e.g. social distancing).  I verified, using two identifiers, that I am speaking with Amanda Schmitt or their authorized healthcare agent. I discussed the limitations, risks, security, and privacy concerns of performing an evaluation and management service by telephone and the potential availability of an in-person appointment in the future. The patient expressed understanding and agreed to proceed.  Location of Patient: Home Location of Provider (nurse):  In the office.  Subjective:    Amanda Schmitt is a 76 y.o. female patient of Luetta Nutting, DO who had a Medicare Annual Wellness Visit today via telephone. Amanda Schmitt is Retired and lives alone. she has 2 children. she reports that she is socially active and does interact with friends/family regularly. she is minimally physically active and enjoys doing crossword puzzles, reading and painting furniture.  Patient Care Team: Luetta Nutting, DO as PCP - General (Family Medicine)     11/18/2021    9:57 AM 05/23/2020    1:07 PM 09/27/2019    3:45 PM  Advanced Directives  Does Patient Have a Medical Advance Directive? Yes Yes Yes  Type of Advance Directive Living will [place];Living will [place];Living will;Out of facility DNR (pink MOST or yellow form)  Does patient want to make changes to medical advance directive? No - Patient declined No - Patient declined No - Patient declined  Copy of [place] in Chart?  Yes - validated most recent copy scanned in chart (See row information)     Hospital Utilization Over the Past 12 Months: # of hospitalizations or ER visits: 0 # of surgeries: 0  Review of Systems    Patient reports that her overall health is better compared to last  year.  History obtained from chart review and the patient  Patient Reported Readings (BP, Pulse, CBG, Weight, etc) none  Pain Assessment Pain : No/denies pain     Current Medications & Allergies (verified) Allergies as of 11/18/2021       Reactions   Moxifloxacin Other (See Comments)   Flu like symptoms   Codeine Nausea Only   Nitrofuran Derivatives    Tramadol Nausea Only        Medication List        Accurate as of November 18, 2021 10:13 AM. If you have any questions, ask your nurse or doctor.          clonazePAM 0.5 MG tablet Commonly known as: KLONOPIN TAKE 1 TABLET BY MOUTH TWICE A DAY AS NEEDED FOR ANXIETY   cyclobenzaprine 10 MG tablet Commonly known as: FLEXERIL One half to one tab PO qHS, then increase gradually to one tab TID.   escitalopram 10 MG tablet Commonly known as: LEXAPRO TAKE 1 TABLET BY MOUTH EVERY DAY   nitroGLYCERIN 0.2 mg/hr patch Commonly known as: NITRODUR - Dosed in mg/24 hr Cut and apply 1/4 patch to most painful area q24h.   predniSONE 50 MG tablet Commonly known as: DELTASONE One tab PO daily for 5 days.   zolpidem 6.25 MG CR tablet Commonly known as: AMBIEN CR TAKE 1 TABLET BY MOUTH AT BEDTIME AS NEEDED FOR SLEEP.        History (reviewed): Past Medical History:  Diagnosis Date   Depression    Past Surgical History:  Procedure Laterality Date   ABDOMINAL HYSTERECTOMY  ABDOMINAL HYSTERECTOMY     Bowel blockage     BOWEL RESECTION     CESAREAN SECTION     Family History  Problem Relation Age of Onset   Cancer Father        unorgin   Social History   Socioeconomic History   Marital status: Widowed    Spouse name: Not on file   Number of children: 2   Years of education: 16   Highest education level: Bachelor's degree (e.g., BA, AB, BS)  Occupational History   Occupation: Retired    Comment: Pharmacist, hospital  Tobacco Use   Smoking status: Former    Packs/day: 0.50    Years: 26.00    Total pack years:  13.00    Types: Cigarettes    Quit date: 1999    Years since quitting: 24.5   Smokeless tobacco: Never  Substance and Sexual Activity   Alcohol use: Yes    Alcohol/week: 2.0 standard drinks of alcohol    Types: 2 Glasses of wine per week    Comment: per night   Drug use: No   Sexual activity: Not Currently  Other Topics Concern   Not on file  Social History Narrative   Lives alone. Volunteers at [employer]. Enjoys doing crossword puzzles, reading and painting furniture.   Social Determinants of Health   Financial Resource Strain: Low Risk  (05/23/2020)   Overall Financial Resource Strain (CARDIA)    Difficulty of Paying Living Expenses: Not hard at all  Food Insecurity: No Food Insecurity (11/18/2021)   Hunger Vital Sign    Worried About Running Out of Food in the Last Year: Never true    Ran Out of Food in the Last Year: Never true  Transportation Needs: No Transportation Needs (11/18/2021)   PRAPARE - Hydrologist (Medical): No    Lack of Transportation (Non-Medical): No  Physical Activity: Inactive (11/18/2021)   Exercise Vital Sign    Days of Exercise per Week: 0 days    Minutes of Exercise per Session: 0 min  Stress: No Stress Concern Present (11/18/2021)   [place]    Feeling of Stress : Not at all  Social Connections: Socially Isolated (11/18/2021)   Social Connection and Isolation Panel [NHANES]    Frequency of Communication with Friends and Family: More than three times a week    Frequency of Social Gatherings with Friends and Family: Three times a week    Attends Religious Services: Never    Active Member of Clubs or Organizations: No    Attends Archivist Meetings: Never    Marital Status: Widowed    Activities of Daily Living    11/18/2021   10:01 AM  In your present state of health, do you have any difficulty performing the following activities:   Hearing? 0  Vision? 0  Difficulty concentrating or making decisions? 0  Walking or climbing stairs? 0  Dressing or bathing? 0  Doing errands, shopping? 0  Preparing Food and eating ? N  Using the Toilet? N  In the past six months, have you accidently leaked urine? N  Do you have problems with loss of bowel control? N  Managing your Medications? N  Managing your Finances? N  Housekeeping or managing your Housekeeping? N    Patient Education/ Literacy How often do you need to have someone help you when you read instructions, pamphlets, or other written materials from  your doctor or pharmacy?: 1 - Never What is the last grade level you completed in school?: Bachelor's degree  Exercise Current Exercise Habits: The patient does not participate in regular exercise at present, Exercise limited by: orthopedic condition(s) (back pain.)  Diet Patient reports consuming 3 meals a day and 0 snack(s) a day Patient reports that her primary diet is: Regular Patient reports that she does have regular access to food.   Depression Screen    11/18/2021    9:57 AM 07/08/2021    9:11 AM 02/23/2021    2:10 PM 09/23/2020   11:30 AM 05/23/2020    1:15 PM 02/05/2020    1:43 PM 10/16/2019    4:02 PM  PHQ 2/9 Scores  PHQ - 2 Score 0 0 1 0 0 1 1  PHQ- 9 Score   5 0 0 8 5     Fall Risk    11/18/2021    9:57 AM 07/08/2021    9:10 AM 02/23/2021    2:10 PM 02/23/2021    1:15 PM 09/23/2020   11:30 AM  Fall Risk   Falls in the past year? 0 0 0 0 0  Number falls in past yr: 0 0 0 0 0  Injury with Fall? 0 0 0 0 0  Risk for fall due to : No Fall Risks No Fall Risks No Fall Risks No Fall Risks No Fall Risks  Follow up Falls evaluation completed Falls evaluation completed Falls evaluation completed Falls evaluation completed Falls evaluation completed     Objective:  Amanda Schmitt seemed alert and oriented and she participated appropriately during our telephone visit.  Blood Pressure Weight BMI  BP  Readings from Last 3 Encounters:  11/11/21 (!) 149/78  10/12/21 (!) 118/56  09/10/21 125/78   Wt Readings from Last 3 Encounters:  08/13/21 125 lb (56.7 kg)  07/08/21 128 lb (58.1 kg)  02/23/21 131 lb 4.8 oz (59.6 kg)   BMI Readings from Last 1 Encounters:  08/13/21 24.41 kg/m    *Unable to obtain current vital signs, weight, and BMI due to telephone visit type  Hearing/Vision  Amanda Schmitt did not seem to have difficulty with hearing/understanding during the telephone conversation Reports that she has had a formal eye exam by an eye care professional within the past year Reports that she has not had a formal hearing evaluation within the past year *Unable to fully assess hearing and vision during telephone visit type  Cognitive Function:    11/18/2021   10:02 AM 05/23/2020    1:08 PM  6CIT Screen  What Year? 0 points 0 points  What month? 0 points 0 points  What time? 0 points 0 points  Count back from 20 0 points 0 points  Months in reverse 0 points 0 points  Repeat phrase 0 points 0 points  Total Score 0 points 0 points   (Normal:0-7, Significant for Dysfunction: >8)  Normal Cognitive Function Screening: Yes   Immunization & Health Maintenance Record Immunization History  Administered Date(s) Administered   Fluad Quad(high Dose 65+) 02/05/2020   Influenza, High Dose Seasonal PF 02/13/2015, 03/18/2016, 02/02/2017, 02/10/2018, 01/15/2019   Influenza-Unspecified 02/23/2021   PFIZER(Purple Top)SARS-COV-2 Vaccination 06/07/2019, 07/01/2019, 02/20/2020, 09/12/2020   Pneumococcal Conjugate-13 02/13/2015   Pneumococcal Polysaccharide-23 12/31/2011   Td 06/25/2002   Tdap 08/23/2016   Zoster Recombinat (Shingrix) 10/25/2020, 01/30/2021    Health Maintenance  Topic Date Due   COVID-19 Vaccine (5 - Pfizer series) 12/04/2021 (Originally 11/07/2020)   MAMMOGRAM  11/19/2022 (Originally 08/15/2021)   DEXA SCAN  11/19/2022 (Originally 05/24/2021)   Hepatitis C Screening  11/19/2022  (Originally 06/24/1963)   INFLUENZA VACCINE  12/08/2021   TETANUS/TDAP  08/24/2026   Pneumonia Vaccine 66+ Years old  Completed   Zoster Vaccines- Shingrix  Completed   HPV VACCINES  Aged Out   COLONOSCOPY (Pts 45-67yrs Insurance coverage will need to be confirmed)  Discontinued       Assessment  This is a routine wellness examination for Amanda Schmitt.  Health Maintenance: Due or Overdue There are no preventive care reminders to display for this patient.   Amanda Schmitt does not need a referral for Community Assistance: Care Management:   no Social Work:    no Prescription Assistance:  no Nutrition/Diabetes Education:  no   Plan:  Personalized Goals  Goals Addressed             This Visit's Progress    Patient Stated       11/18/2021 AWV Goal: Exercise for General Health  Patient will verbalize understanding of the benefits of increased physical activity: Exercising regularly is important. It will improve your overall fitness, flexibility, and endurance. Regular exercise also will improve your overall health. It can help you control your weight, reduce stress, and improve your bone density. Over the next year, patient will increase physical activity as tolerated with a goal of at least 150 minutes of moderate physical activity per week.  You can tell that you are exercising at a moderate intensity if your heart starts beating faster and you start breathing faster but can still hold a conversation. Moderate-intensity exercise ideas include: Walking 1 mile (1.6 km) in about 15 minutes Biking Hiking Golfing Dancing Water aerobics Patient will verbalize understanding of everyday activities that increase physical activity by providing examples like the following: Yard work, such as: Sales promotion account executive Gardening Washing windows or floors Patient will be able to explain general safety  guidelines for exercising:  Before you start a new exercise program, talk with your health care provider. Do not exercise so much that you hurt yourself, feel dizzy, or get very short of breath. Wear comfortable clothes and wear shoes with good support. Drink plenty of water while you exercise to prevent dehydration or heat stroke. Work out until your breathing and your heartbeat get faster.        Personalized Health Maintenance & Screening Recommendations  Screening mammography Bone densitometry screening  She did have the Mammogram done on 08/21/21 at [facility].  Lung Cancer Screening Recommended: no (Low Dose CT Chest recommended if Age 97-80 years, 30 pack-year currently smoking OR have quit w/in past 15 years) Hepatitis C Screening recommended: yes HIV Screening recommended: no  Advanced Directives: Written information was not prepared per patient's request.  Referrals & Orders Orders Placed This Encounter  Procedures   [place]    Follow-up Plan Follow-up with Luetta Nutting, DO as planned Bone density referral has been sent.  Medicare wellness visit in one year.  Patient will access AVS on my chart.   I have personally reviewed and noted the following in the patient's chart:   Medical and social history Use of alcohol, tobacco or illicit drugs  Current medications and supplements Functional ability and status Nutritional status Physical activity Advanced directives List of other physicians Hospitalizations, surgeries, and ER visits in previous 12 months Vitals Screenings to include cognitive, depression,  and falls Referrals and appointments  In addition, I have reviewed and discussed with Amanda Schmitt certain preventive protocols, quality metrics, and best practice recommendations. A written personalized care plan for preventive services as well as general preventive health recommendations is available and can be mailed to the patient at her  request.      Modesto Charon, RN  BSN  11/18/2021

## 2021-11-18 NOTE — Patient Instructions (Addendum)
MEDICARE ANNUAL WELLNESS VISIT Health Maintenance Summary and Written Plan of Care  [name] ,  Thank you for allowing me to perform your Medicare Annual Wellness Visit and for your ongoing commitment to your health.   Health Maintenance & Immunization History Health Maintenance  Topic Date Due   COVID-19 Vaccine (5 - Pfizer series) 12/04/2021 (Originally 11/07/2020)   MAMMOGRAM  11/19/2022 (Originally 08/15/2021)   DEXA SCAN  11/19/2022 (Originally 05/24/2021)   Hepatitis C Screening  11/19/2022 (Originally 06/24/1963)   INFLUENZA VACCINE  12/08/2021   TETANUS/TDAP  08/24/2026   Pneumonia Vaccine 64+ Years old  Completed   Zoster Vaccines- Shingrix  Completed   HPV VACCINES  Aged Out   COLONOSCOPY (Pts 45-85yrs Insurance coverage will need to be confirmed)  Discontinued   Immunization History  Administered Date(s) Administered   Fluad Quad(high Dose 65+) 02/05/2020   Influenza, High Dose Seasonal PF 02/13/2015, 03/18/2016, 02/02/2017, 02/10/2018, 01/15/2019   Influenza-Unspecified 02/23/2021   PFIZER(Purple Top)SARS-COV-2 Vaccination 06/07/2019, 07/01/2019, 02/20/2020, 09/12/2020   Pneumococcal Conjugate-13 02/13/2015   Pneumococcal Polysaccharide-23 12/31/2011   Td 06/25/2002   Tdap 08/23/2016   Zoster Recombinat (Shingrix) 10/25/2020, 01/30/2021    These are the patient goals that we discussed:  Goals Addressed             This Visit's Progress    Patient Stated       11/18/2021 AWV Goal: Exercise for General Health  Patient will verbalize understanding of the benefits of increased physical activity: Exercising regularly is important. It will improve your overall fitness, flexibility, and endurance. Regular exercise also will improve your overall health. It can help you control your weight, reduce stress, and improve your bone density. Over the next year, patient will increase physical activity as tolerated with a goal of at least 150 minutes of moderate physical  activity per week.  You can tell that you are exercising at a moderate intensity if your heart starts beating faster and you start breathing faster but can still hold a conversation. Moderate-intensity exercise ideas include: Walking 1 mile (1.6 km) in about 15 minutes Biking Hiking Golfing Dancing Water aerobics Patient will verbalize understanding of everyday activities that increase physical activity by providing examples like the following: Yard work, such as: Insurance underwriter Gardening Washing windows or floors Patient will be able to explain general safety guidelines for exercising:  Before you start a new exercise program, talk with your health care provider. Do not exercise so much that you hurt yourself, feel dizzy, or get very short of breath. Wear comfortable clothes and wear shoes with good support. Drink plenty of water while you exercise to prevent dehydration or heat stroke. Work out until your breathing and your heartbeat get faster.          This is a list of Health Maintenance Items that are overdue or due now: Screening mammography Bone densitometry screening  She did have the Mammogram done on 08/21/21 at [facility].  Orders/Referrals Placed Today: Orders Placed This Encounter  Procedures   DEXAScan    Scheduling Instructions:     Patient will call to schedule the appointment.    Order Specific Question:   Reason for exam:    Answer:   Post menopausal    Order Specific Question:   Preferred imaging location?    Answer:   External   (Contact our referral department at [phone] if you have  not spoken with someone about your referral appointment within the next 5 days)    Follow-up Plan Follow-up with Everrett Coombe, DO as planned Bone density referral has been sent.  Medicare wellness visit in one year.  Patient will access AVS on my chart.       Health Maintenance, Female Adopting a healthy lifestyle and getting preventive care are important in promoting health and wellness. Ask your health care provider about: The right schedule for you to have regular tests and exams. Things you can do on your own to prevent diseases and keep yourself healthy. What should I know about diet, weight, and exercise? Eat a healthy diet  Eat a diet that includes plenty of vegetables, fruits, low-fat dairy products, and lean protein. Do not eat a lot of foods that are high in solid fats, added sugars, or sodium. Maintain a healthy weight Body mass index (BMI) is used to identify weight problems. It estimates body fat based on height and weight. Your health care provider can help determine your BMI and help you achieve or maintain a healthy weight. Get regular exercise Get regular exercise. This is one of the most important things you can do for your health. Most adults should: Exercise for at least 150 minutes each week. The exercise should increase your heart rate and make you sweat (moderate-intensity exercise). Do strengthening exercises at least twice a week. This is in addition to the moderate-intensity exercise. Spend less time sitting. Even light physical activity can be beneficial. Watch cholesterol and blood lipids Have your blood tested for lipids and cholesterol at 76 years of age, then have this test every 5 years. Have your cholesterol levels checked more often if: Your lipid or cholesterol levels are high. You are older than 76 years of age. You are at high risk for heart disease. What should I know about cancer screening? Depending on your health history and family history, you may need to have cancer screening at various ages. This may include screening for: Breast cancer. Cervical cancer. Colorectal cancer. Skin cancer. Lung cancer. What should I know about heart disease, diabetes, and high blood pressure? Blood pressure and heart  disease High blood pressure causes heart disease and increases the risk of stroke. This is more likely to develop in people who have high blood pressure readings or are overweight. Have your blood pressure checked: Every 3-5 years if you are 35-41 years of age. Every year if you are 63 years old or older. Diabetes Have regular diabetes screenings. This checks your fasting blood sugar level. Have the screening done: Once every three years after age 30 if you are at a normal weight and have a low risk for diabetes. More often and at a younger age if you are overweight or have a high risk for diabetes. What should I know about preventing infection? Hepatitis B If you have a higher risk for hepatitis B, you should be screened for this virus. Talk with your health care provider to find out if you are at risk for hepatitis B infection. Hepatitis C Testing is recommended for: Everyone born from [birth year] through 1965. Anyone with known risk factors for hepatitis C. Sexually transmitted infections (STIs) Get screened for STIs, including gonorrhea and chlamydia, if: You are sexually active and are younger than 76 years of age. You are older than 76 years of age and your health care provider tells you that you are at risk for this type of infection. Your sexual activity has changed  since you were last screened, and you are at increased risk for chlamydia or gonorrhea. Ask your health care provider if you are at risk. Ask your health care provider about whether you are at high risk for HIV. Your health care provider may recommend a prescription medicine to help prevent HIV infection. If you choose to take medicine to prevent HIV, you should first get tested for HIV. You should then be tested every 3 months for as long as you are taking the medicine. Pregnancy If you are about to stop having your period (premenopausal) and you may become pregnant, seek counseling before you get pregnant. Take 400 to 800  micrograms (mcg) of folic acid every day if you become pregnant. Ask for birth control (contraception) if you want to prevent pregnancy. Osteoporosis and menopause Osteoporosis is a disease in which the bones lose minerals and strength with aging. This can result in bone fractures. If you are 39 years old or older, or if you are at risk for osteoporosis and fractures, ask your health care provider if you should: Be screened for bone loss. Take a calcium or vitamin D supplement to lower your risk of fractures. Be given hormone replacement therapy (HRT) to treat symptoms of menopause. Follow these instructions at home: Alcohol use Do not drink alcohol if: Your health care provider tells you not to drink. You are pregnant, may be pregnant, or are planning to become pregnant. If you drink alcohol: Limit how much you have to: 0-1 drink a day. Know how much alcohol is in your drink. In the [place] one drink equals one 12 oz bottle of beer (355 mL), one 5 oz glass of wine (148 mL), or one 1 oz glass of hard liquor (44 mL). Lifestyle Do not use any products that contain nicotine or tobacco. These products include cigarettes, chewing tobacco, and vaping devices, such as e-cigarettes. If you need help quitting, ask your health care provider. Do not use street drugs. Do not share needles. Ask your health care provider for help if you need support or information about quitting drugs. General instructions Schedule regular health, dental, and eye exams. Stay current with your vaccines. Tell your health care provider if: You often feel depressed. You have ever been abused or do not feel safe at home. Summary Adopting a healthy lifestyle and getting preventive care are important in promoting health and wellness. Follow your health care provider's instructions about healthy diet, exercising, and getting tested or screened for diseases. Follow your health care provider's instructions on monitoring your  cholesterol and blood pressure. This information is not intended to replace advice given to you by your health care provider. Make sure you discuss any questions you have with your health care provider. Document Revised: 09/15/2020 Document Reviewed: 09/15/2020 Elsevier Patient Education  2023 [employer].

## 2021-11-20 ENCOUNTER — Ambulatory Visit: Payer: Medicare PPO | Admitting: Sports Medicine

## 2021-12-03 DIAGNOSIS — Z780 Asymptomatic menopausal state: Secondary | ICD-10-CM | POA: Diagnosis not present

## 2021-12-03 LAB — HM DEXA SCAN

## 2021-12-09 ENCOUNTER — Ambulatory Visit: Payer: Medicare PPO

## 2021-12-11 ENCOUNTER — Ambulatory Visit (INDEPENDENT_AMBULATORY_CARE_PROVIDER_SITE_OTHER): Payer: Medicare PPO | Admitting: Family Medicine

## 2021-12-11 VITALS — BP 129/71 | HR 73

## 2021-12-11 DIAGNOSIS — E538 Deficiency of other specified B group vitamins: Secondary | ICD-10-CM | POA: Diagnosis not present

## 2021-12-11 MED ORDER — CYANOCOBALAMIN 1000 MCG/ML IJ SOLN
1000.0000 ug | Freq: Once | INTRAMUSCULAR | Status: AC
  Administered 2021-12-11: 1000 ug via INTRAMUSCULAR

## 2021-12-11 NOTE — Progress Notes (Signed)
   Established Patient Office Visit  Subjective   Patient ID: Amanda Schmitt, female    DOB: [birth year]  Age: 76 y.o. MRN: [id number]  Chief Complaint  Patient presents with   Injections    HPI  Amanda Schmitt is here for a vitamin B 12 injection. Denies muscle cramps, weakness or irregular heart rate.   ROS    Objective:     BP 129/71   Pulse 73   SpO2 98%    Physical Exam   No results found for any visits on 12/11/21.    The ASCVD Risk score (Arnett DK, et al., 2019) failed to calculate for the following reasons:   The valid HDL cholesterol range is 20 to 100 mg/dL    Assessment & Plan:  B12 deficiency - Patient tolerated injection well without complications. Patient advised to schedule next injection 30 days from today. Also advised she will need a B12 lab before next injection.   Problem List Items Addressed This Visit   None Visit Diagnoses     B12 deficiency    -  Primary   Relevant Medications   cyanocobalamin (VITAMIN B12) injection 1,000 mcg (Completed) (Start on 12/11/2021  2:30 PM)       Return in about 1 month (around 01/11/2022) for B12 injection. Earna Coder, Janalyn Harder, CMA   Medical screening examination/treatment was performed by qualified clinical staff member and as supervising physician I was immediately available for consultation/collaboration. I have reviewed documentation and agree with assessment and plan.  Colbert Coyer. Tamera Punt, DO

## 2021-12-17 ENCOUNTER — Encounter: Payer: Self-pay | Admitting: Sports Medicine

## 2021-12-17 ENCOUNTER — Other Ambulatory Visit: Payer: Self-pay

## 2021-12-17 ENCOUNTER — Ambulatory Visit: Payer: Medicare PPO | Admitting: Sports Medicine

## 2021-12-17 DIAGNOSIS — M5416 Radiculopathy, lumbar region: Secondary | ICD-10-CM | POA: Diagnosis not present

## 2021-12-17 DIAGNOSIS — E538 Deficiency of other specified B group vitamins: Secondary | ICD-10-CM

## 2021-12-17 MED ORDER — PREGABALIN 75 MG PO CAPS: ORAL_CAPSULE | ORAL | 3 refills | Status: DC

## 2021-12-17 NOTE — Assessment & Plan Note (Signed)
Satia returns, she is a pleasant 76 year old female, known L4-S1 lumbar DDD, the right L5 nerve root comes close to the disc, but it is not obviously compressed. She also has facet arthritis L4-S1 bilaterally. She is complaining of axial pain with radiation down the back of the right leg to the bottom of the right foot, no red flag symptoms. We tried some prednisone, Flexeril, it did not help, so we are going to proceed with a right L5-S1 interlaminar epidural. We could certainly try facet injections in the future if her axial pain does not resolve. She is also looking for some relief now so we will add Lyrica with an up taper (she already tried and did not tolerate gabapentin). Considering the lack of obvious nerve root compression on the MRI if she continues to have radicular symptoms we would need a nerve conduction/EMG. Return to see me 6 weeks after the epidural.

## 2021-12-17 NOTE — Progress Notes (Signed)
    Procedures performed today:    None.  Independent interpretation of notes and tests performed by another provider:   None.  Brief History, Exam, Impression, and Recommendations:    Right lumbar radiculitis Amanda Schmitt returns, she is a pleasant 76 year old female, known L4-S1 lumbar DDD, the right L5 nerve root comes close to the disc, but it is not obviously compressed. She also has facet arthritis L4-S1 bilaterally. She is complaining of axial pain with radiation down the back of the right leg to the bottom of the right foot, no red flag symptoms. We tried some prednisone, Flexeril, it did not help, so we are going to proceed with a right L5-S1 interlaminar epidural. We could certainly try facet injections in the future if her axial pain does not resolve. She is also looking for some relief now so we will add Lyrica with an up taper (she already tried and did not tolerate gabapentin). Considering the lack of obvious nerve root compression on the MRI if she continues to have radicular symptoms we would need a nerve conduction/EMG. Return to see me 6 weeks after the epidural.   ____________________________________________ Ihor Austin. [name], M.D., ABFM., CAQSM., AME. Primary Care and Sports Medicine [facility]  Adjunct Professor of Family Medicine  [place] [employer] [facility] [facility] [employer] [employer]  Restaurant manager, fast food

## 2021-12-17 NOTE — Progress Notes (Signed)
Ordered B12

## 2021-12-23 ENCOUNTER — Ambulatory Visit
Admission: RE | Admit: 2021-12-23 | Discharge: 2021-12-23 | Disposition: A | Discharge status: Home / Self Care | Payer: Medicare PPO | Source: Ambulatory Visit | Attending: Sports Medicine | Admitting: Sports Medicine

## 2021-12-23 DIAGNOSIS — M5416 Radiculopathy, lumbar region: Secondary | ICD-10-CM

## 2021-12-23 DIAGNOSIS — M47817 Spondylosis without myelopathy or radiculopathy, lumbosacral region: Secondary | ICD-10-CM | POA: Diagnosis not present

## 2021-12-23 MED ORDER — IOPAMIDOL (ISOVUE-M 200) INJECTION 41%
1.0000 mL | Freq: Once | INTRAMUSCULAR | Status: AC
  Administered 2021-12-23: 1 mL via EPIDURAL

## 2021-12-23 MED ORDER — METHYLPREDNISOLONE ACETATE 40 MG/ML INJ SUSP (RADIOLOG
80.0000 mg | Freq: Once | INTRAMUSCULAR | Status: AC
  Administered 2021-12-23: 80 mg via EPIDURAL

## 2021-12-23 NOTE — Discharge Instructions (Signed)

## 2021-12-24 ENCOUNTER — Encounter: Payer: Self-pay | Admitting: Family Medicine

## 2022-01-08 ENCOUNTER — Ambulatory Visit: Payer: Medicare PPO | Admitting: Family Medicine

## 2022-01-08 ENCOUNTER — Encounter: Payer: Self-pay | Admitting: Family Medicine

## 2022-01-08 DIAGNOSIS — R030 Elevated blood-pressure reading, without diagnosis of hypertension: Secondary | ICD-10-CM | POA: Diagnosis not present

## 2022-01-08 DIAGNOSIS — E538 Deficiency of other specified B group vitamins: Secondary | ICD-10-CM

## 2022-01-09 LAB — VITAMIN B12: Vitamin B-12: 329 pg/mL (ref 200–1100)

## 2022-01-11 DIAGNOSIS — R030 Elevated blood-pressure reading, without diagnosis of hypertension: Secondary | ICD-10-CM | POA: Insufficient documentation

## 2022-01-11 DIAGNOSIS — E538 Deficiency of other specified B group vitamins: Secondary | ICD-10-CM | POA: Insufficient documentation

## 2022-01-11 NOTE — Assessment & Plan Note (Signed)
Mild elevation of her blood pressure at home.  Readings in clinic today look okay.  Instructed to continue monitoring at home occasionally.  Would not add medication at this time.

## 2022-01-11 NOTE — Progress Notes (Signed)
Amanda Schmitt - 76 y.o. female MRN [id number]  Date of birth: 1945/08/13  Subjective Chief Complaint  Patient presents with   B12 Injection    HPI Amanda Schmitt is a 76 year old female here today for follow-up B12 deficiency.  She is on oral B12 injections.  She would like to have updated lab work completed.  Additionally she is concerned about her blood pressures.  Readings at home have been between 130 and 140/75-85.  She has not had any symptoms related to hypertension.  Denies chest pain, shortness of breath, palpitations, headaches or vision changes.  She denies any changes to her diet or activity level.  ROS:  A comprehensive ROS was completed and negative except as noted per HPI  Allergies  Allergen Reactions   Moxifloxacin Other (See Comments)    Flu like symptoms   Codeine Nausea Only   Nitrofuran Derivatives    Tramadol Nausea Only    Past Medical History:  Diagnosis Date   Depression     Past Surgical History:  Procedure Laterality Date   ABDOMINAL HYSTERECTOMY     ABDOMINAL HYSTERECTOMY     Bowel blockage     BOWEL RESECTION     CESAREAN SECTION      Social History   Socioeconomic History   Marital status: Widowed    Spouse name: Not on file   Number of children: 2   Years of education: 16   Highest education level: Bachelor's degree (e.g., BA, AB, BS)  Occupational History   Occupation: Retired    Comment: Runner, broadcasting/film/video  Tobacco Use   Smoking status: Former    Packs/day: 0.50    Years: 26.00    Total pack years: 13.00    Types: Cigarettes    Quit date: 1999    Years since quitting: 24.6   Smokeless tobacco: Never  Substance and Sexual Activity   Alcohol use: Yes    Alcohol/week: 2.0 standard drinks of alcohol    Types: 2 Glasses of wine per week    Comment: per night   Drug use: No   Sexual activity: Not Currently  Other Topics Concern   Not on file  Social History Narrative   Lives alone. Volunteers at [employer]. Enjoys doing crossword  puzzles, reading and painting furniture.   Social Determinants of Health   Financial Resource Strain: Low Risk  (05/23/2020)   Overall Financial Resource Strain (CARDIA)    Difficulty of Paying Living Expenses: Not hard at all  Food Insecurity: No Food Insecurity (11/18/2021)   Hunger Vital Sign    Worried About Running Out of Food in the Last Year: Never true    Ran Out of Food in the Last Year: Never true  Transportation Needs: No Transportation Needs (11/18/2021)   PRAPARE - Administrator, Civil Service (Medical): No    Lack of Transportation (Non-Medical): No  Physical Activity: Inactive (11/18/2021)   Exercise Vital Sign    Days of Exercise per Week: 0 days    Minutes of Exercise per Session: 0 min  Stress: No Stress Concern Present (11/18/2021)   [employer] of Occupational Health - Occupational Stress Questionnaire    Feeling of Stress : Not at all  Social Connections: Socially Isolated (11/18/2021)   Social Connection and Isolation Panel [NHANES]    Frequency of Communication with Friends and Family: More than three times a week    Frequency of Social Gatherings with Friends and Family: Three times a week    Attends  Religious Services: Never    Active Member of Clubs or Organizations: No    Attends Banker Meetings: Never    Marital Status: Widowed    Family History  Problem Relation Age of Onset   Cancer Father        unorgin    Health Maintenance  Topic Date Due   COVID-19 Vaccine (5 - Pfizer series) 06/10/2022 (Originally 11/07/2020)   INFLUENZA VACCINE  08/08/2022 (Originally 12/08/2021)   MAMMOGRAM  11/19/2022 (Originally 08/22/2022)   DEXA SCAN  11/19/2022 (Originally 05/24/2021)   Hepatitis C Screening  11/19/2022 (Originally 06/24/1963)   TETANUS/TDAP  08/24/2026   Pneumonia Vaccine 40+ Years old  Completed   Zoster Vaccines- Shingrix  Completed   HPV VACCINES  Aged Out   COLONOSCOPY (Pts 45-39yrs Insurance coverage will need to be  confirmed)  Discontinued     ----------------------------------------------------------------------------------------------------------------------------------------------------------------------------------------------------------------- Physical Exam BP 138/73 (BP Location: Left Arm, Patient Position: Sitting, Cuff Size: Normal)   Pulse 62   Ht 5' (1.524 m)   Wt 126 lb 1.9 oz (57.2 kg)   SpO2 98%   BMI 24.63 kg/m   Physical Exam Constitutional:      Appearance: Normal appearance.  Eyes:     General: No scleral icterus. Cardiovascular:     Rate and Rhythm: Normal rate and regular rhythm.  Pulmonary:     Effort: Pulmonary effort is normal.     Breath sounds: Normal breath sounds.  Neurological:     Mental Status: She is alert.  Psychiatric:        Mood and Affect: Mood normal.        Behavior: Behavior normal.     ------------------------------------------------------------------------------------------------------------------------------------------------------------------------------------------------------------------- Assessment and Plan  Elevated blood-pressure reading without diagnosis of hypertension Mild elevation of her blood pressure at home.  Readings in clinic today look okay.  Instructed to continue monitoring at home occasionally.  Would not add medication at this time.  B12 deficiency Update B12 levels.   No orders of the defined types were placed in this encounter.   No follow-ups on file.    This visit occurred during the SARS-CoV-2 public health emergency.  Safety protocols were in place, including screening questions prior to the visit, additional usage of staff PPE, and extensive cleaning of exam room while observing appropriate contact time as indicated for disinfecting solutions.

## 2022-01-11 NOTE — Assessment & Plan Note (Signed)
Update B12 levels.  

## 2022-01-12 ENCOUNTER — Ambulatory Visit (INDEPENDENT_AMBULATORY_CARE_PROVIDER_SITE_OTHER): Payer: Medicare PPO | Admitting: Family Medicine

## 2022-01-12 VITALS — BP 134/64 | HR 62

## 2022-01-12 DIAGNOSIS — E538 Deficiency of other specified B group vitamins: Secondary | ICD-10-CM | POA: Diagnosis not present

## 2022-01-12 MED ORDER — CYANOCOBALAMIN 1000 MCG/ML IJ SOLN
1000.0000 ug | Freq: Once | INTRAMUSCULAR | Status: AC
  Administered 2022-01-12: 1000 ug via INTRAMUSCULAR

## 2022-01-12 NOTE — Progress Notes (Signed)
   Established Patient Office Visit  Subjective   Patient ID: Amanda Schmitt, female    DOB: 1946/05/09  Age: 76 y.o. MRN: [id number]  Chief Complaint  Patient presents with   Pernicious Anemia    HPI  Amanda Schmitt is here for a vitamin B 12 injection. Denies muscle cramps, weakness or irregular heart rate.   ROS    Objective:     BP 134/64   Pulse 62   SpO2 98%    Physical Exam   No results found for any visits on 01/12/22.    The ASCVD Risk score (Arnett DK, et al., 2019) failed to calculate for the following reasons:   The valid HDL cholesterol range is 20 to 100 mg/dL    Assessment & Plan:  B12 deficiency - Patient tolerated injection well without complications. Patient advised to schedule next injection 30 days from today.    Problem List Items Addressed This Visit       Unprioritized   B12 deficiency - Primary    Return in about 1 month (around 02/11/2022) for B12 injection. Earna Coder, Janalyn Harder, CMA

## 2022-01-12 NOTE — Progress Notes (Signed)
Medical screening examination/treatment was performed by qualified clinical staff member and as supervising physician I was immediately available for consultation/collaboration. I have reviewed documentation and agree with assessment and plan.  Dillie Burandt, DO  

## 2022-01-14 ENCOUNTER — Telehealth: Payer: Self-pay

## 2022-01-14 DIAGNOSIS — M5416 Radiculopathy, lumbar region: Secondary | ICD-10-CM

## 2022-01-14 NOTE — Telephone Encounter (Signed)
Patient stated that she had an epidural a few weeks ago and would like to have another.

## 2022-01-15 NOTE — Telephone Encounter (Signed)
Done

## 2022-01-19 ENCOUNTER — Ambulatory Visit
Admission: RE | Admit: 2022-01-19 | Discharge: 2022-01-19 | Disposition: A | Discharge status: Home / Self Care | Payer: Medicare PPO | Source: Ambulatory Visit | Attending: Sports Medicine | Admitting: Sports Medicine

## 2022-01-19 ENCOUNTER — Other Ambulatory Visit: Payer: Self-pay | Admitting: Family Medicine

## 2022-01-19 DIAGNOSIS — M5416 Radiculopathy, lumbar region: Secondary | ICD-10-CM

## 2022-01-19 DIAGNOSIS — M47817 Spondylosis without myelopathy or radiculopathy, lumbosacral region: Secondary | ICD-10-CM | POA: Diagnosis not present

## 2022-01-19 MED ORDER — IOPAMIDOL (ISOVUE-M 200) INJECTION 41%
1.0000 mL | Freq: Once | INTRAMUSCULAR | Status: AC
  Administered 2022-01-19: 1 mL via EPIDURAL

## 2022-01-19 MED ORDER — METHYLPREDNISOLONE ACETATE 40 MG/ML INJ SUSP (RADIOLOG
80.0000 mg | Freq: Once | INTRAMUSCULAR | Status: AC
  Administered 2022-01-19: 80 mg via EPIDURAL

## 2022-01-19 NOTE — Discharge Instructions (Signed)

## 2022-01-20 ENCOUNTER — Other Ambulatory Visit: Payer: Medicare PPO

## 2022-02-02 ENCOUNTER — Ambulatory Visit: Payer: Medicare PPO | Admitting: Sports Medicine

## 2022-02-02 ENCOUNTER — Ambulatory Visit (INDEPENDENT_AMBULATORY_CARE_PROVIDER_SITE_OTHER): Payer: Medicare PPO

## 2022-02-02 DIAGNOSIS — M5416 Radiculopathy, lumbar region: Secondary | ICD-10-CM

## 2022-02-02 DIAGNOSIS — M7061 Trochanteric bursitis, right hip: Secondary | ICD-10-CM

## 2022-02-02 DIAGNOSIS — M7062 Trochanteric bursitis, left hip: Secondary | ICD-10-CM

## 2022-02-02 NOTE — Assessment & Plan Note (Signed)
Amanda Schmitt also has known L4-S1 lumbar DDD, she has had 2 lumbar epidurals and does not have any more axial back pain.

## 2022-02-02 NOTE — Assessment & Plan Note (Signed)
Pleasant 76 year old female, known bilateral trochanteric bursitis, she has bilateral lateral hip pain today, last ejection was on the right in March 2023, today we did bilateral trochanteric bursa injections. She also has bilateral hip abductor weakness, so she will work aggressively on hip abductor strengthening. We went over the exercises today, I should not be able to push her leg down on either side at the follow-up visit.

## 2022-02-02 NOTE — Progress Notes (Signed)
    Procedures performed today:    Procedure: Real-time Ultrasound Guided injection of the left greater trochanteric bursa Device: Samsung HS60  Verbal informed consent obtained.  Time-out conducted.  Noted no overlying erythema, induration, or other signs of local infection.  Skin prepped in a sterile fashion.  Local anesthesia: Topical Ethyl chloride.  With sterile technique and under real time ultrasound guidance: Noted normal-appearing hip abductor's, 1 cc Kenalog 40, 2 cc lidocaine, 2 cc bupivacaine injected easily Completed without difficulty  Advised to call if fevers/chills, erythema, induration, drainage, or persistent bleeding.  Images permanently stored and available for review in PACS.  Impression: Technically successful ultrasound guided injection.  Procedure: Real-time Ultrasound Guided injection of the right greater trochanteric bursa Device: Samsung HS60  Verbal informed consent obtained.  Time-out conducted.  Noted no overlying erythema, induration, or other signs of local infection.  Skin prepped in a sterile fashion.  Local anesthesia: Topical Ethyl chloride.  With sterile technique and under real time ultrasound guidance: Noted normal-appearing hip abductor's, 1 cc Kenalog 40, 2 cc lidocaine, 2 cc bupivacaine injected easily Completed without difficulty  Advised to call if fevers/chills, erythema, induration, drainage, or persistent bleeding.  Images permanently stored and available for review in PACS.  Impression: Technically successful ultrasound guided injection.  Independent interpretation of notes and tests performed by another provider:   None.  Brief History, Exam, Impression, and Recommendations:    Trochanteric bursitis of both hips Pleasant 76 year old female, known bilateral trochanteric bursitis, she has bilateral lateral hip pain today, last ejection was on the right in March 2023, today we did bilateral trochanteric bursa injections. She also  has bilateral hip abductor weakness, so she will work aggressively on hip abductor strengthening. We went over the exercises today, I should not be able to push her leg down on either side at the follow-up visit.  Right lumbar radiculitis [name] also has known L4-S1 lumbar DDD, she has had 2 lumbar epidurals and does not have any more axial back pain.    ____________________________________________ Gwen Her. Dianah Field, M.D., ABFM., CAQSM., AME. Primary Care and Sports Medicine [facility]  Adjunct Professor of [place] [employer] [facility] of Medicine  Risk manager

## 2022-02-11 ENCOUNTER — Ambulatory Visit (INDEPENDENT_AMBULATORY_CARE_PROVIDER_SITE_OTHER): Payer: Medicare PPO | Admitting: Medical-Surgical

## 2022-02-11 VITALS — BP 139/72 | HR 72 | Ht 60.0 in | Wt 126.0 lb

## 2022-02-11 DIAGNOSIS — E538 Deficiency of other specified B group vitamins: Secondary | ICD-10-CM

## 2022-02-11 MED ORDER — CYANOCOBALAMIN 1000 MCG/ML IJ SOLN
1000.0000 ug | Freq: Once | INTRAMUSCULAR | Status: AC
  Administered 2022-02-11: 1000 ug via INTRAMUSCULAR

## 2022-02-11 NOTE — Progress Notes (Signed)
Agree with documentation as below.  ___________________________________________ Kanita Delage L. Braven Wolk, DNP, APRN, FNP-BC Primary Care and Sports Medicine [facility]  

## 2022-02-11 NOTE — Progress Notes (Signed)
Pt received B12 injection. Denies muscle cramps, weakness or irregular heart rate.

## 2022-02-17 ENCOUNTER — Other Ambulatory Visit: Payer: Self-pay | Admitting: Family Medicine

## 2022-02-24 ENCOUNTER — Other Ambulatory Visit: Payer: Self-pay

## 2022-02-24 NOTE — Telephone Encounter (Signed)
Pt called and left vm - states was told to call before this refill of Amanda Schmitt - that Dr. Zigmund Daniel was going to increase it to 10mg   ' Last appt was  02/02/22 - only upcoming appt is  03/24/22 with Dr. Darene Lamer and [facility] after that in July . Pt contact # [phone]

## 2022-02-25 ENCOUNTER — Telehealth: Payer: Self-pay

## 2022-02-25 MED ORDER — ZOLPIDEM TARTRATE ER 12.5 MG PO TBCR: 12.5000 mg | EXTENDED_RELEASE_TABLET | Freq: Every day | ORAL | 1 refills | Status: DC

## 2022-02-25 NOTE — Telephone Encounter (Signed)
Patient called. States she was on the Ambien as 6.25 - she was thinking the increase would be to 10mg  but pharmacy received 12.5mg  - she wanted to be sure that this is the correct increase  and this is what Dr. Zigmund Daniel wanted her to be one before she goes to pick up the prescription

## 2022-02-26 NOTE — Telephone Encounter (Signed)
Attempted call to patient - left voice mail to have patient return our call .

## 2022-03-01 ENCOUNTER — Encounter: Payer: Self-pay | Admitting: Physician Assistant

## 2022-03-01 ENCOUNTER — Ambulatory Visit (INDEPENDENT_AMBULATORY_CARE_PROVIDER_SITE_OTHER): Payer: Medicare PPO

## 2022-03-01 ENCOUNTER — Ambulatory Visit: Payer: Medicare PPO | Admitting: Physician Assistant

## 2022-03-01 VITALS — BP 155/70 | HR 73 | Temp 97.7°F | Ht 60.0 in | Wt 126.0 lb

## 2022-03-01 DIAGNOSIS — R221 Localized swelling, mass and lump, neck: Secondary | ICD-10-CM

## 2022-03-01 DIAGNOSIS — R220 Localized swelling, mass and lump, head: Secondary | ICD-10-CM

## 2022-03-01 LAB — POCT UA - MICROALBUMIN
Albumin/Creatinine Ratio, Urine, POC: <30
Creatinine, POC: 200 mg/dL
Microalbumin Ur, POC: 30 mg/L

## 2022-03-01 NOTE — Progress Notes (Signed)
Acute Office Visit  Subjective:     Patient ID: Amanda Schmitt, female    DOB: February 24, 1946, 76 y.o.   MRN: [id number]  Chief Complaint  Patient presents with   Edema    HPI Patient is in today for bilateral neck swelling and face swelling for the last 4 days. The face swelling was the first noticed and has resolved. The neck swelling persisted. No fever, chills, nausea, vomiting, body aches. Neck is not tender. She is not having any problems swallowing. She denies any new medications or supplements. No new symptoms. She did have flu and covid vaccine 14th of October. She did have some steroid injections from Dr. Karie Schwalbe about 3 weeks ago.    .. Active Ambulatory Problems    Diagnosis Date Noted   Trigeminal neuralgia 09/27/2019   Senile nuclear sclerosis 07/25/2014   Primary insomnia 06/22/2016   Osteopenia of multiple sites 08/11/2015   Mixed hyperlipidemia 08/11/2015   Major depressive disorder in full remission (HCC) 08/11/2015   Palpitations 09/27/2019   Epigastric pain 09/27/2019   GAD (generalized anxiety disorder) 10/16/2019   Trochanteric bursitis of both hips 04/15/2020   Right lumbar radiculitis 06/30/2020   Recurrent UTI 09/23/2020   Well adult exam 07/08/2021   Transient speech disturbance 07/08/2021   Elevated blood-pressure reading without diagnosis of hypertension 01/11/2022   B12 deficiency 01/11/2022   Swelling of face 03/01/2022   Neck swelling 03/01/2022   Resolved Ambulatory Problems    Diagnosis Date Noted   Toe pain, right 09/27/2019   Colon cancer screening 02/05/2020   Past Medical History:  Diagnosis Date   Depression        ROS  See HPI.     Objective:    BP (!) 155/70   Pulse 73   Temp 97.7 F (36.5 C) (Oral)   Ht 5' (1.524 m)   Wt 126 lb (57.2 kg)   SpO2 99%   BMI 24.61 kg/m  BP Readings from Last 3 Encounters:  03/01/22 (!) 155/70  02/11/22 139/72  01/19/22 (!) 180/82   Wt Readings from Last 3 Encounters:  03/01/22 126 lb  (57.2 kg)  02/11/22 126 lb (57.2 kg)  01/08/22 126 lb 1.9 oz (57.2 kg)      Physical Exam Constitutional:      Appearance: Normal appearance.  HENT:     Head: Normocephalic.     Nose: Nose normal.     Mouth/Throat:     Mouth: Mucous membranes are moist.  Eyes:     Extraocular Movements: Extraocular movements intact.     Conjunctiva/sclera: Conjunctivae normal.     Pupils: Pupils are equal, round, and reactive to light.  Neck:     Vascular: No carotid bruit.      Comments: Pockets of swelling bilateral supraclavicular area distal to the sternum and anterior to adams apple. No defined borders and very fluctuant.  Cardiovascular:     Rate and Rhythm: Normal rate.  Pulmonary:     Effort: Pulmonary effort is normal.  Musculoskeletal:     Cervical back: Normal range of motion and neck supple. No tenderness.     Right lower leg: No edema.     Left lower leg: No edema.  Lymphadenopathy:     Cervical: No cervical adenopathy.  Neurological:     General: No focal deficit present.     Mental Status: She is alert and oriented to person, place, and time.  Psychiatric:        Mood  and Affect: Mood normal.    .. Results for orders placed or performed in visit on 03/01/22  POCT UA - Microalbumin  Result Value Ref Range   Microalbumin Ur, POC 30 mg/L   Creatinine, POC 200 mg/dL   Albumin/Creatinine Ratio, Urine, POC <30          Assessment & Plan:  Marland KitchenMarland KitchenKaysen was seen today for edema.  Diagnoses and all orders for this visit:  Neck swelling -     COMPLETE METABOLIC PANEL WITH GFR -     TSH -     CBC w/Diff/Platelet -     US SOFT TISSUE HEAD & NECK (NON-THYROID); Future  Swelling of face -     COMPLETE METABOLIC PANEL WITH GFR -     TSH -     CBC w/Diff/Platelet -     US SOFT TISSUE HEAD & NECK (NON-THYROID); Future   Unclear etiology of facial and neck swelling General face swelling has improved, neck swelling persist.  Will get CBC, TSH, CMP to look for any signs of  infection, kidney/liver decline, thyroid changes. [place] of neck ordered to look at swelling structurally.  UA negative for protein.  ? If could be vaccine reaction from flu and covid Follow up as needed or if symptoms persist or worsen  Iran Planas, PA-C

## 2022-03-01 NOTE — Patient Instructions (Signed)
Go downstairs and schedule neck u/s Get labs today

## 2022-03-02 NOTE — Telephone Encounter (Signed)
Patient informed. Has picked up medication. Last night was her first use of med. States it worked very well for her.

## 2022-03-02 NOTE — Progress Notes (Signed)
Amanda Schmitt,   Thyroid looks great.  WBC normal-reassuring for no infection.  Hemoglobin normal.  Platelets are normal.  Your red blood cells have decreased a little in number and big.  Most common cause is b12 or folate deficiency(JJ please add testing for this).  Kidney and liver look fine.

## 2022-03-02 NOTE — Progress Notes (Signed)
Normal appearing thyroid gland. No abnormality in area of concern.

## 2022-03-04 LAB — CBC WITH DIFFERENTIAL/PLATELET
Absolute Monocytes: 617 cells/uL (ref 200–950)
Basophils Absolute: 32 cells/uL (ref 0–200)
Basophils Relative: 0.5 %
Eosinophils Absolute: 132 cells/uL (ref 15–500)
Eosinophils Relative: 2.1 %
HCT: 38.6 % (ref 35.0–45.0)
Hemoglobin: 13.2 g/dL (ref 11.7–15.5)
Lymphs Abs: 1285 cells/uL (ref 850–3900)
MCH: 35.2 pg — ABNORMAL HIGH (ref 27.0–33.0)
MCHC: 34.2 g/dL (ref 32.0–36.0)
MCV: 102.9 fL — ABNORMAL HIGH (ref 80.0–100.0)
MPV: 10 fL (ref 7.5–12.5)
Monocytes Relative: 9.8 %
Neutro Abs: 4234 cells/uL (ref 1500–7800)
Neutrophils Relative %: 67.2 %
Platelets: 248 10*3/uL (ref 140–400)
RBC: 3.75 10*6/uL — ABNORMAL LOW (ref 3.80–5.10)
RDW: 11.7 % (ref 11.0–15.0)
Total Lymphocyte: 20.4 %
WBC: 6.3 10*3/uL (ref 3.8–10.8)

## 2022-03-04 LAB — B12 AND FOLATE PANEL
Folate: 14 ng/mL
Vitamin B-12: 449 pg/mL (ref 200–1100)

## 2022-03-04 LAB — COMPLETE METABOLIC PANEL WITH GFR
AG Ratio: 1.6 (calc) (ref 1.0–2.5)
ALT: 16 U/L (ref 6–29)
AST: 22 U/L (ref 10–35)
Albumin: 4.2 g/dL (ref 3.6–5.1)
Alkaline phosphatase (APISO): 76 U/L (ref 37–153)
BUN/Creatinine Ratio: 17 (calc) (ref 6–22)
BUN: 10 mg/dL (ref 7–25)
CO2: 28 mmol/L (ref 20–32)
Calcium: 9.3 mg/dL (ref 8.6–10.4)
Chloride: 104 mmol/L (ref 98–110)
Creat: 0.59 mg/dL — ABNORMAL LOW (ref 0.60–1.00)
Globulin: 2.6 g/dL (calc) (ref 1.9–3.7)
Glucose, Bld: 97 mg/dL (ref 65–99)
Potassium: 4.1 mmol/L (ref 3.5–5.3)
Sodium: 141 mmol/L (ref 135–146)
Total Bilirubin: 0.5 mg/dL (ref 0.2–1.2)
Total Protein: 6.8 g/dL (ref 6.1–8.1)
eGFR: 93 mL/min/{1.73_m2} (ref >=60)

## 2022-03-04 LAB — TSH: TSH: 2.19 mIU/L (ref 0.40–4.50)

## 2022-03-05 NOTE — Progress Notes (Signed)
B12 and folate look pretty good.  Do you drink alcohol? If so how much?

## 2022-03-09 DIAGNOSIS — D1801 Hemangioma of skin and subcutaneous tissue: Secondary | ICD-10-CM | POA: Diagnosis not present

## 2022-03-09 DIAGNOSIS — H61012 Acute perichondritis of left external ear: Secondary | ICD-10-CM | POA: Diagnosis not present

## 2022-03-09 DIAGNOSIS — Z129 Encounter for screening for malignant neoplasm, site unspecified: Secondary | ICD-10-CM | POA: Diagnosis not present

## 2022-03-09 DIAGNOSIS — D485 Neoplasm of uncertain behavior of skin: Secondary | ICD-10-CM | POA: Diagnosis not present

## 2022-03-09 DIAGNOSIS — L821 Other seborrheic keratosis: Secondary | ICD-10-CM | POA: Diagnosis not present

## 2022-03-09 DIAGNOSIS — H61011 Acute perichondritis of right external ear: Secondary | ICD-10-CM | POA: Diagnosis not present

## 2022-03-15 ENCOUNTER — Ambulatory Visit: Payer: Medicare PPO

## 2022-03-16 ENCOUNTER — Ambulatory Visit: Payer: Medicare PPO | Admitting: Sports Medicine

## 2022-04-08 ENCOUNTER — Encounter: Payer: Self-pay | Admitting: Family Medicine

## 2022-04-08 ENCOUNTER — Ambulatory Visit: Payer: Medicare PPO | Admitting: Family Medicine

## 2022-04-08 VITALS — BP 154/73 | HR 68 | Ht 60.0 in | Wt 127.0 lb

## 2022-04-08 DIAGNOSIS — M5416 Radiculopathy, lumbar region: Secondary | ICD-10-CM | POA: Diagnosis not present

## 2022-04-08 MED ORDER — PREDNISONE 50 MG PO TABS: ORAL_TABLET | ORAL | 0 refills | Status: DC

## 2022-04-08 MED ORDER — HYDROCODONE-ACETAMINOPHEN 5-325 MG PO TABS: 1.0000 | ORAL_TABLET | Freq: Four times a day (QID) | ORAL | 0 refills | Status: DC | PRN

## 2022-04-08 NOTE — Patient Instructions (Signed)
Start prednisone 50mg  daily x5 days  Use pain medication sparingly.  Keep appt with Dr. .

## 2022-04-08 NOTE — Progress Notes (Signed)
Amanda Schmitt - 76 y.o. female MRN [id number]  Date of birth: Mar 16, 1946  Subjective Chief Complaint  Patient presents with   Hip Pain    HPI Amanda Schmitt is a 76 y.o. female here today with complaint of low back pain.    Pain located in R lower back and and posterior hip/buttock area.  She has had this for a few days.  Was at her daughters house this weekend and was moving some things around.  She does have history of DDD of the lumbar spine as well as bursitis of the hip.  She denies weakness of the leg, numbness or tingling.  She has not tried anything for treatment so far.  She does have an appointment with Dr. [name] on Monday but didn't feel like she could wait that long.    ROS:  A comprehensive ROS was completed and negative except as noted per HPI  Allergies  Allergen Reactions   Moxifloxacin Other (See Comments)    Flu like symptoms   Nitrofuran Derivatives    Codeine Nausea Only   Tramadol Nausea Only    Past Medical History:  Diagnosis Date   Depression     Past Surgical History:  Procedure Laterality Date   ABDOMINAL HYSTERECTOMY     ABDOMINAL HYSTERECTOMY     Bowel blockage     BOWEL RESECTION     CESAREAN SECTION      Social History   Socioeconomic History   Marital status: Widowed    Spouse name: Not on file   Number of children: 2   Years of education: 16   Highest education level: Bachelor's degree (e.g., BA, AB, BS)  Occupational History   Occupation: Retired    Comment: Runner, broadcasting/film/video  Tobacco Use   Smoking status: Former    Packs/day: 0.50    Years: 26.00    Total pack years: 13.00    Types: Cigarettes    Quit date: 1999    Years since quitting: 24.9   Smokeless tobacco: Never  Substance and Sexual Activity   Alcohol use: Yes    Alcohol/week: 2.0 standard drinks of alcohol    Types: 2 Glasses of wine per week    Comment: per night   Drug use: No   Sexual activity: Not Currently  Other Topics Concern   Not on file  Social History  Narrative   Lives alone. Volunteers at [employer]. Enjoys doing crossword puzzles, reading and painting furniture.   Social Determinants of Health   Financial Resource Strain: Low Risk  (05/23/2020)   Overall Financial Resource Strain (CARDIA)    Difficulty of Paying Living Expenses: Not hard at all  Food Insecurity: No Food Insecurity (11/18/2021)   Hunger Vital Sign    Worried About Running Out of Food in the Last Year: Never true    Ran Out of Food in the Last Year: Never true  Transportation Needs: No Transportation Needs (11/18/2021)   PRAPARE - Administrator, Civil Service (Medical): No    Lack of Transportation (Non-Medical): No  Physical Activity: Inactive (11/18/2021)   Exercise Vital Sign    Days of Exercise per Week: 0 days    Minutes of Exercise per Session: 0 min  Stress: No Stress Concern Present (11/18/2021)   [employer] of Occupational Health - Occupational Stress Questionnaire    Feeling of Stress : Not at all  Social Connections: Socially Isolated (11/18/2021)   Social Connection and Isolation Panel [NHANES]    Frequency  of Communication with Friends and Family: More than three times a week    Frequency of Social Gatherings with Friends and Family: Three times a week    Attends Religious Services: Never    Active Member of Clubs or Organizations: No    Attends Banker Meetings: Never    Marital Status: Widowed    Family History  Problem Relation Age of Onset   Cancer Father        unorgin    Health Maintenance  Topic Date Due   COVID-19 Vaccine (5 - 2023-24 season) 06/10/2022 (Originally 01/08/2022)   INFLUENZA VACCINE  08/08/2022 (Originally 12/08/2021)   MAMMOGRAM  11/19/2022 (Originally 08/22/2022)   DEXA SCAN  11/19/2022 (Originally 05/24/2021)   Hepatitis C Screening  11/19/2022 (Originally 06/24/1963)   Medicare Annual Wellness (AWV)  11/19/2022   DTaP/Tdap/Td (3 - Td or Tdap) 08/24/2026   Pneumonia Vaccine 65+ Years  old  Completed   Zoster Vaccines- Shingrix  Completed   HPV VACCINES  Aged Out   COLONOSCOPY (Pts 45-58yrs Insurance coverage will need to be confirmed)  Discontinued     ----------------------------------------------------------------------------------------------------------------------------------------------------------------------------------------------------------------- Physical Exam BP (!) 154/73 (BP Location: Left Arm, Patient Position: Sitting, Cuff Size: Normal)   Pulse 68   Ht 5' (1.524 m)   Wt 127 lb (57.6 kg)   SpO2 99%   BMI 24.80 kg/m   Physical Exam Constitutional:      Appearance: Normal appearance.  HENT:     Head: Normocephalic and atraumatic.  Eyes:     General: No scleral icterus. Musculoskeletal:     Comments: tenderness to palpation along the right lower back and upper buttock area.  She does have some mild tenderness along the right greater trochanter as well.  Neurological:     Mental Status: She is alert.  Psychiatric:        Mood and Affect: Mood normal.        Behavior: Behavior normal.     ------------------------------------------------------------------------------------------------------------------------------------------------------------------------------------------------------------------- Assessment and Plan  Right lumbar radiculitis Adding a prednisone burst as well as Norco for acute pain through the next 2 days.  She does have follow-up with Dr. [name].  She does seem to have some degree of trochanteric bursitis as well and she will discuss injection with him.   Meds ordered this encounter  Medications   HYDROcodone-acetaminophen (NORCO) 5-325 MG tablet    Sig: Take 1 tablet by mouth every 6 (six) hours as needed for severe pain.    Dispense:  10 tablet    Refill:  0   predniSONE (DELTASONE) 50 MG tablet    Sig: Take 50mg  daily x5 days.    Dispense:  5 tablet    Refill:  0    No follow-ups on file.    This visit  occurred during the SARS-CoV-2 public health emergency.  Safety protocols were in place, including screening questions prior to the visit, additional usage of staff PPE, and extensive cleaning of exam room while observing appropriate contact time as indicated for disinfecting solutions.

## 2022-04-08 NOTE — Assessment & Plan Note (Signed)
Adding a prednisone burst as well as Norco for acute pain through the next 2 days.  She does have follow-up with Dr. [name].  She does seem to have some degree of trochanteric bursitis as well and she will discuss injection with him.

## 2022-04-12 ENCOUNTER — Ambulatory Visit: Payer: Medicare PPO | Admitting: Sports Medicine

## 2022-04-12 VITALS — Wt 127.0 lb

## 2022-04-12 DIAGNOSIS — M5416 Radiculopathy, lumbar region: Secondary | ICD-10-CM | POA: Diagnosis not present

## 2022-04-12 NOTE — Assessment & Plan Note (Signed)
This is a pleasant 76 year old female, she has a history of mild lumbar degenerative disc disease, L4-5 and L5-S1, very mild facet arthritis, she has had a few lumbar epidurals with meager improvement. She has also had greater trochanteric bursa injections, today her pain is in the low back, quadratus lumborum with radiation to the anterior hip as well as anterolateral thigh. I am not able to reproduce her pain with rotation of her hip, FADIR or FABER signs, and there is no tenderness over the greater trochanter, no pain with resisted hip abduction, I do think her discomfort is coming from her back. We do need an updated lumbar spine MRI, for insurance coverage purposes she has had greater than 6 weeks of conservative treatment including NSAIDs, steroids, injections, and she has had unrevealing x-rays. Return to see me to go over MRI results and we will order her epidural. Of note she does have some Percocet for pain control in the meantime while we await her MRI.

## 2022-04-12 NOTE — Progress Notes (Signed)
    Procedures performed today:    None.  Independent interpretation of notes and tests performed by another provider:   None.  Brief History, Exam, Impression, and Recommendations:    Right lumbar radiculitis This is a pleasant 76 year old female, she has a history of mild lumbar degenerative disc disease, L4-5 and L5-S1, very mild facet arthritis, she has had a few lumbar epidurals with meager improvement. She has also had greater trochanteric bursa injections, today her pain is in the low back, quadratus lumborum with radiation to the anterior hip as well as anterolateral thigh. I am not able to reproduce her pain with rotation of her hip, FADIR or FABER signs, and there is no tenderness over the greater trochanter, no pain with resisted hip abduction, I do think her discomfort is coming from her back. We do need an updated lumbar spine MRI, for insurance coverage purposes she has had greater than 6 weeks of conservative treatment including NSAIDs, steroids, injections, and she has had unrevealing x-rays. Return to see me to go over MRI results and we will order her epidural. Of note she does have some Percocet for pain control in the meantime while we await her MRI.    ____________________________________________ Ihor Austin. [name], M.D., ABFM., CAQSM., AME. Primary Care and Sports Medicine [facility]  Adjunct Professor of Family Medicine  [place] [employer] [facility] [employer]  Restaurant manager, fast food

## 2022-04-16 ENCOUNTER — Other Ambulatory Visit: Payer: Self-pay

## 2022-04-16 NOTE — Telephone Encounter (Signed)
Patient left VM and states needs a refill on her hydrocodone and she states she doesn't need a full refill just a couple of tablets to get her through please advise med pended.

## 2022-04-17 ENCOUNTER — Ambulatory Visit (INDEPENDENT_AMBULATORY_CARE_PROVIDER_SITE_OTHER): Payer: Medicare PPO

## 2022-04-17 DIAGNOSIS — M5416 Radiculopathy, lumbar region: Secondary | ICD-10-CM | POA: Diagnosis not present

## 2022-04-17 DIAGNOSIS — M25551 Pain in right hip: Secondary | ICD-10-CM

## 2022-04-17 DIAGNOSIS — M545 Low back pain, unspecified: Secondary | ICD-10-CM

## 2022-04-17 DIAGNOSIS — M5136 Other intervertebral disc degeneration, lumbar region: Secondary | ICD-10-CM | POA: Diagnosis not present

## 2022-04-18 MED ORDER — HYDROCODONE-ACETAMINOPHEN 5-325 MG PO TABS: 1.0000 | ORAL_TABLET | Freq: Four times a day (QID) | ORAL | 0 refills | Status: DC | PRN

## 2022-04-23 ENCOUNTER — Other Ambulatory Visit: Payer: Self-pay | Admitting: Family Medicine

## 2022-04-29 ENCOUNTER — Telehealth: Payer: Self-pay

## 2022-04-29 DIAGNOSIS — M5416 Radiculopathy, lumbar region: Secondary | ICD-10-CM

## 2022-04-29 NOTE — Telephone Encounter (Signed)
Epidural ordered, [facility] should schedule

## 2022-04-29 NOTE — Addendum Note (Signed)
Addended by: Monica Becton on: 04/29/2022 03:41 PM   Modules accepted: Orders

## 2022-04-29 NOTE — Telephone Encounter (Signed)
Patient left a VM and states she would like a order put in to get a epidural cause her back and hips are really hurting and she wants relief please advise.

## 2022-04-29 NOTE — Telephone Encounter (Signed)
Patient notified

## 2022-05-06 ENCOUNTER — Ambulatory Visit (INDEPENDENT_AMBULATORY_CARE_PROVIDER_SITE_OTHER): Payer: Medicare PPO

## 2022-05-06 ENCOUNTER — Ambulatory Visit: Payer: Medicare PPO | Admitting: Sports Medicine

## 2022-05-06 DIAGNOSIS — M7061 Trochanteric bursitis, right hip: Secondary | ICD-10-CM | POA: Diagnosis not present

## 2022-05-06 DIAGNOSIS — M7062 Trochanteric bursitis, left hip: Secondary | ICD-10-CM | POA: Diagnosis not present

## 2022-05-06 DIAGNOSIS — M47816 Spondylosis without myelopathy or radiculopathy, lumbar region: Secondary | ICD-10-CM

## 2022-05-06 MED ORDER — TRIAMCINOLONE ACETONIDE 40 MG/ML IJ SUSP
80.0000 mg | Freq: Once | INTRAMUSCULAR | Status: AC
  Administered 2022-05-06: 80 mg via INTRAMUSCULAR

## 2022-05-06 NOTE — Assessment & Plan Note (Signed)
She Amanda Schmitt has noted good improvement in her axial low back pain, MRI did show some mild disc disease without any central or neuroforaminal stenosis, dominant finding was multilevel mild facet arthritis which can serve as a target for injections in the future if needed. She has improved with conservative treatment including heating pads, lidocaine patches. She will pace herself when doing cleaning and potentially wear a back brace when doing housework and return to see me as needed.

## 2022-05-06 NOTE — Assessment & Plan Note (Signed)
Pleasant 76 year old female, known bilateral trochanteric bursitis, bilateral lateral hip pain today, last injections were in September, she has been doing the hip conditioning, today we did bilateral greater trochanteric bursa injections, she had tenderness over the greater trochanters bilaterally. Return to see me as needed.

## 2022-05-06 NOTE — Progress Notes (Signed)
    Procedures performed today:    Procedure: Real-time Ultrasound Guided injection of the left greater trochanteric bursa Device: Samsung HS60  Verbal informed consent obtained.  Time-out conducted.  Noted no overlying erythema, induration, or other signs of local infection.  Skin prepped in a sterile fashion.  Local anesthesia: Topical Ethyl chloride.  With sterile technique and under real time ultrasound guidance: Noted normal-appearing hip abductor's, 1 cc Kenalog 40, 2 cc lidocaine, 2 cc bupivacaine injected easily Completed without difficulty  Advised to call if fevers/chills, erythema, induration, drainage, or persistent bleeding.  Images permanently stored and available for review in PACS.  Impression: Technically successful ultrasound guided injection.   Procedure: Real-time Ultrasound Guided injection of the right greater trochanteric bursa Device: Samsung HS60  Verbal informed consent obtained.  Time-out conducted.  Noted no overlying erythema, induration, or other signs of local infection.  Skin prepped in a sterile fashion.  Local anesthesia: Topical Ethyl chloride.  With sterile technique and under real time ultrasound guidance: Noted normal-appearing hip abductor's, 1 cc Kenalog 40, 2 cc lidocaine, 2 cc bupivacaine injected easily Completed without difficulty  Advised to call if fevers/chills, erythema, induration, drainage, or persistent bleeding.  Images permanently stored and available for review in PACS.  Impression: Technically successful ultrasound guided injection.  Independent interpretation of notes and tests performed by another provider:   None.  Brief History, Exam, Impression, and Recommendations:    Trochanteric bursitis of both hips Pleasant 76 year old female, known bilateral trochanteric bursitis, bilateral lateral hip pain today, last injections were in September, she has been doing the hip conditioning, today we did bilateral greater trochanteric  bursa injections, she had tenderness over the greater trochanters bilaterally. Return to see me as needed.  Lumbar spondylosis She [place] has noted good improvement in her axial low back pain, MRI did show some mild disc disease without any central or neuroforaminal stenosis, dominant finding was multilevel mild facet arthritis which can serve as a target for injections in the future if needed. She has improved with conservative treatment including heating pads, lidocaine patches. She will pace herself when doing cleaning and potentially wear a back brace when doing housework and return to see me as needed.    ____________________________________________ Ihor Austin. [name], M.D., ABFM., CAQSM., AME. Primary Care and Sports Medicine [facility]  Adjunct Professor of Family Medicine  [place] [employer] [facility] [employer]  Restaurant manager, fast food

## 2022-05-14 ENCOUNTER — Telehealth: Payer: Self-pay

## 2022-05-17 ENCOUNTER — Other Ambulatory Visit: Payer: Self-pay | Admitting: Sports Medicine

## 2022-05-17 ENCOUNTER — Other Ambulatory Visit: Payer: Self-pay

## 2022-05-17 ENCOUNTER — Other Ambulatory Visit: Payer: Self-pay | Admitting: Family Medicine

## 2022-05-17 ENCOUNTER — Telehealth: Payer: Self-pay | Admitting: Family Medicine

## 2022-05-17 DIAGNOSIS — M7061 Trochanteric bursitis, right hip: Secondary | ICD-10-CM

## 2022-05-17 MED ORDER — ZOLPIDEM TARTRATE ER 12.5 MG PO TBCR
12.5000 mg | EXTENDED_RELEASE_TABLET | Freq: Every day | ORAL | 0 refills | Status: DC
Start: 2022-05-17 — End: 2022-05-17

## 2022-05-17 NOTE — Progress Notes (Signed)
Completed.

## 2022-05-17 NOTE — Telephone Encounter (Signed)
Pt left a voicemail stating her pharmacy told her the prescription that was sent today for Lorrin Mais was not filed out correctly.

## 2022-05-17 NOTE — Telephone Encounter (Signed)
Please contact the patient to schedule 6 month follow-up in March. Thanks

## 2022-06-14 ENCOUNTER — Encounter: Payer: Self-pay | Admitting: Family Medicine

## 2022-06-14 ENCOUNTER — Ambulatory Visit: Payer: Medicare PPO | Admitting: Family Medicine

## 2022-06-14 VITALS — BP 190/90 | HR 70 | Ht 60.0 in | Wt 124.0 lb

## 2022-06-14 DIAGNOSIS — R519 Headache, unspecified: Secondary | ICD-10-CM | POA: Insufficient documentation

## 2022-06-14 DIAGNOSIS — G44209 Tension-type headache, unspecified, not intractable: Secondary | ICD-10-CM

## 2022-06-14 MED ORDER — NURTEC 75 MG PO TBDP: ORAL_TABLET | ORAL | 0 refills | Status: DC

## 2022-06-14 MED ORDER — DEXAMETHASONE SODIUM PHOSPHATE 10 MG/ML IJ SOLN
10.0000 mg | Freq: Once | INTRAMUSCULAR | Status: AC
  Administered 2022-06-14: 10 mg via INTRAMUSCULAR

## 2022-06-14 MED ORDER — CLONAZEPAM 0.5 MG PO TABS: ORAL_TABLET | ORAL | 1 refills | Status: DC

## 2022-06-14 MED ORDER — KETOROLAC TROMETHAMINE 30 MG/ML IJ SOLN
30.0000 mg | Freq: Once | INTRAMUSCULAR | Status: AC
  Administered 2022-06-14: 30 mg via INTRAMUSCULAR

## 2022-06-14 NOTE — Patient Instructions (Addendum)
If headache continues to worsen or you develop new symptoms please go to the ER.   Return in 2-3 days for BP check

## 2022-06-14 NOTE — Progress Notes (Signed)
Amanda Schmitt - 77 y.o. female MRN [id number]  Date of birth: 09/21/1945  Subjective Chief Complaint  Patient presents with   Headache    HPI Amanda Schmitt is a 77 y.o. female here today with complaint of headache.  She has had a headache for a little over a week.  Headache is located on the right side of her head.  She does have history of migraines but has been several years since she has had a migraine episode.  She has tried anti-inflammatories without much relief at home.  Pain radiates from the back of her neck into the front of her head.  She has not had any neurological changes including vision changes, ringing in ears, weakness, numbness or tingling.  She denies fever or chills.  She has been told at 1 point she may have trigeminal neuralgia when she had headaches in the past.  ROS:  A comprehensive ROS was completed and negative except as noted per HPI  Allergies  Allergen Reactions   Moxifloxacin Other (See Comments)    Flu like symptoms   Nitrofuran Derivatives    Codeine Nausea Only   Tramadol Nausea Only    Past Medical History:  Diagnosis Date   Depression     Past Surgical History:  Procedure Laterality Date   ABDOMINAL HYSTERECTOMY     ABDOMINAL HYSTERECTOMY     Bowel blockage     BOWEL RESECTION     CESAREAN SECTION      Social History   Socioeconomic History   Marital status: Widowed    Spouse name: Not on file   Number of children: 2   Years of education: 16   Highest education level: Bachelor's degree (e.g., BA, AB, BS)  Occupational History   Occupation: Retired    Comment: Pharmacist, hospital  Tobacco Use   Smoking status: Former    Packs/day: 0.50    Years: 26.00    Total pack years: 13.00    Types: Cigarettes    Quit date: 1999    Years since quitting: 25.1   Smokeless tobacco: Never  Substance and Sexual Activity   Alcohol use: Yes    Alcohol/week: 2.0 standard drinks of alcohol    Types: 2 Glasses of wine per week    Comment: per night    Drug use: No   Sexual activity: Not Currently  Other Topics Concern   Not on file  Social History Narrative   Lives alone. Volunteers at [employer]. Enjoys doing crossword puzzles, reading and painting furniture.   Social Determinants of Health   Financial Resource Strain: Low Risk  (05/23/2020)   Overall Financial Resource Strain (CARDIA)    Difficulty of Paying Living Expenses: Not hard at all  Food Insecurity: No Food Insecurity (11/18/2021)   Hunger Vital Sign    Worried About Running Out of Food in the Last Year: Never true    Ran Out of Food in the Last Year: Never true  Transportation Needs: No Transportation Needs (11/18/2021)   PRAPARE - Hydrologist (Medical): No    Lack of Transportation (Non-Medical): No  Physical Activity: Inactive (11/18/2021)   Exercise Vital Sign    Days of Exercise per Week: 0 days    Minutes of Exercise per Session: 0 min  Stress: No Stress Concern Present (11/18/2021)   [place]    Feeling of Stress : Not at all  Social Connections: Socially Isolated (  11/18/2021)   Social Connection and Isolation Panel [NHANES]    Frequency of Communication with Friends and Family: More than three times a week    Frequency of Social Gatherings with Friends and Family: Three times a week    Attends Religious Services: Never    Active Member of Clubs or Organizations: No    Attends Archivist Meetings: Never    Marital Status: Widowed    Family History  Problem Relation Age of Onset   Cancer Father        unorgin    Health Maintenance  Topic Date Due   INFLUENZA VACCINE  08/08/2022 (Originally 12/08/2021)   MAMMOGRAM  11/19/2022 (Originally 08/22/2022)   DEXA SCAN  11/19/2022 (Originally 05/24/2021)   Hepatitis C Screening  11/19/2022 (Originally 06/24/1963)   Medicare Annual Wellness (AWV)  11/19/2022   DTaP/Tdap/Td (3 - Td or Tdap) 08/24/2026    Pneumonia Vaccine 36+ Years old  Completed   COVID-19 Vaccine  Completed   Zoster Vaccines- Shingrix  Completed   HPV VACCINES  Aged Out   COLONOSCOPY (Pts 45-81yrs Insurance coverage will need to be confirmed)  Discontinued     ----------------------------------------------------------------------------------------------------------------------------------------------------------------------------------------------------------------- Physical Exam BP (!) 190/90 (BP Location: Left Arm, Patient Position: Sitting, Cuff Size: Normal)   Pulse 70   Ht 5' (1.524 m)   Wt 124 lb (56.2 kg)   SpO2 99%   BMI 24.22 kg/m   Physical Exam Constitutional:      Appearance: She is well-developed.  HENT:     Head: Normocephalic and atraumatic.     Right Ear: Tympanic membrane normal.     Left Ear: Tympanic membrane normal.  Eyes:     General: No scleral icterus. Cardiovascular:     Rate and Rhythm: Normal rate and regular rhythm.  Pulmonary:     Effort: Pulmonary effort is normal.     Breath sounds: Normal breath sounds.  Neurological:     General: No focal deficit present.     Mental Status: She is alert and oriented to person, place, and time.     Cranial Nerves: No cranial nerve deficit.     Motor: No weakness.     Gait: Gait normal.  Psychiatric:        Mood and Affect: Mood normal.        Behavior: Behavior normal.     ------------------------------------------------------------------------------------------------------------------------------------------------------------------------------------------------------------------- Assessment and Plan  Headache Blood pressure is significantly elevated today.  Unclear if her blood pressure is causing headaches or if the pain from the headache is causing her blood pressure to be more elevated.  Treating acute headache with Toradol as well as dexamethasone to help with prevention of rebound.  Recommend increase fluids.  Given sample of  Nurtec if still having headache into this evening.  We discussed red flags.  Recommend that if she note worsening headache or development of new neurological changes I would recommend that she be seen in the ER.   Meds ordered this encounter  Medications   clonazePAM (KLONOPIN) 0.5 MG tablet    Sig: TAKE 1 TABLET BY MOUTH TWICE A DAY AS NEEDED FOR ANXIETY    Dispense:  20 tablet    Refill:  1    Not to exceed 4 additional fills before 03/09/2022   Rimegepant Sulfate (NURTEC) 75 MG TBDP    Sig: Dissolve 1 tab under tongue daily as needed for migraine Lot: [id number] Exp: 03/2024    Dispense:  2 tablet    Refill:  0  dexamethasone (DECADRON) injection 10 mg   ketorolac (TORADOL) 30 MG/ML injection 30 mg    Return in about 2 days (around 06/16/2022) for Nurse visit-BP check.    This visit occurred during the SARS-CoV-2 public health emergency.  Safety protocols were in place, including screening questions prior to the visit, additional usage of staff PPE, and extensive cleaning of exam room while observing appropriate contact time as indicated for disinfecting solutions.

## 2022-06-14 NOTE — Assessment & Plan Note (Signed)
Blood pressure is significantly elevated today.  Unclear if her blood pressure is causing headaches or if the pain from the headache is causing her blood pressure to be more elevated.  Treating acute headache with Toradol as well as dexamethasone to help with prevention of rebound.  Recommend increase fluids.  Given sample of Nurtec if still having headache into this evening.  We discussed red flags.  Recommend that if she note worsening headache or development of new neurological changes I would recommend that she be seen in the ER.

## 2022-06-16 ENCOUNTER — Ambulatory Visit (INDEPENDENT_AMBULATORY_CARE_PROVIDER_SITE_OTHER): Payer: Medicare PPO | Admitting: Family Medicine

## 2022-06-16 VITALS — BP 123/68 | HR 73 | Resp 20

## 2022-06-16 DIAGNOSIS — R030 Elevated blood-pressure reading, without diagnosis of hypertension: Secondary | ICD-10-CM

## 2022-06-16 NOTE — Progress Notes (Signed)
   Subjective:    Patient ID: Amanda Schmitt, female    DOB: [birth year], 77 y.o.   MRN: [id number]  HPI  Patient here for Blood Pressure check. Denies trouble sleeping, palpitations or medication problems.  Patient stated that migraines is much better. Patient did not have to take the Nurtec after having the cocktail in the office. She will keep the Nurtec for future migraines.  Review of Systems     Objective:   Physical Exam        Assessment & Plan:   Patient advised to log blood pressures and follow up with Luetta Nutting in 1 month.

## 2022-06-16 NOTE — Progress Notes (Signed)
Medical screening examination/treatment was performed by qualified clinical staff member and as supervising physician I was immediately available for consultation/collaboration. I have reviewed documentation and agree with assessment and plan.  Fallen Crisostomo, DO  

## 2022-06-28 ENCOUNTER — Ambulatory Visit: Payer: Medicare PPO | Admitting: Family Medicine

## 2022-07-02 ENCOUNTER — Ambulatory Visit: Payer: Medicare PPO | Admitting: Sports Medicine

## 2022-07-02 ENCOUNTER — Ambulatory Visit (INDEPENDENT_AMBULATORY_CARE_PROVIDER_SITE_OTHER): Payer: Medicare PPO

## 2022-07-02 DIAGNOSIS — M5412 Radiculopathy, cervical region: Secondary | ICD-10-CM

## 2022-07-02 DIAGNOSIS — M542 Cervicalgia: Secondary | ICD-10-CM | POA: Diagnosis not present

## 2022-07-02 MED ORDER — PREDNISONE 50 MG PO TABS: ORAL_TABLET | ORAL | 0 refills | Status: DC

## 2022-07-02 NOTE — Assessment & Plan Note (Signed)
Pleasant 77 year old female, she had increasing right-sided neck pain with radiation down the right arm sometimes causing numbness and tingling in the right thumb. Exam is unrevealing, good motion, good strength, she does have some tenderness right periscapular, this is likely cervical DDD, adding 5 days of prednisone, she can do over-the-counter NSAIDs afterwards, formal physical therapy, x-rays, return to see me in 6 weeks, MR for interventional planning if no better. She does understand the anatomy and pathophysiology and understands that this is a chronic process that we will likely need to treat longitudinally.

## 2022-07-02 NOTE — Progress Notes (Signed)
    Procedures performed today:    None.  Independent interpretation of notes and tests performed by another provider:   None.  Brief History, Exam, Impression, and Recommendations:    Right cervical radiculopathy Pleasant 77 year old female, she had increasing right-sided neck pain with radiation down the right arm sometimes causing numbness and tingling in the right thumb. Exam is unrevealing, good motion, good strength, she does have some tenderness right periscapular, this is likely cervical DDD, adding 5 days of prednisone, she can do over-the-counter NSAIDs afterwards, formal physical therapy, x-rays, return to see me in 6 weeks, MR for interventional planning if no better. She does understand the anatomy and pathophysiology and understands that this is a chronic process that we will likely need to treat longitudinally.  Chronic process with exacerbation and pharmacologic intervention  ____________________________________________ Gwen Her. Dianah Field, M.D., ABFM., CAQSM., AME. Primary Care and Sports Medicine [facility]  Adjunct Professor of [employer]  Risk manager

## 2022-07-05 NOTE — Therapy (Deleted)
OUTPATIENT PHYSICAL THERAPY CERVICAL EVALUATION   Patient Name: Amanda Schmitt MRN: [id number] DOB:16-Jun-1945, 77 y.o., female Today's Date: 07/05/2022  END OF SESSION:   Past Medical History:  Diagnosis Date   Depression    Past Surgical History:  Procedure Laterality Date   ABDOMINAL HYSTERECTOMY     ABDOMINAL HYSTERECTOMY     Bowel blockage     BOWEL RESECTION     CESAREAN SECTION     Patient Active Problem List   Diagnosis Date Noted   Right cervical radiculopathy 07/02/2022   Headache 06/14/2022   Swelling of face 03/01/2022   Neck swelling 03/01/2022   Elevated blood-pressure reading without diagnosis of hypertension 01/11/2022   B12 deficiency 01/11/2022   Well adult exam 07/08/2021   Transient speech disturbance 07/08/2021   Recurrent UTI 09/23/2020   Lumbar spondylosis 06/30/2020   Trochanteric bursitis of both hips 04/15/2020   GAD (generalized anxiety disorder) 10/16/2019   Trigeminal neuralgia 09/27/2019   Palpitations 09/27/2019   Epigastric pain 09/27/2019   Primary insomnia 06/22/2016   Osteopenia of multiple sites 08/11/2015   Mixed hyperlipidemia 08/11/2015   Major depressive disorder in full remission ([place]) 08/11/2015   Senile nuclear sclerosis 07/25/2014    PCP: Dr Luetta Nutting   REFERRING PROVIDER: Dr Aundria Mems  REFERRING DIAG: Cervical radiculopathy  THERAPY DIAG:  No diagnosis found.  Rationale for Evaluation and Treatment: Rehabilitation  ONSET DATE: ***  SUBJECTIVE:                                                                                                                                                                                                         SUBJECTIVE STATEMENT: ***  PERTINENT HISTORY:  ***  PAIN:  Are you having pain? Yes: NPRS scale: ***/10 Pain location: *** Pain description: *** Aggravating factors: *** Relieving factors: ***  PRECAUTIONS: None  WEIGHT BEARING RESTRICTIONS:  No  FALLS:  Has patient fallen in last 6 months? No  LIVING ENVIRONMENT: Lives with: lives alone Lives in: House/apartment  OCCUPATION: ***  PLOF: Independent  PATIENT GOALS: ***  NEXT MD VISIT: Dr Zigmund Daniel 07/20/22        Dr Dianah Field 4/5 24 OBJECTIVE:   DIAGNOSTIC FINDINGS:  Xray C-spine 07/02/22: Trace anterolisthesis C4 versus C5. 2. Degenerative disc disease and uncovertebral degenerative changes  PATIENT SURVEYS:  FOTO ***  COGNITION: Overall cognitive status: Within functional limits for tasks assessed  SENSATION: {sensation:27233}  POSTURE: Patient presents with head forward posture with increased thoracic kyphosis; shoulders rounded and elevated; scapulae abducted and rotated along the thoracic  spine; head of the humerus anterior in orientation.   PALPATION: ***   CERVICAL ROM:   Active ROM A/PROM (deg) eval  Flexion   Extension   Right lateral flexion   Left lateral flexion   Right rotation   Left rotation    (Blank rows = not tested)  UPPER EXTREMITY ROM:  Active ROM Right eval Left eval  Shoulder flexion    Shoulder extension    Shoulder abduction    Shoulder adduction    Shoulder extension    Shoulder internal rotation    Shoulder external rotation    Elbow flexion    Elbow extension    Wrist flexion    Wrist extension    Wrist ulnar deviation    Wrist radial deviation    Wrist pronation    Wrist supination     (Blank rows = not tested)  UPPER EXTREMITY MMT:  MMT Right eval Left eval  Shoulder flexion    Shoulder extension    Shoulder abduction    Shoulder adduction    Shoulder extension    Shoulder internal rotation    Shoulder external rotation    Middle trapezius    Lower trapezius    Elbow flexion    Elbow extension    Wrist flexion    Wrist extension    Wrist ulnar deviation    Wrist radial deviation    Wrist pronation    Wrist supination    Grip strength     (Blank rows = not tested)  CERVICAL SPECIAL  TESTS:     [facility] Adult PT Treatment:                                                DATE: 07/05/22 Therapeutic Exercise: *** Manual Therapy: *** Neuromuscular re-ed: *** Therapeutic Activity: *** Gait: *** Modalities: *** Self Care: ***   PATIENT EDUCATION:  Education details: POC; HEP  Person educated: Patient Education method: Explanation, Demonstration, Tactile cues, Verbal cues, and Handouts Education comprehension: verbalized understanding, returned demonstration, verbal cues required, tactile cues required, and needs further education  HOME EXERCISE PROGRAM: ***  ASSESSMENT:  CLINICAL IMPRESSION: Patient is a 77 y.o. female who was seen today for physical therapy evaluation and treatment for Rt cervical radiculopathy.   OBJECTIVE IMPAIRMENTS: decreased activity tolerance, decreased ROM, decreased strength, hypomobility, increased fascial restrictions, increased muscle spasms, impaired flexibility, impaired sensation, impaired UE functional use, postural dysfunction, and pain.   ACTIVITY LIMITATIONS: carrying, lifting, bending, sitting, and standing  PARTICIPATION LIMITATIONS: meal prep, cleaning, laundry, driving, and shopping  PERSONAL FACTORS: Age, Past/current experiences, and 1-2 comorbidities including arthritic changes are also affecting patient's functional outcome.   REHAB POTENTIAL: Good  CLINICAL DECISION MAKING: Stable/uncomplicated  EVALUATION COMPLEXITY: Low   GOALS: Goals reviewed with patient? Yes  SHORT TERM GOALS: Target date: 08/03/2022  *** Baseline:  Goal status: INITIAL  2.  *** Baseline:  Goal status: INITIAL   LONG TERM GOALS: Target date: 08/31/2022   *** Baseline:  Goal status: INITIAL  2.  *** Baseline:  Goal status: INITIAL  3.  *** Baseline:  Goal status: INITIAL  4.  *** Baseline:  Goal status: INITIAL  5.  *** Baseline:  Goal status: INITIAL  6.  *** Baseline:  Goal status:  INITIAL   PLAN:  PT FREQUENCY: 2x/week  PT DURATION: 8 weeks  PLANNED INTERVENTIONS: Therapeutic exercises, Therapeutic activity, Neuromuscular re-education, Patient/Family education, Self Care, Joint mobilization, Aquatic Therapy, Dry Needling, Electrical stimulation, Spinal mobilization, Cryotherapy, Moist heat, Taping, Traction, Ultrasound, Ionotophoresis '4mg'$ /ml Dexamethasone, Manual therapy, and Re-evaluation  PLAN FOR NEXT SESSION: review and progress exercise; continue with postural correction and postural strengthening; manual work, DN, modalities as indicated    [employer], PT 07/05/2022, 4:52 PM

## 2022-07-06 ENCOUNTER — Ambulatory Visit: Payer: Medicare PPO | Admitting: Rehabilitative and Restorative Service Providers"

## 2022-07-08 NOTE — Therapy (Signed)
OUTPATIENT PHYSICAL THERAPY CERVICAL EVALUATION   Patient Name: Amanda Schmitt MRN: [id number] DOB:[birth year], 77 y.o., female Today's Date: 07/08/2022  END OF SESSION:   Past Medical History:  Diagnosis Date   Depression    Past Surgical History:  Procedure Laterality Date   ABDOMINAL HYSTERECTOMY     ABDOMINAL HYSTERECTOMY     Bowel blockage     BOWEL RESECTION     CESAREAN SECTION     Patient Active Problem List   Diagnosis Date Noted   Right cervical radiculopathy 07/02/2022   Headache 06/14/2022   Swelling of face 03/01/2022   Neck swelling 03/01/2022   Elevated blood-pressure reading without diagnosis of hypertension 01/11/2022   B12 deficiency 01/11/2022   Well adult exam 07/08/2021   Transient speech disturbance 07/08/2021   Recurrent UTI 09/23/2020   Lumbar spondylosis 06/30/2020   Trochanteric bursitis of both hips 04/15/2020   GAD (generalized anxiety disorder) 10/16/2019   Trigeminal neuralgia 09/27/2019   Palpitations 09/27/2019   Epigastric pain 09/27/2019   Primary insomnia 06/22/2016   Osteopenia of multiple sites 08/11/2015   Mixed hyperlipidemia 08/11/2015   Major depressive disorder in full remission ([place]) 08/11/2015   Senile nuclear sclerosis 07/25/2014    PCP: Dr Luetta Nutting  REFERRING PROVIDER: Dr Aundria Mems   REFERRING DIAG: Rt cervical radiculopathy  THERAPY DIAG:  No diagnosis found.  Rationale for Evaluation and Treatment: Rehabilitation  ONSET DATE: 06/10/22  SUBJECTIVE:                                                                                                                                                                                                         SUBJECTIVE STATEMENT: Patient reports that she has had increased pain in the Rt cervical and upper trap area in the past month.   PERTINENT HISTORY:  Bursitis in bilat hips; LBP; migraines primarily Rt side  PAIN:  Are you having pain? Yes: NPRS  scale: 5/10 Pain location: Rt neck and upper trap  Pain description: dull, aching, sharp with turning head in certain way Aggravating factors: sitting to long; lying wrong at night  Relieving factors: icy hot; heat  PRECAUTIONS: None  WEIGHT BEARING RESTRICTIONS: No  FALLS:  Has patient fallen in last 6 months? 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 Degenerative disc disease and uncovertebral degenerative changes   PATIENT SURVEYS:  FOTO 45; goal 65  COGNITION: Overall cognitive status: Within functional limits for tasks assessed  SENSATION: WFL  POSTURE: Patient presents with head forward posture with increased thoracic kyphosis; shoulders rounded and elevated; scapulae abducted and rotated along the thoracic spine; head of the humerus anterior in orientation.  PALPATION: Muscular tightness Rt > Lt ant/lat/post cervical musculature; pecs; upper trap; thoracic paraspinals    CERVICAL ROM:   Active ROM A/PROM (deg) eval  Flexion 52  Extension 48  Right lateral flexion 36  Left lateral flexion 27 tight  Right rotation 67  Left rotation 59 tight    (Blank rows = not tested)  UPPER EXTREMITY ROM: WNL's bilat    UPPER EXTREMITY MMT:  MMT Right eval Left eval  Shoulder flexion 4+ 4+  Shoulder extension    Shoulder abduction    Shoulder adduction    Shoulder extension    Shoulder internal rotation    Shoulder external rotation 4- 4  Middle trapezius 4-  4  Lower trapezius    Elbow flexion    Elbow extension    Wrist flexion    Wrist extension    Wrist ulnar deviation    Wrist radial deviation    Wrist pronation    Wrist supination    Grip strength     (Blank rows = not tested)  CERVICAL SPECIAL TESTS:  Distraction test: Negative   [facility] Adult PT Treatment:                                                DATE: 07/12/32 Therapeutic Exercise:  Supine chin tuck 10 sec x 5 Prolonged snow angel ~ 2 min UE's ~ 75 deg  Standing  Chin tuck 5 sec x 5 Scap squeeze 5 sec x 5 Upper trap stretch 10 sec x 3 L's 2 sec x 5  Doorway stretch 30 sec 3 positions x 1  Manual Therapy: Supine STM ant/lat/post cervical musculature into upper traps Neuromuscular re-ed: Working on posture and alignment  Therapeutic Activity: Sitting posture and alignment; using pillows to support UE's; lying down using towels to support cervical spine, pillows btn LE's and pillow to hug  Modalities: TENS Rt upper quarter x 15 min  Moist heat Rt shoulder girdle  Self Care: Sitting with noodle    PATIENT EDUCATION:  Education details: POC; HEP  Person educated: Patient Education method: Consulting civil engineer, Demonstration, Tactile cues, Verbal cues, and Handouts Education comprehension: verbalized understanding, returned demonstration, verbal cues required, tactile cues required, and needs further education  HOME EXERCISE PROGRAM: Access Code: [email or web address] URL: [email or web address] Date: 07/13/2022 Prepared by: Gillermo Murdoch  Exercises - Supine Cervical Retraction with Towel  - 2 x daily - 7 x weekly - 1 sets - 5-10 reps - 10 sec  hold - Supine Chest Stretch on Foam Roll  - 2 x daily - 7 x weekly - 1 sets - 1 reps - 2-5 min  sec  hold - Seated Cervical Retraction  - 3 x daily - 7 x weekly -  1 sets - 10 reps - Standing Scapular Retraction  - 3 x daily - 7 x weekly - 1 sets - 10 reps - 10 hold - Shoulder External Rotation and Scapular Retraction  - 3 x daily -  7 x weekly - 1 sets - 10 reps -   hold - Standing Upper Trapezius Stretch  - 2 x daily - 7 x weekly - 1 sets - 3 reps - 10 sec  hold - Doorway Pec Stretch at 60 Degrees Abduction  - 3 x daily - 7 x weekly - 1 sets - 3 reps - Doorway Pec Stretch at 90 Degrees Abduction  - 3 x daily - 7 x weekly - 1 sets - 3 reps - 30 seconds  hold - Doorway Pec Stretch at 120 Degrees Abduction  - 3 x daily - 7 x weekly - 1 sets - 3 reps - 30 second hold  hold  Patient Education - Biomedical scientist - Office Posture - TENS Unit - Trigger Point Dry Needling  ASSESSMENT:  CLINICAL IMPRESSION: Patient is a 77 y.o. female who was seen today for physical therapy evaluation and treatment for Rt cervical radiculopathy. She has poor posture and alignment; limited cervical land thoracic mobility/ROM; muscular tightness to palpation; pain with functional activities   OBJECTIVE IMPAIRMENTS: decreased activity tolerance, decreased mobility, decreased ROM, decreased strength, hypomobility, impaired flexibility, improper body mechanics, postural dysfunction, and pain.   ACTIVITY LIMITATIONS: carrying, lifting, and sleeping  PARTICIPATION LIMITATIONS: cleaning, laundry, driving, and yard work  PERSONAL FACTORS: Fitness, Past/current experiences, and 1-2 comorbidities: arthritis; LBP; bursitis  are also affecting patient's functional outcome.   REHAB POTENTIAL: Good  CLINICAL DECISION MAKING: Stable/uncomplicated  EVALUATION COMPLEXITY: Low   GOALS: Goals reviewed with patient? Yes  SHORT TERM GOALS: Target date: 08/10/2022  Independent in initial HEP  Baseline:  Goal status: INITIAL  2.  Patient verbalizes and demonstrates improved posture and alignment with improved transitional and positional movements  Baseline:  Goal status: INITIAL    LONG TERM GOALS: Target date: 09/07/2022   Improve cervical ROM Lt rotation and lateral flexion equal to Rt  Baseline:  Goal status: INITIAL  2.  5/5  strength bilat UE's Baseline:  Goal status: INITIAL  3.  Improve posture and alignment with patient to demonstrate improved upright posture with posterior shoulder girdle engaged  Baseline:  Goal status: INITIAL  4.  Patient reports 75-100% decrease in pain  Baseline:  Goal status: INITIAL  5.  Independent in HEP  Baseline:  Goal status: INITIAL  6.  Improve functional limitation score to 65  Baseline:  Goal status: INITIAL   PLAN:  PT FREQUENCY: 2x/week  PT DURATION: 8 weeks  PLANNED INTERVENTIONS: Therapeutic exercises, Therapeutic activity, Neuromuscular re-education, Patient/Family education, Self Care, Joint mobilization, Aquatic Therapy, Dry Needling, Electrical stimulation, Spinal mobilization, Cryotherapy, Moist heat, Taping, Ultrasound, Ionotophoresis '4mg'$ /ml Dexamethasone, Manual therapy, and Re-evaluation  PLAN FOR NEXT SESSION: review and progress exercise program; continue postural correction and education; manual work, DN, modalities as indicate    [employer], PT 07/08/2022, 5:42 PM

## 2022-07-13 ENCOUNTER — Ambulatory Visit: Payer: Medicare PPO | Attending: Sports Medicine | Admitting: Rehabilitative and Restorative Service Providers"

## 2022-07-13 ENCOUNTER — Encounter: Payer: Self-pay | Admitting: Rehabilitative and Restorative Service Providers"

## 2022-07-13 ENCOUNTER — Other Ambulatory Visit: Payer: Self-pay

## 2022-07-13 DIAGNOSIS — M6281 Muscle weakness (generalized): Secondary | ICD-10-CM | POA: Insufficient documentation

## 2022-07-13 DIAGNOSIS — R29898 Other symptoms and signs involving the musculoskeletal system: Secondary | ICD-10-CM | POA: Diagnosis not present

## 2022-07-13 DIAGNOSIS — R293 Abnormal posture: Secondary | ICD-10-CM | POA: Insufficient documentation

## 2022-07-13 DIAGNOSIS — M542 Cervicalgia: Secondary | ICD-10-CM | POA: Insufficient documentation

## 2022-07-13 DIAGNOSIS — M5412 Radiculopathy, cervical region: Secondary | ICD-10-CM | POA: Diagnosis not present

## 2022-07-14 DIAGNOSIS — H26492 Other secondary cataract, left eye: Secondary | ICD-10-CM | POA: Diagnosis not present

## 2022-07-15 ENCOUNTER — Ambulatory Visit: Payer: Medicare PPO | Admitting: Family Medicine

## 2022-07-20 ENCOUNTER — Ambulatory Visit: Payer: Medicare PPO | Admitting: Family Medicine

## 2022-07-20 ENCOUNTER — Encounter: Payer: Self-pay | Admitting: Family Medicine

## 2022-07-20 ENCOUNTER — Encounter: Payer: Self-pay | Admitting: Rehabilitative and Restorative Service Providers"

## 2022-07-20 ENCOUNTER — Ambulatory Visit: Payer: Medicare PPO | Admitting: Rehabilitative and Restorative Service Providers"

## 2022-07-20 VITALS — BP 134/77 | HR 74 | Ht 60.0 in | Wt 122.0 lb

## 2022-07-20 DIAGNOSIS — M8589 Other specified disorders of bone density and structure, multiple sites: Secondary | ICD-10-CM

## 2022-07-20 DIAGNOSIS — R293 Abnormal posture: Secondary | ICD-10-CM | POA: Diagnosis not present

## 2022-07-20 DIAGNOSIS — M542 Cervicalgia: Secondary | ICD-10-CM

## 2022-07-20 DIAGNOSIS — F411 Generalized anxiety disorder: Secondary | ICD-10-CM | POA: Diagnosis not present

## 2022-07-20 DIAGNOSIS — R29898 Other symptoms and signs involving the musculoskeletal system: Secondary | ICD-10-CM

## 2022-07-20 DIAGNOSIS — R030 Elevated blood-pressure reading, without diagnosis of hypertension: Secondary | ICD-10-CM

## 2022-07-20 DIAGNOSIS — L659 Nonscarring hair loss, unspecified: Secondary | ICD-10-CM

## 2022-07-20 DIAGNOSIS — E538 Deficiency of other specified B group vitamins: Secondary | ICD-10-CM

## 2022-07-20 DIAGNOSIS — M5412 Radiculopathy, cervical region: Secondary | ICD-10-CM | POA: Diagnosis not present

## 2022-07-20 DIAGNOSIS — F5101 Primary insomnia: Secondary | ICD-10-CM | POA: Diagnosis not present

## 2022-07-20 DIAGNOSIS — E782 Mixed hyperlipidemia: Secondary | ICD-10-CM | POA: Diagnosis not present

## 2022-07-20 DIAGNOSIS — G4486 Cervicogenic headache: Secondary | ICD-10-CM | POA: Diagnosis not present

## 2022-07-20 DIAGNOSIS — M6281 Muscle weakness (generalized): Secondary | ICD-10-CM | POA: Diagnosis not present

## 2022-07-20 MED ORDER — ZOLPIDEM TARTRATE ER 6.25 MG PO TBCR: 6.2500 mg | EXTENDED_RELEASE_TABLET | Freq: Every evening | ORAL | 1 refills | Status: DC | PRN

## 2022-07-20 NOTE — Assessment & Plan Note (Signed)
Reducing Ambien 6.25 mg once daily.

## 2022-07-20 NOTE — Assessment & Plan Note (Signed)
Encouraged weightbearing exercise.  Updating vitamin D levels.

## 2022-07-20 NOTE — Assessment & Plan Note (Signed)
Updated B12 levels.  Checking iron panel as well.

## 2022-07-20 NOTE — Assessment & Plan Note (Signed)
Doing well on Lexapro at current strength.  Will plan to continue.

## 2022-07-20 NOTE — Therapy (Signed)
OUTPATIENT PHYSICAL THERAPY CERVICAL TREATMENT   Patient Name: Amanda Schmitt MRN: [id number] DOB:1945/07/25, 77 y.o., female Today's Date: 07/20/2022  END OF SESSION:  PT End of Session - 07/20/22 1453     Visit Number 2    Number of Visits 16    Date for PT Re-Evaluation 09/07/22    Authorization Type state health humana plan    PT Start Time 1449    PT Stop Time 1538    PT Time Calculation (min) 49 min    Activity Tolerance Patient tolerated treatment well             Past Medical History:  Diagnosis Date   Depression    Past Surgical History:  Procedure Laterality Date   ABDOMINAL HYSTERECTOMY     ABDOMINAL HYSTERECTOMY     Bowel blockage     BOWEL RESECTION     CESAREAN SECTION     Patient Active Problem List   Diagnosis Date Noted   Right cervical radiculopathy 07/02/2022   Headache 06/14/2022   Swelling of face 03/01/2022   Neck swelling 03/01/2022   Elevated blood-pressure reading without diagnosis of hypertension 01/11/2022   B12 deficiency 01/11/2022   Well adult exam 07/08/2021   Transient speech disturbance 07/08/2021   Recurrent UTI 09/23/2020   Lumbar spondylosis 06/30/2020   Trochanteric bursitis of both hips 04/15/2020   GAD (generalized anxiety disorder) 10/16/2019   Trigeminal neuralgia 09/27/2019   Palpitations 09/27/2019   Epigastric pain 09/27/2019   Primary insomnia 06/22/2016   Osteopenia of multiple sites 08/11/2015   Mixed hyperlipidemia 08/11/2015   Major depressive disorder in full remission ([place]) 08/11/2015   Senile nuclear sclerosis 07/25/2014    PCP: Dr Luetta Nutting  REFERRING PROVIDER: Dr Aundria Mems   REFERRING DIAG: Rt cervical radiculopathy  THERAPY DIAG:  Cervical pain  Other symptoms and signs involving the musculoskeletal system  Abnormal posture  Muscle weakness (generalized)  Rationale for Evaluation and Treatment: Rehabilitation  ONSET DATE: 06/10/22  SUBJECTIVE:                                                                                                                                                                                                          SUBJECTIVE STATEMENT: Patient reports that she has had some improvement with exercises.   PERTINENT HISTORY:  increased pain in the Rt cervical and upper trap area in the past monthBursitis in bilat hips; LBP; migraines primarily Rt side  PAIN:  Are you having pain? Yes: NPRS scale: 2-3/10 Pain location: Rt neck and upper  trap  Pain description: dull, aching, sharp with turning head in certain way Aggravating factors: sitting to long; lying wrong at night  Relieving factors: icy hot; heat  PRECAUTIONS: None  WEIGHT BEARING RESTRICTIONS: No  FALLS:  Has patient fallen in last 6 months? 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 Degenerative disc disease and uncovertebral degenerative changes   PATIENT SURVEYS:  FOTO 45; goal 65  POSTURE: Patient presents with head forward posture with increased thoracic kyphosis; shoulders rounded and elevated; scapulae abducted and rotated along the thoracic spine; head of the humerus anterior in orientation.  PALPATION: Muscular tightness Rt > Lt ant/lat/post cervical musculature; pecs; upper trap; thoracic paraspinals    CERVICAL ROM:   Active ROM A/PROM (deg) eval  Flexion 52  Extension 48  Right lateral flexion 36  Left lateral flexion 27 tight  Right rotation 67  Left rotation 59 tight    (Blank rows = not tested)  UPPER EXTREMITY ROM: WNL's bilat    UPPER EXTREMITY MMT:  MMT Right eval Left eval  Shoulder flexion 4+  4+  Shoulder extension    Shoulder abduction    Shoulder adduction    Shoulder extension    Shoulder internal rotation    Shoulder external rotation 4- 4  Middle trapezius 4- 4  Lower trapezius    Elbow flexion    Elbow extension    Wrist flexion    Wrist extension    Wrist ulnar deviation    Wrist radial deviation    Wrist pronation    Wrist supination    Grip strength     (Blank rows = not tested)   [facility] Adult PT Treatment:                                                 DATE: 07/19/32 Therapeutic Exercise:  Supine chin tuck 10 sec x 5 Prolonged snow angel ~ 2 min UE's ~ 75 deg  Standing  Chin tuck 5 sec x 5 Scap squeeze 5 sec x 5 Upper trap stretch 10 sec x 3 L's 2 sec x 5  W's with noodle 2-3 sec x 10  Doorway stretch 30 sec 3 positions x 2 Scap squeeze w/noodle red TB 3 sec x 10   W w/noodle red TB 3 sec x 10  Row green TB 3 sec x 10  Shoulder extension green TB 3 sec x 10  Manual Therapy: Supine STM ant/lat/post cervical musculature into upper traps PROM cervical spine into flexion and fexion with slight rotation  Neuromuscular re-ed: Working on posture and alignment  Therapeutic Activity: Sitting posture and alignment; using pillows to support UE's; lying down using towels to support cervical spine, pillows btn LE's and pillow to hug  Modalities: TENS Rt upper quarter x 15 min  Moist heat Rt shoulder girdle  Self Care: Sitting with noodle   DATE: 07/12/32 Therapeutic Exercise:  Supine chin tuck 10 sec x 5 Prolonged snow angel ~ 2  min UE's ~ 75 deg  Standing  Chin tuck 5 sec x 5 Scap squeeze 5 sec x 5 Upper trap stretch 10 sec x 3 L's 2 sec x 5  Doorway stretch 30 sec 3 positions x 1  Manual Therapy: Supine STM ant/lat/post cervical musculature into upper traps Neuromuscular re-ed: Working on posture and alignment  Therapeutic Activity: Sitting posture and alignment; using pillows to support UE's; lying down using towels to support cervical spine,  pillows btn LE's and pillow to hug  Modalities: TENS Rt upper quarter x 15 min  Moist heat Rt shoulder girdle  Self Care: Sitting with noodle    PATIENT EDUCATION:  Education details: POC; HEP  Person educated: Patient Education method: Consulting civil engineer, Demonstration, Tactile cues, Verbal cues, and Handouts Education comprehension: verbalized understanding, returned demonstration, verbal cues required, tactile cues required, and needs further education  HOME EXERCISE PROGRAM: Access Code: [email or web address] URL: [email or web address] Date: 07/20/2022 Prepared by: Gillermo Murdoch  Exercises - Supine Cervical Retraction with Towel  - 2 x daily - 7 x weekly - 1 sets - 5-10 reps - 10 sec  hold - Supine Chest Stretch on Foam Roll  - 2 x daily - 7 x weekly - 1 sets - 1 reps - 2-5 min  sec  hold - Seated Cervical Retraction  - 3 x daily - 7 x weekly - 1 sets - 10 reps - Standing Scapular Retraction  - 3 x daily - 7 x weekly - 1 sets - 10 reps - 10 hold - Shoulder External Rotation and Scapular Retraction  - 3 x daily - 7 x weekly - 1 sets - 10 reps -   hold - Standing Upper Trapezius Stretch  - 2 x daily - 7 x weekly - 1 sets - 3 reps - 10 sec  hold - Doorway Pec Stretch at 60 Degrees Abduction  - 3 x daily - 7 x weekly - 1 sets - 3 reps - Doorway Pec Stretch at 90 Degrees Abduction  - 3 x daily - 7 x weekly - 1 sets - 3 reps - 30 seconds  hold - Doorway Pec Stretch at 120 Degrees Abduction  - 3 x daily - 7 x weekly - 1 sets - 3 reps - 30 second hold  hold - Shoulder External Rotation in 45 Degrees Abduction  - 2 x daily - 7 x weekly - 1-2 sets - 10 reps - 3 sec  hold - Shoulder External Rotation and Scapular Retraction with Resistance  - 2 x daily - 7 x weekly - 1 sets - 10 reps - 3-5 sec  hold - Shoulder W - External Rotation with Resistance  - 2 x daily - 7 x weekly - 1-2 sets - 10 reps - 3 sec  hold - Shoulder extension with resistance - Neutral  - 1 x daily - 7 x weekly - 1-2 sets - 10 reps  - 3-5 sec  hold  Patient Education - Biomedical scientist - Office Posture - TENS Unit - Trigger Point Dry Needling  ASSESSMENT:  CLINICAL IMPRESSION: Patient reports improvement with exercise and postural awareness following physical therapy evaluation and treatment. Reviewed exercises and added light resistive exercises for home    OBJECTIVE IMPAIRMENTS: Rt cervical radiculopathy;  poor posture and alignment; limited cervical land thoracic mobility/ROM; muscular tightness to palpation; pain with functional activities decreased activity tolerance, decreased mobility, decreased ROM, decreased strength, hypomobility, impaired flexibility, improper body mechanics, postural dysfunction, and pain.   GOALS:  Goals reviewed with patient? Yes  SHORT TERM GOALS: Target date: 08/10/2022  Independent in initial HEP  Baseline:  Goal status: INITIAL  2.  Patient verbalizes and demonstrates improved posture and alignment with improved transitional and positional movements  Baseline:  Goal status: INITIAL    LONG TERM GOALS: Target date: 09/07/2022   Improve cervical ROM Lt rotation and lateral flexion equal to Rt  Baseline:  Goal status: INITIAL  2.  5/5 strength bilat UE's Baseline:  Goal status: INITIAL  3.  Improve posture and alignment with patient to demonstrate improved upright posture with posterior shoulder girdle engaged  Baseline:  Goal status: INITIAL  4.  Patient reports 75-100% decrease in pain  Baseline:  Goal status: INITIAL  5.  Independent in HEP  Baseline:  Goal status: INITIAL  6.  Improve functional limitation score to 65  Baseline:  Goal status: INITIAL   PLAN:  PT FREQUENCY: 2x/week  PT DURATION: 8 weeks  PLANNED INTERVENTIONS: Therapeutic exercises, Therapeutic activity, Neuromuscular re-education, Patient/Family education, Self Care, Joint mobilization, Aquatic Therapy, Dry Needling, Electrical stimulation, Spinal mobilization,  Cryotherapy, Moist heat, Taping, Ultrasound, Ionotophoresis '4mg'$ /ml Dexamethasone, Manual therapy, and Re-evaluation  PLAN FOR NEXT SESSION: review and progress exercise program; continue postural correction and education; manual work, DN, modalities as indicate    [employer], PT 07/20/2022, 2:54 PM

## 2022-07-20 NOTE — Assessment & Plan Note (Signed)
Headaches are resolved at this point.  She is doing physical therapy for her neck as well plan to continue this.

## 2022-07-20 NOTE — Progress Notes (Signed)
Amanda Schmitt - 77 y.o. female MRN [id number]  Date of birth: [birth year]  Subjective Chief Complaint  Patient presents with   Follow-up    HPI Amanda Schmitt is a 77 year old female here today for follow-up visit.  Reports she is doing pretty well overall.  Her headaches are much better.  She has been doing physical therapy as well for her neck pain.  This seems to have helped her headaches some as well.  She did not have to try Nurtec.  She has cut back on her wine intake.  Reports she is sleeping better since doing this.  She would like to reduce her Ambien to 6.25 mg strength.  She would like to have her labs updated today.  ROS:  A comprehensive ROS was completed and negative except as noted per HPI  Allergies  Allergen Reactions   Moxifloxacin Other (See Comments)    Flu like symptoms   Nitrofuran Derivatives    Codeine Nausea Only   Tramadol Nausea Only    Past Medical History:  Diagnosis Date   Depression     Past Surgical History:  Procedure Laterality Date   ABDOMINAL HYSTERECTOMY     ABDOMINAL HYSTERECTOMY     Bowel blockage     BOWEL RESECTION     CESAREAN SECTION      Social History   Socioeconomic History   Marital status: Widowed    Spouse name: Not on file   Number of children: 2   Years of education: 16   Highest education level: Bachelor's degree (e.g., BA, AB, BS)  Occupational History   Occupation: Retired    Comment: Pharmacist, hospital  Tobacco Use   Smoking status: Former    Packs/day: 0.50    Years: 26.00    Total pack years: 13.00    Types: Cigarettes    Quit date: 1999    Years since quitting: 25.2   Smokeless tobacco: Never  Substance and Sexual Activity   Alcohol use: Yes    Alcohol/week: 2.0 standard drinks of alcohol    Types: 2 Glasses of wine per week    Comment: per night   Drug use: No   Sexual activity: Not Currently  Other Topics Concern   Not on file  Social History Narrative   Lives alone. Volunteers at [employer].  Enjoys doing crossword puzzles, reading and painting furniture.   Social Determinants of Health   Financial Resource Strain: Low Risk  (05/23/2020)   Overall Financial Resource Strain (CARDIA)    Difficulty of Paying Living Expenses: Not hard at all  Food Insecurity: No Food Insecurity (11/18/2021)   Hunger Vital Sign    Worried About Running Out of Food in the Last Year: Never true    Ran Out of Food in the Last Year: Never true  Transportation Needs: No Transportation Needs (11/18/2021)   PRAPARE - Hydrologist (Medical): No    Lack of Transportation (Non-Medical): No  Physical Activity: Inactive (11/18/2021)   Exercise Vital Sign    Days of Exercise per Week: 0 days    Minutes of Exercise per Session: 0 min  Stress: No Stress Concern Present (11/18/2021)   [place]    Feeling of Stress : Not at all  Social Connections: Socially Isolated (11/18/2021)   Social Connection and Isolation Panel [NHANES]    Frequency of Communication with Friends and Family: More than three times a week    Frequency  of Social Gatherings with Friends and Family: Three times a week    Attends Religious Services: Never    Active Member of Clubs or Organizations: No    Attends Archivist Meetings: Never    Marital Status: Widowed    Family History  Problem Relation Age of Onset   Cancer Father        unorgin    Health Maintenance  Topic Date Due   INFLUENZA VACCINE  08/08/2022 (Originally 12/08/2021)   MAMMOGRAM  11/19/2022 (Originally 08/22/2022)   DEXA SCAN  11/19/2022 (Originally 05/24/2021)   Hepatitis C Screening  11/19/2022 (Originally 06/24/1963)   Medicare Annual Wellness (AWV)  11/19/2022   DTaP/Tdap/Td (4 - Td or Tdap) 08/24/2026   Pneumonia Vaccine 81+ Years old  Completed   COVID-19 Vaccine  Completed   Zoster Vaccines- Shingrix  Completed   HPV VACCINES  Aged Out   COLONOSCOPY (Pts  45-28yr Insurance coverage will need to be confirmed)  Discontinued     ----------------------------------------------------------------------------------------------------------------------------------------------------------------------------------------------------------------- Physical Exam BP 134/77 (BP Location: Left Arm, Patient Position: Sitting, Cuff Size: Small)   Pulse 74   Ht 5' (1.524 m)   Wt 122 lb (55.3 kg)   SpO2 98%   BMI 23.83 kg/m   Physical Exam Constitutional:      Appearance: Normal appearance.  HENT:     Head: Normocephalic and atraumatic.  Eyes:     General: No scleral icterus. Cardiovascular:     Rate and Rhythm: Normal rate and regular rhythm.  Pulmonary:     Effort: Pulmonary effort is normal.     Breath sounds: Normal breath sounds.  Musculoskeletal:     Cervical back: Neck supple.  Neurological:     General: No focal deficit present.     Mental Status: She is alert.  Psychiatric:        Mood and Affect: Mood normal.        Behavior: Behavior normal.     ------------------------------------------------------------------------------------------------------------------------------------------------------------------------------------------------------------------- Assessment and Plan  Headache Headaches are resolved at this point.  She is doing physical therapy for her neck as well plan to continue this.  GAD (generalized anxiety disorder) Doing well on Lexapro at current strength.  Will plan to continue.  B12 deficiency Updated B12 levels.  Checking iron panel as well.  Osteopenia of multiple sites Encouraged weightbearing exercise.  Updating vitamin D levels.  Primary insomnia Reducing Ambien 6.25 mg once daily.   Meds ordered this encounter  Medications   zolpidem (AMBIEN CR) 6.25 MG CR tablet    Sig: Take 1 tablet (6.25 mg total) by mouth at bedtime as needed for sleep.    Dispense:  90 tablet    Refill:  1    Return in  about 6 months (around 01/20/2023) for Insomnia/HLD.    This visit occurred during the SARS-CoV-2 public health emergency.  Safety protocols were in place, including screening questions prior to the visit, additional usage of staff PPE, and extensive cleaning of exam room while observing appropriate contact time as indicated for disinfecting solutions.

## 2022-07-22 ENCOUNTER — Encounter: Payer: Self-pay | Admitting: Rehabilitative and Restorative Service Providers"

## 2022-07-22 ENCOUNTER — Ambulatory Visit: Payer: Medicare PPO | Admitting: Rehabilitative and Restorative Service Providers"

## 2022-07-22 DIAGNOSIS — M542 Cervicalgia: Secondary | ICD-10-CM

## 2022-07-22 DIAGNOSIS — R29898 Other symptoms and signs involving the musculoskeletal system: Secondary | ICD-10-CM

## 2022-07-22 DIAGNOSIS — R293 Abnormal posture: Secondary | ICD-10-CM | POA: Diagnosis not present

## 2022-07-22 DIAGNOSIS — M6281 Muscle weakness (generalized): Secondary | ICD-10-CM

## 2022-07-22 DIAGNOSIS — M5412 Radiculopathy, cervical region: Secondary | ICD-10-CM | POA: Diagnosis not present

## 2022-07-22 NOTE — Therapy (Signed)
OUTPATIENT PHYSICAL THERAPY CERVICAL TREATMENT   Patient Name: Amanda Schmitt MRN: [id number] DOB:1945/12/30, 77 y.o., female Today's Date: 07/22/2022  END OF SESSION:  PT End of Session - 07/22/22 1449     Visit Number 3    Number of Visits 16    Date for PT Re-Evaluation 09/07/22    Authorization Type state health humana plan    PT Start Time 1446    PT Stop Time 1534    PT Time Calculation (min) 48 min    Activity Tolerance Patient tolerated treatment well             Past Medical History:  Diagnosis Date   Depression    Past Surgical History:  Procedure Laterality Date   ABDOMINAL HYSTERECTOMY     ABDOMINAL HYSTERECTOMY     Bowel blockage     BOWEL RESECTION     CESAREAN SECTION     Patient Active Problem List   Diagnosis Date Noted   Right cervical radiculopathy 07/02/2022   Headache 06/14/2022   Neck swelling 03/01/2022   Elevated blood-pressure reading without diagnosis of hypertension 01/11/2022   B12 deficiency 01/11/2022   Well adult exam 07/08/2021   Recurrent UTI 09/23/2020   Lumbar spondylosis 06/30/2020   Trochanteric bursitis of both hips 04/15/2020   GAD (generalized anxiety disorder) 10/16/2019   Trigeminal neuralgia 09/27/2019   Palpitations 09/27/2019   Epigastric pain 09/27/2019   Primary insomnia 06/22/2016   Osteopenia of multiple sites 08/11/2015   Mixed hyperlipidemia 08/11/2015   Major depressive disorder in full remission ([facility]) 08/11/2015   Senile nuclear sclerosis 07/25/2014    PCP: Dr Luetta Nutting  REFERRING PROVIDER: Dr Aundria Mems   REFERRING DIAG: Rt cervical radiculopathy  THERAPY DIAG:  Cervical pain  Other symptoms and signs involving the musculoskeletal system  Abnormal posture  Muscle weakness (generalized)  Rationale for Evaluation and Treatment: Rehabilitation  ONSET DATE: 06/10/22  SUBJECTIVE:                                                                                                                                                                                                          SUBJECTIVE STATEMENT: Patient reports that she has had some continued tightness in the lower part of neck and upper back on Rt. She has a headache today. Did not sleep well last night.   PERTINENT HISTORY:  increased pain in the Rt cervical and upper trap area in the past monthBursitis in bilat hips; LBP; migraines primarily Rt side  PAIN:  Are you having pain? Yes:  NPRS scale: 4/10 Pain location: Rt neck and upper trap  Pain description: dull, aching, sharp with turning head in certain way Aggravating factors: sitting to long; lying wrong at night  Relieving factors: icy hot; heat  PRECAUTIONS: None  WEIGHT BEARING RESTRICTIONS: No  FALLS:  Has patient fallen in last 6 months? 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 Degenerative disc disease and uncovertebral degenerative changes   PATIENT SURVEYS:  FOTO 45; goal 65  POSTURE: Patient presents with head forward posture with increased thoracic kyphosis; shoulders rounded and elevated; scapulae abducted and rotated along the thoracic spine; head of the humerus anterior in orientation.  PALPATION: Muscular tightness Rt > Lt ant/lat/post cervical musculature; pecs; upper trap; thoracic paraspinals    CERVICAL ROM:   Active ROM A/PROM (deg) eval  Flexion 52  Extension 48  Right lateral flexion 36  Left lateral flexion 27 tight  Right rotation 67  Left rotation 59 tight    (Blank rows = not tested)  UPPER EXTREMITY ROM: WNL's bilat    UPPER EXTREMITY MMT:  MMT Right eval Left eval   Shoulder flexion 4+ 4+  Shoulder extension    Shoulder abduction    Shoulder adduction    Shoulder extension    Shoulder internal rotation    Shoulder external rotation 4- 4  Middle trapezius 4- 4  Lower trapezius    Elbow flexion    Elbow extension    Wrist flexion    Wrist extension    Wrist ulnar deviation    Wrist radial deviation    Wrist pronation    Wrist supination    Grip strength     (Blank rows = not tested)   [facility] Adult PT Treatment:                                                 DATE: 07/21/32 Therapeutic Exercise:  Supine chin tuck 10 sec x 5 Prolonged snow angel ~ 2 min UE's ~ 75 deg  Standing  UBE L3 3 x 4 min alt fwd/back Chin tuck 5 sec x 5 Scap squeeze 5 sec x 5 Upper trap stretch 10 sec x 3 L's 2 sec x 5  W's with noodle 2-3 sec x 10  Doorway stretch 30 sec 3 positions x 2 Scap squeeze w/noodle red TB 3 sec x 10   W w/noodle red TB 3 sec x 10  Row green TB 3 sec x 10  Shoulder extension green TB 3 sec x 10  Manual Therapy: Supine STM ant/lat/post cervical musculature into upper traps PROM cervical spine into flexion and fexion with slight rotation  Trigger Point Dry-Needling  Treatment instructions: Expect mild to moderate muscle soreness. S/S of pneumothorax if dry needled over a lung field, and to seek immediate medical attention should they occur. Patient verbalized understanding of these instructions and education.  Patient Consent Given: Yes Education handout provided: Previously provided Muscles treated: upper trap Rt  Electrical stimulation  performed: No Parameters:  Treatment response/outcome: decreased palpable tightness   Neuromuscular re-ed: Working on posture and alignment  Therapeutic Activity: Sitting posture and alignment; using pillows to support UE's; lying down using towels to support cervical spine, pillows btn LE's and pillow to hug  Modalities: Moist heat Rt shoulder girdle  Self Care: Sitting with noodle  DATE:  07/19/32 Therapeutic Exercise:  Supine chin tuck 10 sec x 5 Prolonged snow angel ~ 2 min UE's ~ 75 deg  Standing  Chin tuck 5 sec x 5 Scap squeeze 5 sec x 5 Upper trap stretch 10 sec x 3 L's 2 sec x 5  W's with noodle 2-3 sec x 10  Doorway stretch 30 sec 3 positions x 2 Scap squeeze w/noodle red TB 3 sec x 10   W w/noodle red TB 3 sec x 10  Row green TB 3 sec x 10  Shoulder extension green TB 3 sec x 10  Manual Therapy: Supine STM ant/lat/post cervical musculature into upper traps PROM cervical spine into flexion and fexion with slight rotation  Neuromuscular re-ed: Working on posture and alignment  Therapeutic Activity: Sitting posture and alignment; using pillows to support UE's; lying down using towels to support cervical spine, pillows btn LE's and pillow to hug  Modalities: TENS Rt upper quarter x 15 min  Moist heat Rt shoulder girdle  Self Care: Sitting with noodle   PATIENT EDUCATION:  Education details: POC; HEP  Person educated: Patient Education method: Consulting civil engineer, Demonstration, Tactile cues, Verbal cues, and Handouts Education comprehension: verbalized understanding, returned demonstration, verbal cues required, tactile cues required, and needs further education  HOME EXERCISE PROGRAM: Access Code: [email or web address] URL: [email or web address] Date: 07/20/2022 Prepared by: Gillermo Murdoch  Exercises - Supine Cervical Retraction with Towel  - 2 x daily - 7 x weekly - 1 sets - 5-10 reps - 10 sec  hold - Supine Chest Stretch on Foam Roll  - 2 x daily - 7 x weekly - 1 sets - 1 reps - 2-5 min  sec  hold - Seated Cervical Retraction  - 3 x daily - 7 x weekly - 1 sets - 10 reps - Standing Scapular Retraction  - 3 x daily - 7 x weekly - 1 sets - 10 reps - 10 hold - Shoulder External Rotation and Scapular Retraction  - 3 x daily - 7 x weekly - 1 sets - 10 reps -   hold - Standing Upper Trapezius Stretch  - 2 x daily - 7 x weekly - 1 sets - 3 reps - 10 sec  hold -  Doorway Pec Stretch at 60 Degrees Abduction  - 3 x daily - 7 x weekly - 1 sets - 3 reps - Doorway Pec Stretch at 90 Degrees Abduction  - 3 x daily - 7 x weekly - 1 sets - 3 reps - 30 seconds  hold - Doorway Pec Stretch at 120 Degrees Abduction  - 3 x daily - 7 x weekly - 1 sets - 3 reps - 30 second hold  hold - Shoulder External Rotation in 45 Degrees Abduction  - 2 x daily - 7 x weekly - 1-2 sets - 10 reps - 3 sec  hold - Shoulder External Rotation and Scapular Retraction with Resistance  - 2 x daily - 7 x weekly - 1 sets - 10 reps - 3-5 sec  hold - Shoulder W - External Rotation with Resistance  - 2 x daily - 7 x weekly - 1-2 sets -  10 reps - 3 sec  hold - Shoulder extension with resistance - Neutral  - 1 x daily - 7 x weekly - 1-2 sets - 10 reps - 3-5 sec  hold  Patient Education - Biomedical scientist - Office Posture - TENS Unit - Trigger Point Dry Needling  ASSESSMENT:  CLINICAL IMPRESSION: Patient reports improvement with exercise and postural awareness. Trial of Dn for Rt upper trap tolerated well.  Reviewed exercises. Will add row and shd extension exercises for home at next visit. TENS as needed    OBJECTIVE IMPAIRMENTS: Rt cervical radiculopathy;  poor posture and alignment; limited cervical land thoracic mobility/ROM; muscular tightness to palpation; pain with functional activities decreased activity tolerance, decreased mobility, decreased ROM, decreased strength, hypomobility, impaired flexibility, improper body mechanics, postural dysfunction, and pain.   GOALS: Goals reviewed with patient? Yes  SHORT TERM GOALS: Target date: 08/10/2022  Independent in initial HEP  Baseline:  Goal status: INITIAL  2.  Patient verbalizes and demonstrates improved posture and alignment with improved transitional and positional movements  Baseline:  Goal status: INITIAL    LONG TERM GOALS: Target date: 09/07/2022   Improve cervical ROM Lt rotation and lateral flexion equal to  Rt  Baseline:  Goal status: INITIAL  2.  5/5 strength bilat UE's Baseline:  Goal status: INITIAL  3.  Improve posture and alignment with patient to demonstrate improved upright posture with posterior shoulder girdle engaged  Baseline:  Goal status: INITIAL  4.  Patient reports 75-100% decrease in pain  Baseline:  Goal status: INITIAL  5.  Independent in HEP  Baseline:  Goal status: INITIAL  6.  Improve functional limitation score to 65  Baseline:  Goal status: INITIAL   PLAN:  PT FREQUENCY: 2x/week  PT DURATION: 8 weeks  PLANNED INTERVENTIONS: Therapeutic exercises, Therapeutic activity, Neuromuscular re-education, Patient/Family education, Self Care, Joint mobilization, Aquatic Therapy, Dry Needling, Electrical stimulation, Spinal mobilization, Cryotherapy, Moist heat, Taping, Ultrasound, Ionotophoresis '4mg'$ /ml Dexamethasone, Manual therapy, and Re-evaluation  PLAN FOR NEXT SESSION: review and progress exercise program; continue postural correction and education; manual work, DN, modalities as indicate    [employer], PT 07/22/2022, 2:50 PM

## 2022-07-23 DIAGNOSIS — M8589 Other specified disorders of bone density and structure, multiple sites: Secondary | ICD-10-CM | POA: Diagnosis not present

## 2022-07-23 DIAGNOSIS — E782 Mixed hyperlipidemia: Secondary | ICD-10-CM | POA: Diagnosis not present

## 2022-07-23 DIAGNOSIS — R7989 Other specified abnormal findings of blood chemistry: Secondary | ICD-10-CM | POA: Diagnosis not present

## 2022-07-23 DIAGNOSIS — R030 Elevated blood-pressure reading, without diagnosis of hypertension: Secondary | ICD-10-CM | POA: Diagnosis not present

## 2022-07-23 DIAGNOSIS — E538 Deficiency of other specified B group vitamins: Secondary | ICD-10-CM | POA: Diagnosis not present

## 2022-07-24 LAB — COMPLETE METABOLIC PANEL WITH GFR
AG Ratio: 1.8 (calc) (ref 1.0–2.5)
ALT: 14 U/L (ref 6–29)
AST: 22 U/L (ref 10–35)
Albumin: 3.9 g/dL (ref 3.6–5.1)
Alkaline phosphatase (APISO): 57 U/L (ref 37–153)
BUN: 10 mg/dL (ref 7–25)
CO2: 28 mmol/L (ref 20–32)
Calcium: 8.7 mg/dL (ref 8.6–10.4)
Chloride: 104 mmol/L (ref 98–110)
Creat: 0.68 mg/dL (ref 0.60–1.00)
Globulin: 2.2 g/dL (calc) (ref 1.9–3.7)
Glucose, Bld: 82 mg/dL (ref 65–99)
Potassium: 4 mmol/L (ref 3.5–5.3)
Sodium: 140 mmol/L (ref 135–146)
Total Bilirubin: 0.7 mg/dL (ref 0.2–1.2)
Total Protein: 6.1 g/dL (ref 6.1–8.1)
eGFR: 90 mL/min/{1.73_m2} (ref >=60)

## 2022-07-24 LAB — CBC WITH DIFFERENTIAL/PLATELET
Absolute Monocytes: 510 cells/uL (ref 200–950)
Basophils Absolute: 41 cells/uL (ref 0–200)
Basophils Relative: 0.8 %
Eosinophils Absolute: 122 cells/uL (ref 15–500)
Eosinophils Relative: 2.4 %
HCT: 36.1 % (ref 35.0–45.0)
Hemoglobin: 12.3 g/dL (ref 11.7–15.5)
Lymphs Abs: 2132 cells/uL (ref 850–3900)
MCH: 34.2 pg — ABNORMAL HIGH (ref 27.0–33.0)
MCHC: 34.1 g/dL (ref 32.0–36.0)
MCV: 100.3 fL — ABNORMAL HIGH (ref 80.0–100.0)
MPV: 10.7 fL (ref 7.5–12.5)
Monocytes Relative: 10 %
Neutro Abs: 2295 cells/uL (ref 1500–7800)
Neutrophils Relative %: 45 %
Platelets: 268 10*3/uL (ref 140–400)
RBC: 3.6 10*6/uL — ABNORMAL LOW (ref 3.80–5.10)
RDW: 11.6 % (ref 11.0–15.0)
Total Lymphocyte: 41.8 %
WBC: 5.1 10*3/uL (ref 3.8–10.8)

## 2022-07-24 LAB — LIPID PANEL W/REFLEX DIRECT LDL
Cholesterol: 236 mg/dL — ABNORMAL HIGH (ref <200)
HDL: 103 mg/dL (ref >=50)
LDL Cholesterol (Calc): 118 mg/dL (calc) — ABNORMAL HIGH
Non-HDL Cholesterol (Calc): 133 mg/dL (calc) — ABNORMAL HIGH (ref <130)
Total CHOL/HDL Ratio: 2.3 (calc) (ref <5.0)
Triglycerides: 62 mg/dL (ref <150)

## 2022-07-24 LAB — TSH: TSH: 3.48 mIU/L (ref 0.40–4.50)

## 2022-07-24 LAB — IRON,TIBC AND FERRITIN PANEL
%SAT: 40 % (calc) (ref 16–45)
Ferritin: 94 ng/mL (ref 16–288)
Iron: 107 ug/dL (ref 45–160)
TIBC: 267 mcg/dL (calc) (ref 250–450)

## 2022-07-24 LAB — VITAMIN D 25 HYDROXY (VIT D DEFICIENCY, FRACTURES): Vit D, 25-Hydroxy: 38 ng/mL (ref 30–100)

## 2022-07-24 LAB — VITAMIN B12: Vitamin B-12: 788 pg/mL (ref 200–1100)

## 2022-07-27 ENCOUNTER — Encounter: Payer: Medicare PPO | Admitting: Rehabilitative and Restorative Service Providers"

## 2022-07-29 ENCOUNTER — Ambulatory Visit: Payer: Medicare PPO | Admitting: Rehabilitative and Restorative Service Providers"

## 2022-07-29 ENCOUNTER — Encounter: Payer: Self-pay | Admitting: Rehabilitative and Restorative Service Providers"

## 2022-07-29 DIAGNOSIS — M5412 Radiculopathy, cervical region: Secondary | ICD-10-CM | POA: Diagnosis not present

## 2022-07-29 DIAGNOSIS — M6281 Muscle weakness (generalized): Secondary | ICD-10-CM

## 2022-07-29 DIAGNOSIS — M542 Cervicalgia: Secondary | ICD-10-CM | POA: Diagnosis not present

## 2022-07-29 DIAGNOSIS — R29898 Other symptoms and signs involving the musculoskeletal system: Secondary | ICD-10-CM | POA: Diagnosis not present

## 2022-07-29 DIAGNOSIS — R293 Abnormal posture: Secondary | ICD-10-CM

## 2022-07-29 NOTE — Therapy (Addendum)
OUTPATIENT PHYSICAL THERAPY CERVICAL TREATMENT AND DISCHARGE SUMMARY   PHYSICAL THERAPY DISCHARGE SUMMARY  Visits from Start of Care: 4  Current functional level related to goals / functional outcomes: See progress note for discharge status    Remaining deficits: Unknown    Education / Equipment: HEP   Patient agrees to discharge. Patient goals were partially met. Patient is being discharged due to being pleased with the current functional level.  Amanda Schmitt PT, MPH 09/01/22 4:33 PM   Patient Name: Amanda Schmitt MRN: [id number] DOB:10-28-1945, 77 y.o., female Today's Date: 07/29/2022  END OF SESSION:  PT End of Session - 07/29/22 1451     Visit Number 4    Number of Visits 16    Date for PT Re-Evaluation 09/07/22    Authorization Type state health humana plan    PT Start Time 1446    PT Stop Time 1535    PT Time Calculation (min) 49 min    Activity Tolerance Patient tolerated treatment well             Past Medical History:  Diagnosis Date   Depression    Past Surgical History:  Procedure Laterality Date   ABDOMINAL HYSTERECTOMY     ABDOMINAL HYSTERECTOMY     Bowel blockage     BOWEL RESECTION     CESAREAN SECTION     Patient Active Problem List   Diagnosis Date Noted   Right cervical radiculopathy 07/02/2022   Headache 06/14/2022   Neck swelling 03/01/2022   Elevated blood-pressure reading without diagnosis of hypertension 01/11/2022   B12 deficiency 01/11/2022   Well adult exam 07/08/2021   Recurrent UTI 09/23/2020   Lumbar spondylosis 06/30/2020   Trochanteric bursitis of both hips 04/15/2020   GAD (generalized anxiety disorder) 10/16/2019   Trigeminal neuralgia 09/27/2019   Palpitations 09/27/2019   Epigastric pain 09/27/2019   Primary insomnia 06/22/2016   Osteopenia of multiple sites 08/11/2015   Mixed hyperlipidemia 08/11/2015   Major depressive disorder in full remission ([facility]) 08/11/2015   Senile nuclear sclerosis 07/25/2014     PCP: Dr Everrett Coombe  REFERRING PROVIDER: Dr Rodney Langton   REFERRING DIAG: Rt cervical radiculopathy  THERAPY DIAG:  Cervical pain  Other symptoms and signs involving the musculoskeletal system  Abnormal posture  Muscle weakness (generalized)  Rationale for Evaluation and Treatment: Rehabilitation  ONSET DATE: 06/10/22  SUBJECTIVE:  SUBJECTIVE STATEMENT: Patient reports that she has had some decreased tightness in the Rt shoulder area following dry needling but then tightneed up again Saturday. Reports tightness is better upper thoracic area but tightness continues in the upper trap and C/T junction.  PERTINENT HISTORY:  increased pain in the Rt cervical and upper trap area in the past monthBursitis in bilat hips; LBP; migraines primarily Rt side  PAIN:  Are you having pain? Yes: NPRS scale: 2/10 Pain location: Rt neck and upper trap  Pain description: dull, aching, sharp with turning head in certain way Aggravating factors: sitting to long; lying wrong at night  Relieving factors: icy hot; heat  PRECAUTIONS: None  WEIGHT BEARING RESTRICTIONS: No  FALLS:  Has patient fallen in last 6 months? 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 Degenerative disc disease and uncovertebral degenerative changes   PATIENT SURVEYS:  FOTO 45; goal 65  POSTURE: Patient presents with head forward posture with increased thoracic kyphosis;  shoulders rounded and elevated; scapulae abducted and rotated along the thoracic spine; head of the humerus anterior in orientation.  PALPATION: Muscular tightness Rt > Lt ant/lat/post cervical musculature; pecs; upper trap; thoracic paraspinals    CERVICAL ROM:   Active ROM A/PROM (deg) eval  Flexion 52  Extension 48  Right lateral flexion 36  Left lateral flexion 27 tight  Right rotation 67  Left rotation 59 tight    (Blank rows = not tested)  UPPER EXTREMITY ROM: WNL's bilat    UPPER EXTREMITY MMT:  MMT Right eval Left eval  Shoulder flexion 4+ 4+  Shoulder extension    Shoulder abduction    Shoulder adduction    Shoulder extension    Shoulder internal rotation    Shoulder external rotation 4- 4  Middle trapezius 4- 4  Lower trapezius    Elbow flexion    Elbow extension    Wrist flexion    Wrist extension    Wrist ulnar deviation    Wrist radial deviation    Wrist pronation    Wrist supination    Grip strength     (Blank rows = not tested)   [facility] Adult PT Treatment:                                                 DATE: 07/29/75 Therapeutic Exercise:  Standing  UBE L4 x 4 min alt fwd/back Chin tuck 5 sec x 5 Scap squeeze 5 sec x 5 Upper trap stretch 10 sec x 3 L's 2 sec x 5  W's with noodle 2-3 sec x 10  Doorway stretch 30 sec 3 positions x 2 Scap squeeze w/noodle red TB 3 sec x 10   W w/noodle red TB 3 sec x 10  Row green TB 3 sec x 10  Shoulder extension green TB 3 sec x 10  Manual Therapy: Supine STM ant/lat/post cervical musculature into upper traps PROM cervical spine into flexion and fexion with slight rotation  Trigger Point Dry-Needling  Treatment instructions: Expect mild to moderate muscle soreness. S/S of pneumothorax if dry needled over a lung field, and to seek immediate medical attention should they occur. Patient verbalized understanding of these instructions and education.  Patient Consent Given: Yes Education handout provided:  Previously provided Muscles treated: upper trap Rt  Electrical stimulation performed: No Parameters:  Treatment response/outcome: decreased palpable tightness   Neuromuscular re-ed: Working on posture and alignment  Therapeutic Activity: Sitting posture and alignment; using pillows to support UE's; lying down using towels to support cervical spine, pillows btn LE's and pillow to hug  Modalities: Moist heat Rt shoulder girdle  Self Care: Sitting with noodle DATE: 07/22/75 Therapeutic Exercise: Supine  Supine chin tuck 10 sec x 5 Prolonged snow angel with coregeous ball T-spine~ 2 min UE's ~ 75 deg  Sitting  Thoracic extension sitting coregeous ball T-spine  Standing  UBE L3 3 x 4 min alt fwd/back Chin tuck 5 sec x 5 Scap squeeze 5 sec x 5 Upper trap stretch 10 sec x 3 L's 2 sec x 5  W's with noodle 2-3 sec x 10  Doorway stretch 30 sec 3 positions x 2 Scap squeeze w/noodle red TB 3 sec x 10   W w/noodle red TB 3 sec x 10  Row green TB 3 sec x 10  Shoulder extension green TB 3 sec x 10  Manual Therapy: Supine STM ant/lat/post cervical musculature into upper traps PROM cervical spine into flexion and fexion with slight rotation  Trigger Point Dry-Needling  Treatment instructions: Expect mild to moderate muscle soreness. S/S of pneumothorax if dry needled over a lung field, and to seek immediate medical attention should they occur. Patient verbalized understanding of these instructions and education.  Patient Consent Given: Yes Education handout provided: Previously provided Muscles treated: upper trap, levator Rt  Electrical stimulation performed: Yes Parameters: mAmp current; intensity to pt tolerance  Treatment response/outcome: decreased palpable tightness   Neuromuscular re-ed: Working on posture and alignment  Therapeutic Activity: Sitting posture and alignment; using pillows to support UE's; lying down using towels to support cervical spine, pillows btn LE's and  pillow to hug  Modalities: Moist heat Rt shoulder girdle  Self Care: Myofacial ball release Rt posterior shoulder girdle standing   PATIENT EDUCATION:  Education details: POC; HEP  Person educated: Patient Education method: Programmer, multimedia, Demonstration, Actor cues, Verbal cues, and Handouts Education comprehension: verbalized understanding, returned demonstration, verbal cues required, tactile cues required, and needs further education  HOME EXERCISE PROGRAM: Access Code: [email or web address] URL: [email or web address] Date: 07/20/2022 Prepared by: Corlis Leak  Exercises - Supine Cervical Retraction with Towel  - 2 x daily - 7 x weekly - 1 sets - 5-10 reps - 10 sec  hold - Supine Chest Stretch on Foam Roll  - 2 x daily - 7 x weekly - 1 sets - 1 reps - 2-5 min  sec  hold - Seated Cervical Retraction  - 3 x daily - 7 x weekly - 1 sets - 10 reps - Standing Scapular Retraction  - 3 x daily - 7 x weekly - 1 sets - 10 reps - 10 hold - Shoulder External Rotation and Scapular Retraction  - 3 x daily - 7 x weekly - 1 sets - 10 reps -   hold - Standing Upper Trapezius Stretch  - 2 x daily - 7 x weekly - 1 sets - 3 reps - 10 sec  hold - Doorway Pec Stretch at 60 Degrees Abduction  -  3 x daily - 7 x weekly - 1 sets - 3 reps - Doorway Pec Stretch at 90 Degrees Abduction  - 3 x daily - 7 x weekly - 1 sets - 3 reps - 30 seconds  hold - Doorway Pec Stretch at 120 Degrees Abduction  - 3 x daily - 7 x weekly - 1 sets - 3 reps - 30 second hold  hold - Shoulder External Rotation in 45 Degrees Abduction  - 2 x daily - 7 x weekly - 1-2 sets - 10 reps - 3 sec  hold - Shoulder External Rotation and Scapular Retraction with Resistance  - 2 x daily - 7 x weekly - 1 sets - 10 reps - 3-5 sec  hold - Shoulder W - External Rotation with Resistance  - 2 x daily - 7 x weekly - 1-2 sets - 10 reps - 3 sec  hold - Shoulder extension with resistance - Neutral  - 1 x daily - 7 x weekly - 1-2 sets - 10 reps - 3-5 sec   hold  Patient Education - Hospital doctor - Office Posture - TENS Unit - Trigger Point Dry Needling  ASSESSMENT:  CLINICAL IMPRESSION: Patient reports improvement with decreased tightness in the thoracic area but continued tightness in the upper thoracic/cervical area on the Rt. She is working on exercise and postural awareness. Good response to DN for Rt upper trap/levator.  Reviewed exercises. Will add row and shd extension exercises for home at next visit. TENS as needed    OBJECTIVE IMPAIRMENTS: Rt cervical radiculopathy;  poor posture and alignment; limited cervical land thoracic mobility/ROM; muscular tightness to palpation; pain with functional activities decreased activity tolerance, decreased mobility, decreased ROM, decreased strength, hypomobility, impaired flexibility, improper body mechanics, postural dysfunction, and pain.   GOALS: Goals reviewed with patient? Yes  SHORT TERM GOALS: Target date: 08/10/2022  Independent in initial HEP  Baseline:  Goal status: INITIAL  2.  Patient verbalizes and demonstrates improved posture and alignment with improved transitional and positional movements  Baseline:  Goal status: INITIAL    LONG TERM GOALS: Target date: 09/07/2022   Improve cervical ROM Lt rotation and lateral flexion equal to Rt  Baseline:  Goal status: INITIAL  2.  5/5 strength bilat UE's Baseline:  Goal status: INITIAL  3.  Improve posture and alignment with patient to demonstrate improved upright posture with posterior shoulder girdle engaged  Baseline:  Goal status: INITIAL  4.  Patient reports 75-100% decrease in pain  Baseline:  Goal status: INITIAL  5.  Independent in HEP  Baseline:  Goal status: INITIAL  6.  Improve functional limitation score to 65  Baseline:  Goal status: INITIAL   PLAN:  PT FREQUENCY: 2x/week  PT DURATION: 8 weeks  PLANNED INTERVENTIONS: Therapeutic exercises, Therapeutic activity, Neuromuscular  re-education, Patient/Family education, Self Care, Joint mobilization, Aquatic Therapy, Dry Needling, Electrical stimulation, Spinal mobilization, Cryotherapy, Moist heat, Taping, Ultrasound, Ionotophoresis 4mg /ml Dexamethasone, Manual therapy, and Re-evaluation  PLAN FOR NEXT SESSION: review and progress exercise program; continue postural correction and education; manual work, DN, modalities as indicate    [employer], PT 07/29/2022, 2:52 PM

## 2022-08-03 ENCOUNTER — Encounter: Payer: Medicare PPO | Admitting: Rehabilitative and Restorative Service Providers"

## 2022-08-05 ENCOUNTER — Encounter: Payer: Medicare PPO | Admitting: Rehabilitative and Restorative Service Providers"

## 2022-08-12 ENCOUNTER — Encounter: Payer: Medicare PPO | Admitting: Rehabilitative and Restorative Service Providers"

## 2022-08-13 ENCOUNTER — Ambulatory Visit: Payer: Medicare PPO | Admitting: Sports Medicine

## 2022-08-19 DIAGNOSIS — H18413 Arcus senilis, bilateral: Secondary | ICD-10-CM | POA: Diagnosis not present

## 2022-08-19 DIAGNOSIS — H26492 Other secondary cataract, left eye: Secondary | ICD-10-CM | POA: Diagnosis not present

## 2022-08-19 DIAGNOSIS — H02831 Dermatochalasis of right upper eyelid: Secondary | ICD-10-CM | POA: Diagnosis not present

## 2022-08-19 DIAGNOSIS — H02834 Dermatochalasis of left upper eyelid: Secondary | ICD-10-CM | POA: Diagnosis not present

## 2022-08-19 DIAGNOSIS — Z961 Presence of intraocular lens: Secondary | ICD-10-CM | POA: Diagnosis not present

## 2022-08-23 DIAGNOSIS — Z1231 Encounter for screening mammogram for malignant neoplasm of breast: Secondary | ICD-10-CM | POA: Diagnosis not present

## 2022-08-23 LAB — HM MAMMOGRAPHY

## 2022-09-17 DIAGNOSIS — S0990XA Unspecified injury of head, initial encounter: Secondary | ICD-10-CM | POA: Diagnosis not present

## 2022-09-17 DIAGNOSIS — S6991XA Unspecified injury of right wrist, hand and finger(s), initial encounter: Secondary | ICD-10-CM | POA: Diagnosis not present

## 2022-09-17 DIAGNOSIS — S199XXA Unspecified injury of neck, initial encounter: Secondary | ICD-10-CM | POA: Diagnosis not present

## 2022-09-17 DIAGNOSIS — S61411A Laceration without foreign body of right hand, initial encounter: Secondary | ICD-10-CM | POA: Diagnosis not present

## 2022-09-17 DIAGNOSIS — S299XXA Unspecified injury of thorax, initial encounter: Secondary | ICD-10-CM | POA: Diagnosis not present

## 2022-09-20 ENCOUNTER — Ambulatory Visit: Payer: Medicare PPO | Admitting: Sports Medicine

## 2022-09-23 ENCOUNTER — Other Ambulatory Visit (INDEPENDENT_AMBULATORY_CARE_PROVIDER_SITE_OTHER): Payer: Medicare PPO

## 2022-09-23 ENCOUNTER — Ambulatory Visit: Payer: Medicare PPO | Admitting: Sports Medicine

## 2022-09-23 DIAGNOSIS — M7062 Trochanteric bursitis, left hip: Secondary | ICD-10-CM | POA: Diagnosis not present

## 2022-09-23 DIAGNOSIS — M7061 Trochanteric bursitis, right hip: Secondary | ICD-10-CM

## 2022-09-23 NOTE — Progress Notes (Signed)
    Procedures performed today:    Procedure: Real-time Ultrasound Guided injection of the left greater trochanteric bursa Device: Samsung HS60  Verbal informed consent obtained.  Time-out conducted.  Noted no overlying erythema, induration, or other signs of local infection.  Skin prepped in a sterile fashion.  Local anesthesia: Topical Ethyl chloride.  With sterile technique and under real time ultrasound guidance: Noted normal-appearing hip abductor's, 1 cc Kenalog 40, 2 cc lidocaine, 2 cc bupivacaine injected easily Completed without difficulty  Advised to call if fevers/chills, erythema, induration, drainage, or persistent bleeding.  Images permanently stored and available for review in PACS.  Impression: Technically successful ultrasound guided injection.   Procedure: Real-time Ultrasound Guided injection of the right greater trochanteric bursa Device: Samsung HS60  Verbal informed consent obtained.  Time-out conducted.  Noted no overlying erythema, induration, or other signs of local infection.  Skin prepped in a sterile fashion.  Local anesthesia: Topical Ethyl chloride.  With sterile technique and under real time ultrasound guidance: Noted normal-appearing hip abductor's, 1 cc Kenalog 40, 2 cc lidocaine, 2 cc bupivacaine injected easily Completed without difficulty  Advised to call if fevers/chills, erythema, induration, drainage, or persistent bleeding.  Images permanently stored and available for review in PACS.  Impression: Technically successful ultrasound guided injection.  Independent interpretation of notes and tests performed by another provider:   None.  Brief History, Exam, Impression, and Recommendations:    Trochanteric bursitis of both hips Recurrence of bilateral lateral hip pain, last injected December 2023, repeat injections today, return to see me as needed, continue home physical therapy.    ____________________________________________ Amanda Schmitt.  [name], M.D., ABFM., CAQSM., AME. Primary Care and Sports Medicine [facility]  Adjunct Professor of Family Medicine  [place] [employer] [facility] [employer]  Restaurant manager, fast food

## 2022-09-23 NOTE — Assessment & Plan Note (Signed)
Recurrence of bilateral lateral hip pain, last injected December 2023, repeat injections today, return to see me as needed, continue home physical therapy.

## 2022-09-27 ENCOUNTER — Encounter: Payer: Self-pay | Admitting: Family Medicine

## 2022-09-27 ENCOUNTER — Ambulatory Visit: Payer: Medicare PPO | Admitting: Family Medicine

## 2022-09-27 VITALS — BP 185/79 | HR 72 | Ht 60.0 in | Wt 125.0 lb

## 2022-09-27 DIAGNOSIS — F3342 Major depressive disorder, recurrent, in full remission: Secondary | ICD-10-CM | POA: Diagnosis not present

## 2022-09-27 DIAGNOSIS — F411 Generalized anxiety disorder: Secondary | ICD-10-CM | POA: Diagnosis not present

## 2022-09-27 DIAGNOSIS — Z4802 Encounter for removal of sutures: Secondary | ICD-10-CM | POA: Diagnosis not present

## 2022-09-27 NOTE — Progress Notes (Signed)
Amanda Schmitt - 77 y.o. female MRN [id number]  Date of birth: [birth year]  Subjective Chief Complaint  Patient presents with   Suture / Staple Removal    HPI Amanda Schmitt is a 77 year old female here today for follow-up from recent ER visit.  She had a fall down a few stairs after tripping.  No dizziness or lightheadedness prior to fall.  Sutures placed in the right hand.  ER note reviewed.  She had 6 sutures placed.  She denies any increased pain or drainage from the wound.    She would also like a referral to a therapist deals more with the older population.  She did not find previous therapist to be a good fit.  ROS:  A comprehensive ROS was completed and negative except as noted per HPI  Allergies  Allergen Reactions   Moxifloxacin Other (See Comments)    Flu like symptoms   Nitrofuran Derivatives    Codeine Nausea Only   Tramadol Nausea Only    Past Medical History:  Diagnosis Date   Depression     Past Surgical History:  Procedure Laterality Date   ABDOMINAL HYSTERECTOMY     ABDOMINAL HYSTERECTOMY     Bowel blockage     BOWEL RESECTION     CESAREAN SECTION      Social History   Socioeconomic History   Marital status: Widowed    Spouse name: Not on file   Number of children: 2   Years of education: 16   Highest education level: Bachelor's degree (e.g., BA, AB, BS)  Occupational History   Occupation: Retired    Comment: Runner, broadcasting/film/video  Tobacco Use   Smoking status: Former    Packs/day: 0.50    Years: 26.00    Additional pack years: 0.00    Total pack years: 13.00    Types: Cigarettes    Quit date: 1999    Years since quitting: 25.4   Smokeless tobacco: Never  Substance and Sexual Activity   Alcohol use: Yes    Alcohol/week: 2.0 standard drinks of alcohol    Types: 2 Glasses of wine per week    Comment: per night   Drug use: No   Sexual activity: Not Currently  Other Topics Concern   Not on file  Social History Narrative   Lives alone. Volunteers at [employer]. Enjoys doing crossword puzzles, reading and painting furniture.   Social Determinants of Health   Financial Resource Strain: Low Risk  (05/23/2020)   Overall Financial Resource Strain (CARDIA)    Difficulty of Paying Living Expenses: Not hard at all  Food Insecurity: No Food Insecurity (11/18/2021)   Hunger Vital Sign    Worried About Running Out of Food in the Last Year: Never true    Ran Out of Food in the Last Year: Never true  Transportation Needs: No Transportation Needs (11/18/2021)   PRAPARE - Administrator, Civil Service (Medical): No    Lack of Transportation (Non-Medical): No  Physical Activity: Inactive (11/18/2021)   Exercise Vital Sign    Days of Exercise per Week: 0 days    Minutes of Exercise per Session: 0 min  Stress: No Stress Concern Present (11/18/2021)   [employer] of Occupational Health - Occupational Stress Questionnaire    Feeling of Stress : Not at all  Social Connections: Socially Isolated (11/18/2021)   Social Connection and Isolation Panel [NHANES]    Frequency of Communication with Friends and Family: More than three times a week  Frequency of Social Gatherings with Friends and Family: Three times a week    Attends Religious Services: Never    Active Member of Clubs or Organizations: No    Attends Banker Meetings: Never    Marital Status: Widowed    Family History  Problem Relation Age of Onset   Cancer Father        unorgin    Health Maintenance  Topic Date Due   Medicare Annual Wellness (AWV)  11/19/2022   MAMMOGRAM  11/19/2022 (Originally 08/22/2022)   DEXA SCAN  11/19/2022 (Originally 05/24/2021)   Hepatitis C Screening  11/19/2022 (Originally 06/24/1963)   INFLUENZA VACCINE  12/09/2022   DTaP/Tdap/Td (4 - Td or Tdap) 08/24/2026   Pneumonia Vaccine 46+ Years old  Completed   COVID-19 Vaccine  Completed   Zoster Vaccines- Shingrix  Completed   HPV VACCINES  Aged Out   COLONOSCOPY (Pts 45-44yrs  Insurance coverage will need to be confirmed)  Discontinued     ----------------------------------------------------------------------------------------------------------------------------------------------------------------------------------------------------------------- Physical Exam BP (!) 185/79 (BP Location: Right Arm, Patient Position: Sitting, Cuff Size: Small)   Pulse 72   Ht 5' (1.524 m)   Wt 125 lb (56.7 kg)   SpO2 99%   BMI 24.41 kg/m   Physical Exam Constitutional:      Appearance: Normal appearance.  HENT:     Head: Normocephalic and atraumatic.  Eyes:     General: No scleral icterus. Skin:    Comments: Laceration to right hand appears to be healing well.  Sick sutures removed without difficulty.  There is a small amount of dehiscence in the central portion of the wound.  Steri-Strips applied.  Neurological:     Mental Status: She is alert.     ------------------------------------------------------------------------------------------------------------------------------------------------------------------------------------------------------------------- Assessment and Plan  Major depressive disorder in full remission [facility]) Referral entered to therapist.  Visit for suture removal Sutures removed today.  Small amount of dehiscence to the central portion of wound.  Steri-Strips applied to this area.  No signs of infection at this time.   No orders of the defined types were placed in this encounter.   No follow-ups on file.    This visit occurred during the SARS-CoV-2 public health emergency.  Safety protocols were in place, including screening questions prior to the visit, additional usage of staff PPE, and extensive cleaning of exam room while observing appropriate contact time as indicated for disinfecting solutions.

## 2022-09-27 NOTE — Assessment & Plan Note (Signed)
Sutures removed today.  Small amount of dehiscence to the central portion of wound.  Steri-Strips applied to this area.  No signs of infection at this time.

## 2022-09-27 NOTE — Assessment & Plan Note (Signed)
Referral entered to therapist.

## 2022-09-28 DIAGNOSIS — Z79899 Other long term (current) drug therapy: Secondary | ICD-10-CM | POA: Diagnosis not present

## 2022-09-28 DIAGNOSIS — L650 Telogen effluvium: Secondary | ICD-10-CM | POA: Diagnosis not present

## 2022-10-01 ENCOUNTER — Ambulatory Visit: Payer: Medicare PPO | Admitting: Medical-Surgical

## 2022-10-01 ENCOUNTER — Encounter: Payer: Self-pay | Admitting: Family Medicine

## 2022-10-01 ENCOUNTER — Ambulatory Visit (INDEPENDENT_AMBULATORY_CARE_PROVIDER_SITE_OTHER): Payer: Medicare PPO

## 2022-10-01 ENCOUNTER — Encounter: Payer: Self-pay | Admitting: Medical-Surgical

## 2022-10-01 VITALS — BP 135/77 | HR 74 | Resp 20 | Ht 60.0 in | Wt 124.6 lb

## 2022-10-01 DIAGNOSIS — M25552 Pain in left hip: Secondary | ICD-10-CM

## 2022-10-01 NOTE — Progress Notes (Signed)
        Established patient visit  History, exam, impression, and plan:  1. Left hip pain Amanda Schmitt 77-year-old female presenting today for evaluation of left hip pain.  Reports falling down 9 stairs 1 week ago.  Had significant bruising along the left side as well as some soreness.  The bruising and soreness have been gradually improving however about 2 days ago, had some severe pain in the left buttock that has made it difficult to walk.  No temperature changes, numbness, tingling, or swelling to the left lower extremity.  Walking makes the pain worse.  Has been using as needed ibuprofen, rest, and heat.  On exam, gait halting, favoring the left side.  Full range of motion to the left hip, pain only with full flexion.  Negative FADIR, positive FABER but pain noted to be in the SI joint rather than in the previously indicated area of the buttock lower down.  Getting left hip x-rays today.  Discussed pain medications but declined today.  Recommend continuing conservative measures with Tylenol/ibuprofen, rest, heat/ice, massage, and gentle stretching.   Procedures performed this visit: None.  Return if symptoms worsen or fail to improve.  __________________________________ Thayer Ohm, DNP, APRN, FNP-BC Primary Care and Sports Medicine [facility]

## 2022-10-06 ENCOUNTER — Telehealth: Payer: Self-pay | Admitting: Family Medicine

## 2022-10-06 NOTE — Telephone Encounter (Signed)
I think sent to [facility] in error

## 2022-10-06 NOTE — Telephone Encounter (Signed)
"  Amanda Schmitt 77-year-old female presenting today for evaluation of left hip pain". Pt called about making the above correction.

## 2022-10-06 NOTE — Telephone Encounter (Signed)
Dragonism corrected!

## 2022-10-07 ENCOUNTER — Encounter: Payer: Self-pay | Admitting: Medical-Surgical

## 2022-10-07 NOTE — Telephone Encounter (Signed)
Report received and result note sent.

## 2022-10-07 NOTE — Telephone Encounter (Signed)
Seen by patient Amanda Schmitt on 10/07/2022  3:39 PM

## 2022-10-07 NOTE — Telephone Encounter (Signed)
I called [facility] Radiology and the xray will result soon.

## 2022-10-12 ENCOUNTER — Other Ambulatory Visit: Payer: Self-pay | Admitting: Family Medicine

## 2022-10-12 DIAGNOSIS — F3342 Major depressive disorder, recurrent, in full remission: Secondary | ICD-10-CM

## 2022-10-21 DIAGNOSIS — F1021 Alcohol dependence, in remission: Secondary | ICD-10-CM | POA: Diagnosis not present

## 2022-10-21 DIAGNOSIS — F330 Major depressive disorder, recurrent, mild: Secondary | ICD-10-CM | POA: Diagnosis not present

## 2022-10-25 DIAGNOSIS — F330 Major depressive disorder, recurrent, mild: Secondary | ICD-10-CM | POA: Diagnosis not present

## 2022-10-25 DIAGNOSIS — F1021 Alcohol dependence, in remission: Secondary | ICD-10-CM | POA: Diagnosis not present

## 2022-11-03 DIAGNOSIS — F330 Major depressive disorder, recurrent, mild: Secondary | ICD-10-CM | POA: Diagnosis not present

## 2022-11-03 DIAGNOSIS — F1021 Alcohol dependence, in remission: Secondary | ICD-10-CM | POA: Diagnosis not present

## 2022-11-10 DIAGNOSIS — F330 Major depressive disorder, recurrent, mild: Secondary | ICD-10-CM | POA: Diagnosis not present

## 2022-11-10 DIAGNOSIS — F1021 Alcohol dependence, in remission: Secondary | ICD-10-CM | POA: Diagnosis not present

## 2022-11-12 IMAGING — DX DG HIP (WITH OR WITHOUT PELVIS) 2-3V*R*
3 series · 3 of 3 positions shown · non-contrast
Comparison: None.

CLINICAL DATA: Bilateral hip and low back pain for a year. No known
injury.

EXAM:
DG HIP (WITH OR WITHOUT PELVIS) 2-3V RIGHT

[pelvis ap]
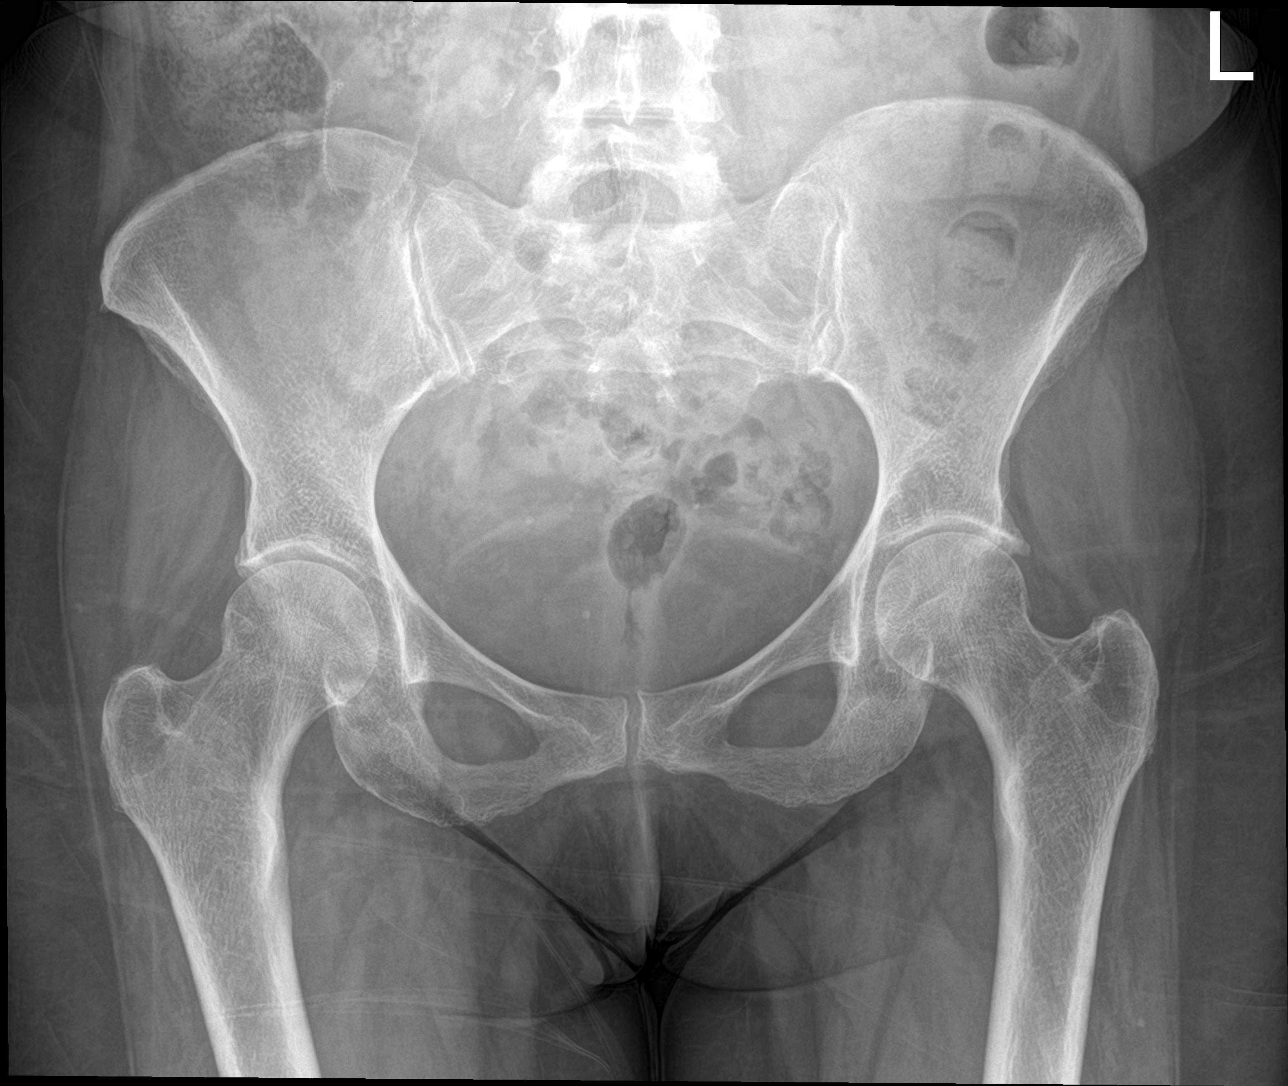

[hip ap]
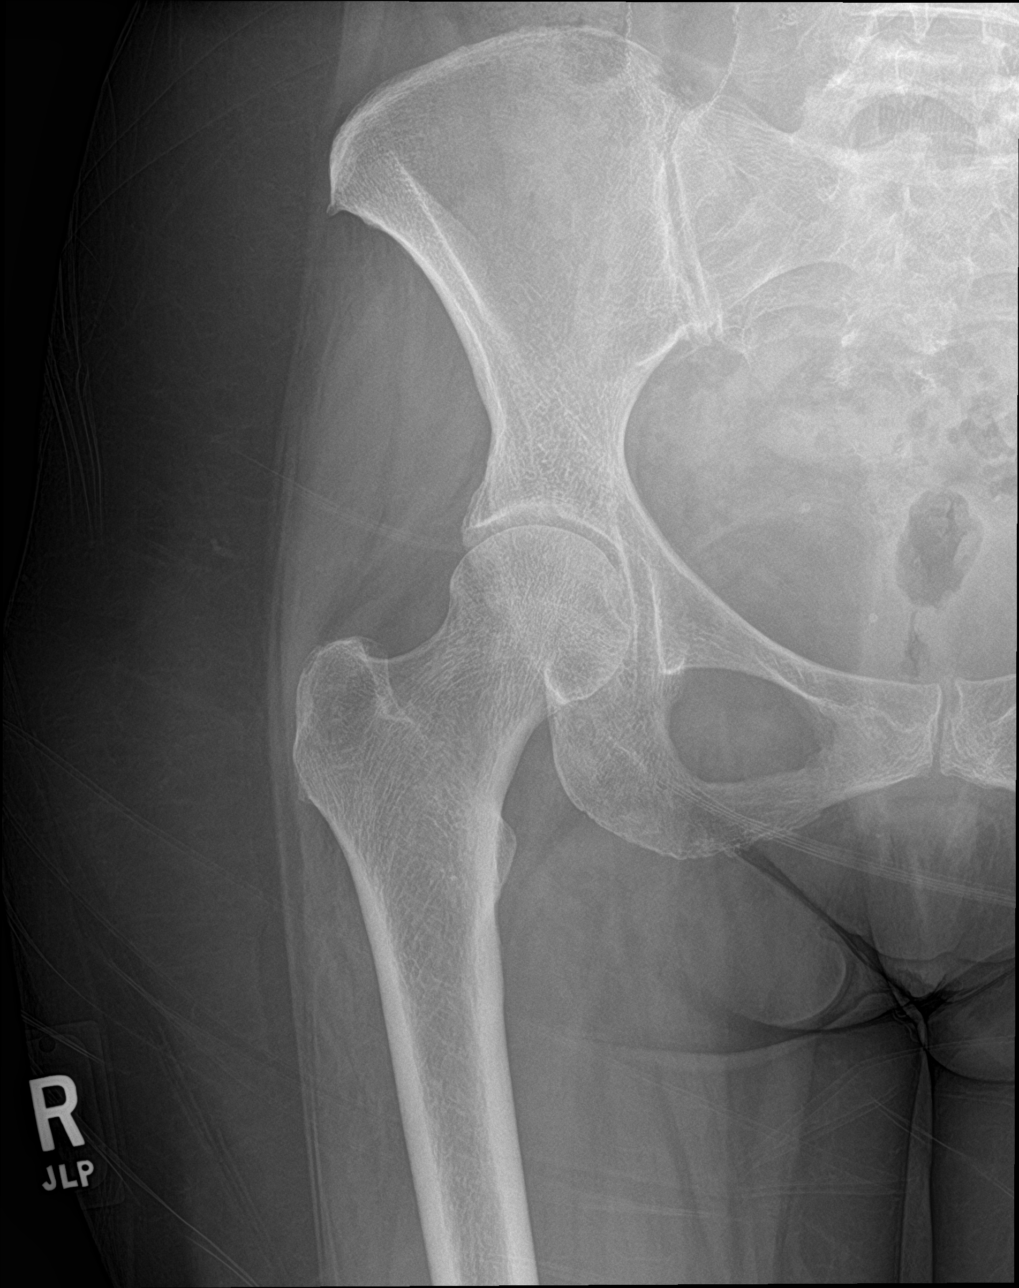

[hip lat]
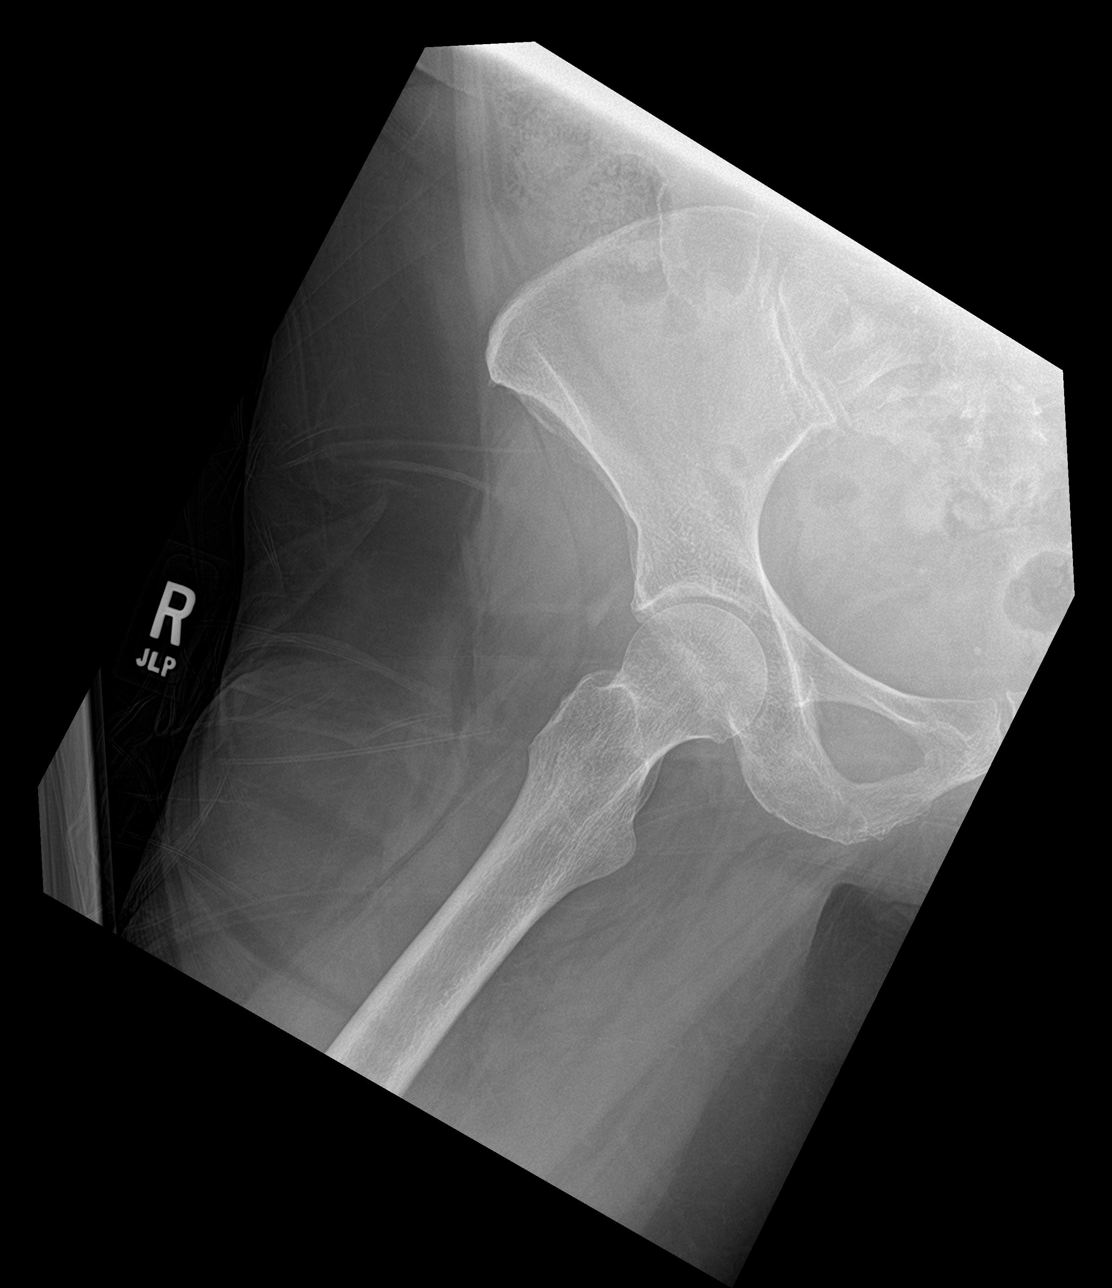

[3 of 3 positions shown; findings below may reference images not displayed]

FINDINGS: There is no evidence of hip fracture or dislocation. There is no
evidence of arthropathy or other focal bone abnormality.
IMPRESSION: Negative.

## 2022-11-12 IMAGING — DX DG LUMBAR SPINE COMPLETE 4+V
5 series · 5 of 5 positions shown · non-contrast
Comparison: None.

CLINICAL DATA: Pain for a year without trauma.

EXAM:
LUMBAR SPINE - COMPLETE 4+ VIEW

[l-spine ap]
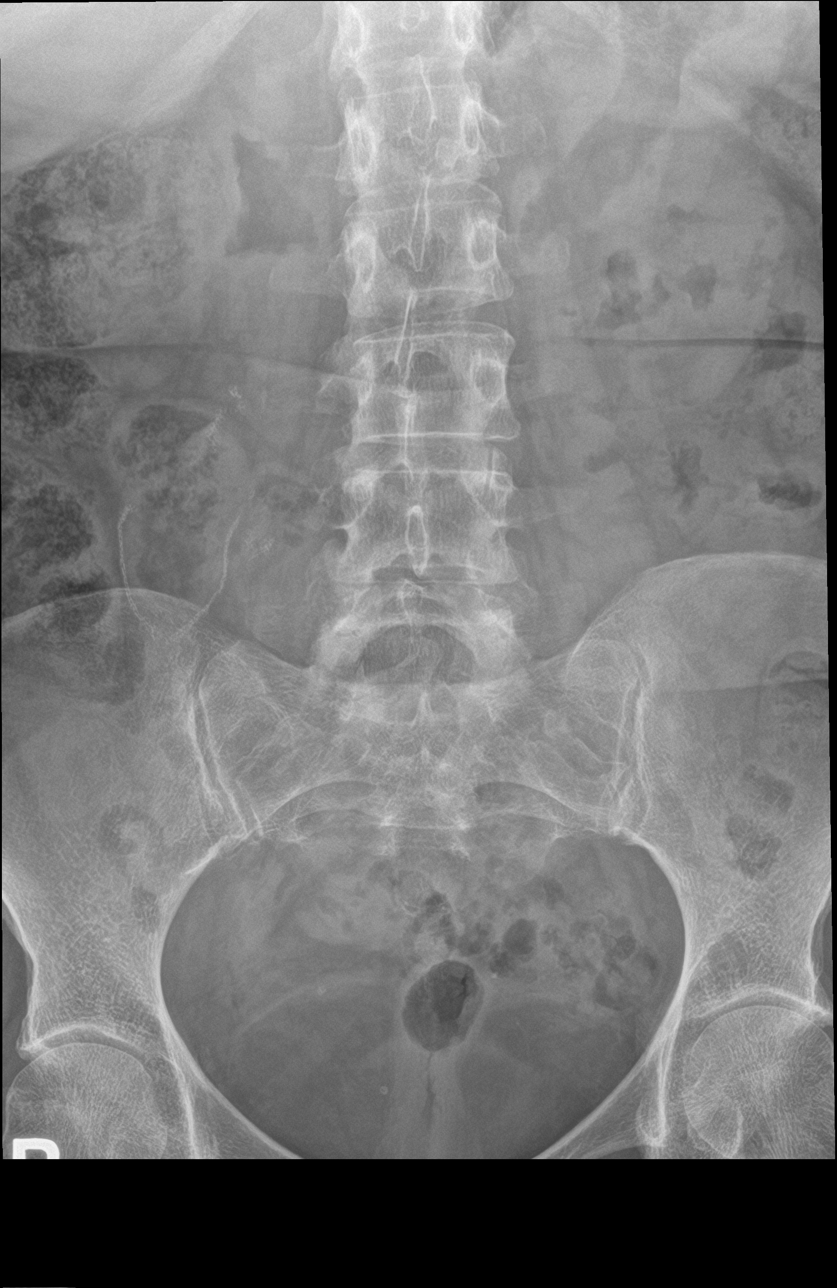

[l-spine obl (1 of 2)]
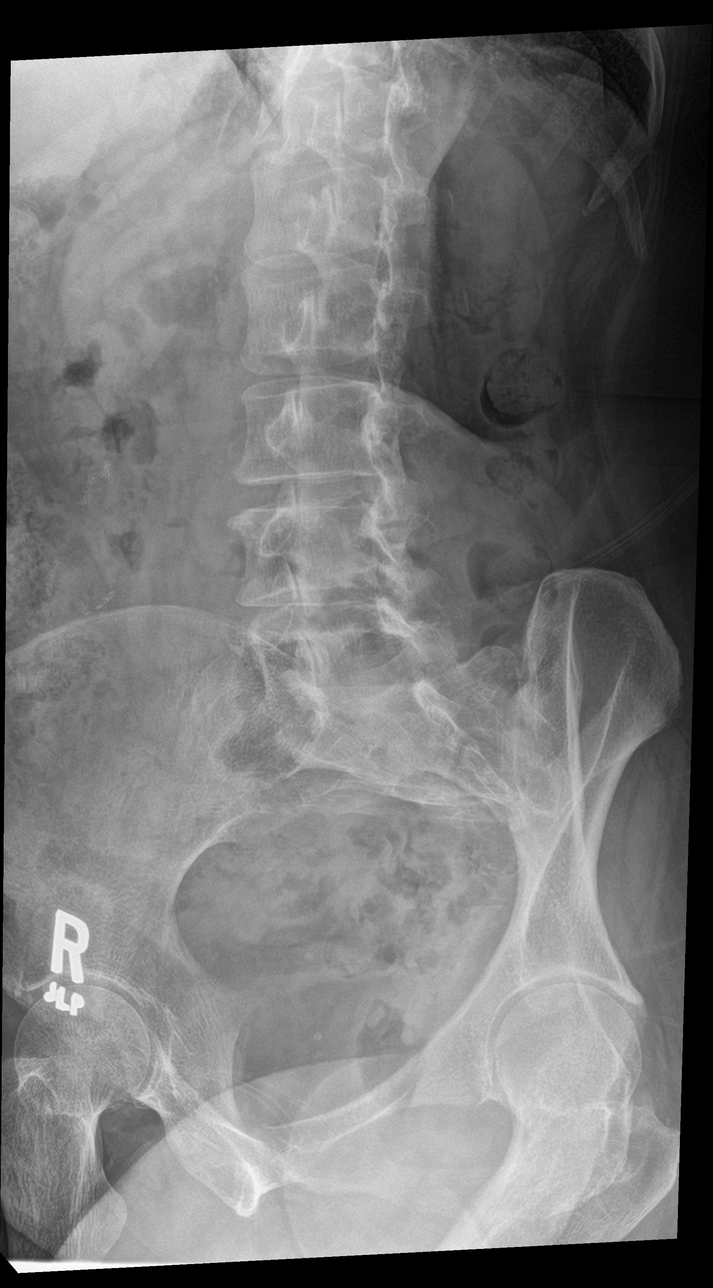

[l-spine obl (2 of 2)]
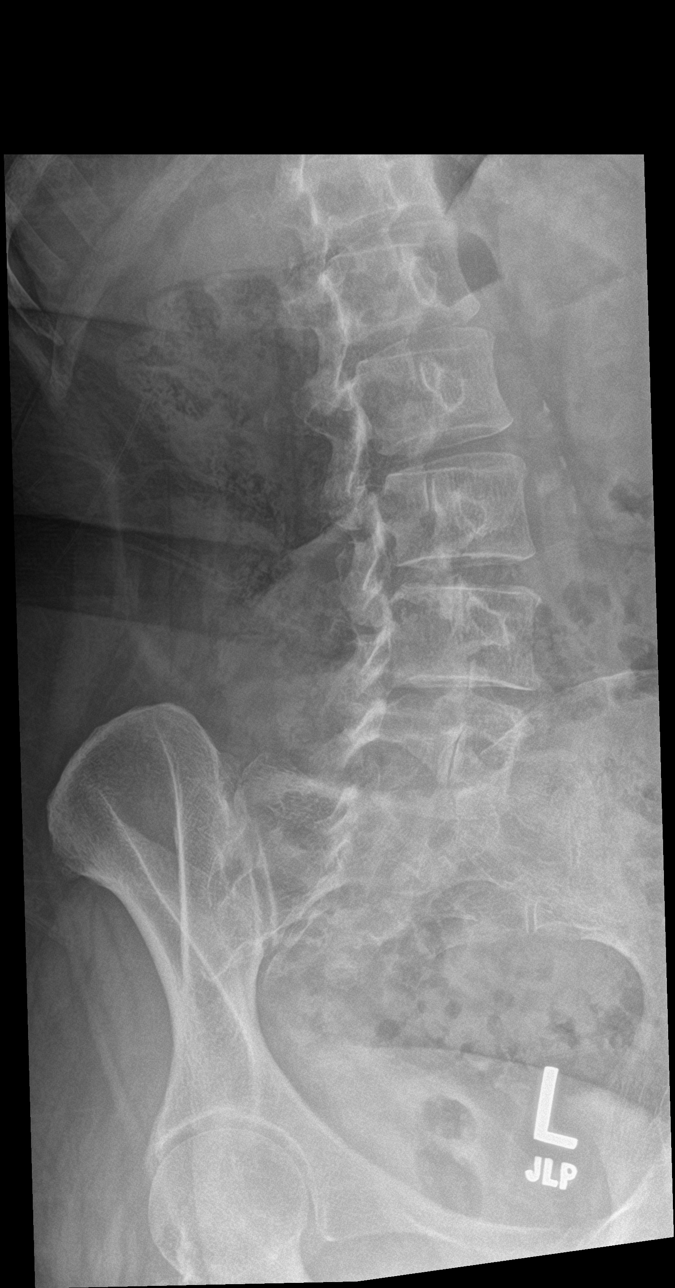

[l-spine lat]
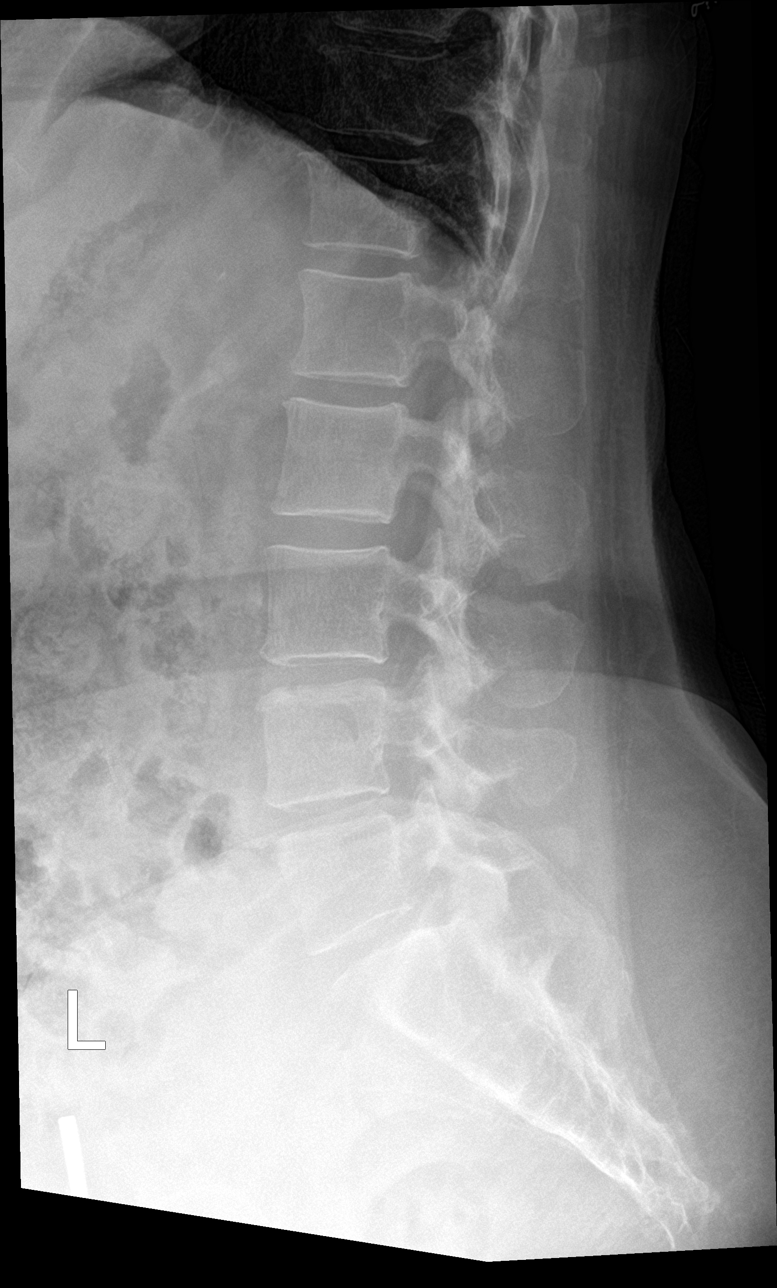

[l-spine spot]
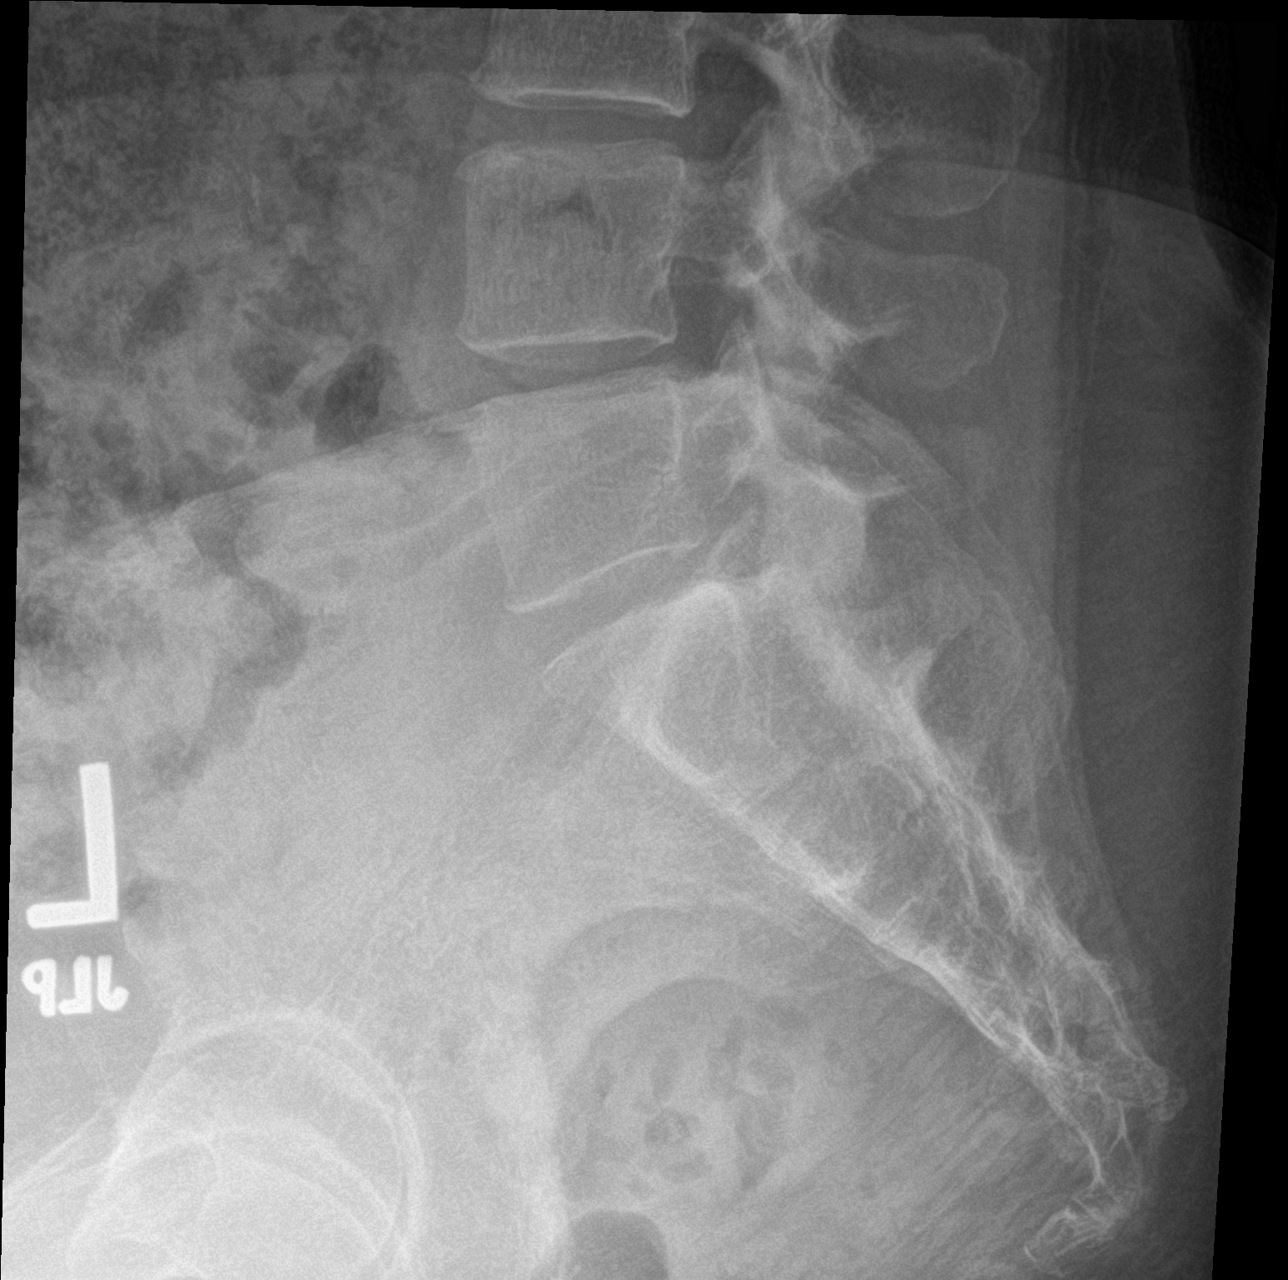

[5 of 5 positions shown; findings below may reference images not displayed]

FINDINGS: No fracture or traumatic malalignment. Minimal multilevel
degenerative disc disease with tiny anterior osteophytes. No other
significant abnormalities.
IMPRESSION: Minimal degenerative disc disease.  No other abnormalities.

## 2022-11-12 IMAGING — DX DG HIP (WITH OR WITHOUT PELVIS) 2-3V*L*
2 series · 2 of 2 positions shown · non-contrast
Comparison: None.

CLINICAL DATA: Bilateral hip pain

EXAM:
DG HIP (WITH OR WITHOUT PELVIS) 2V LEFT

[hip ap]
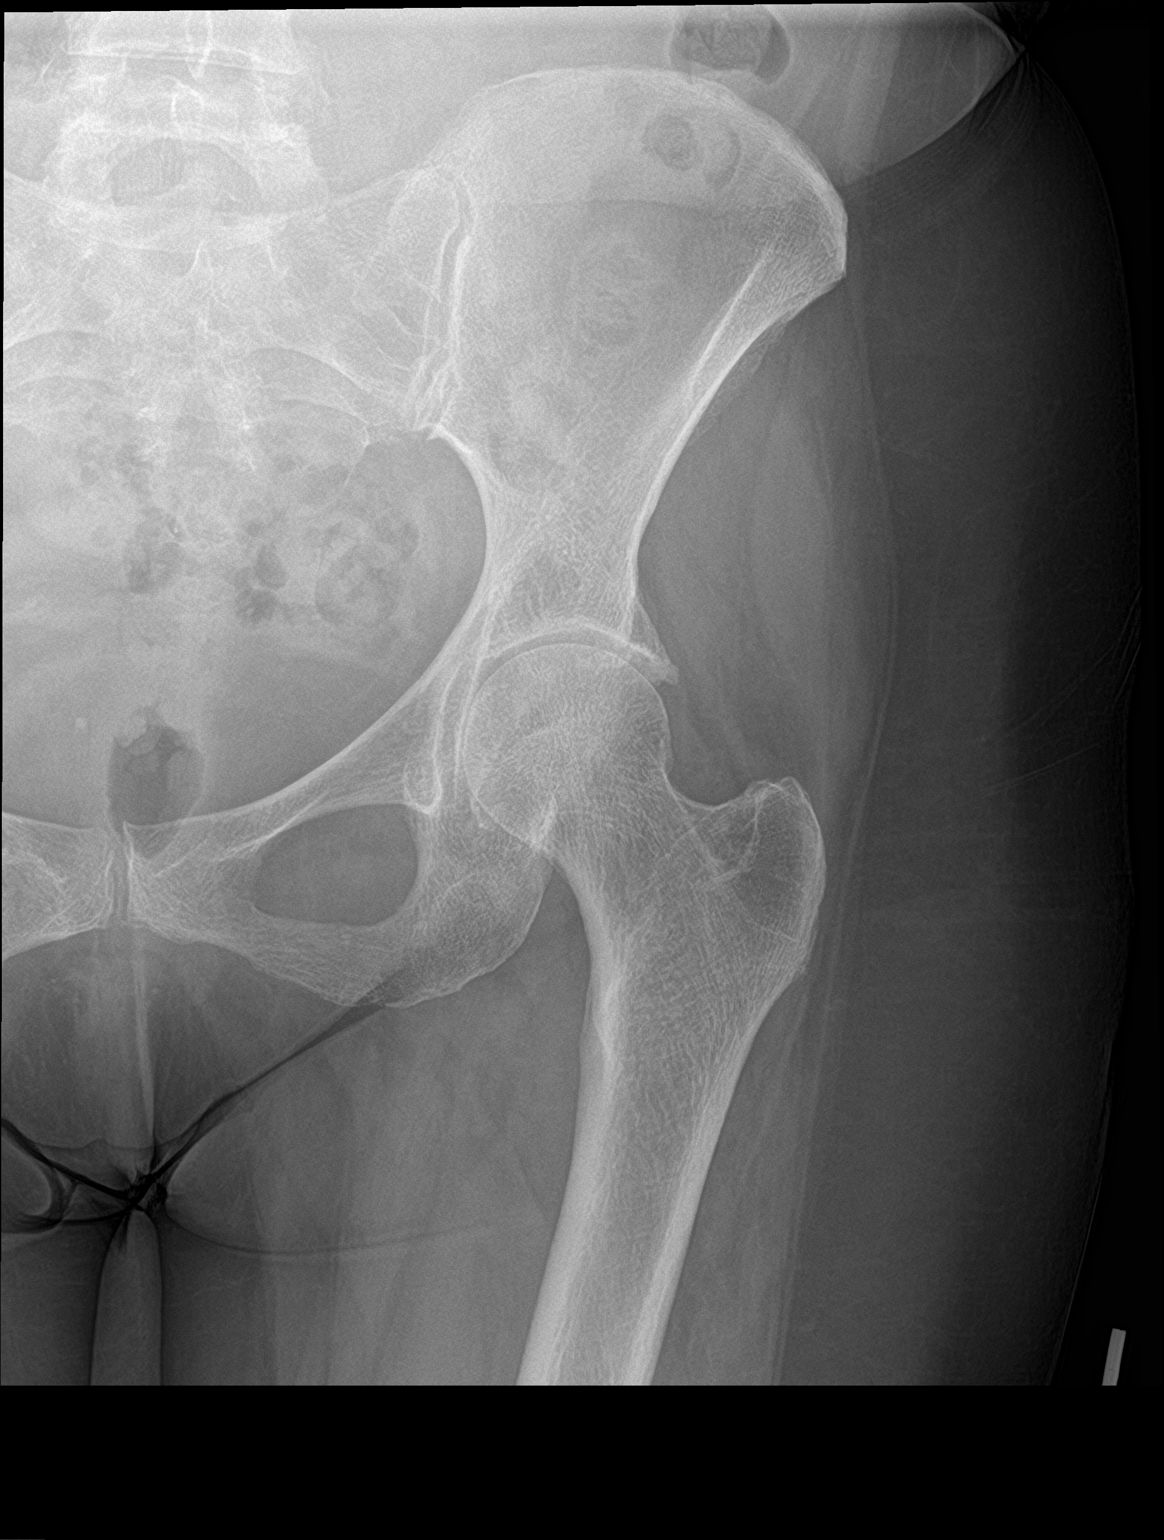

[hip lat]
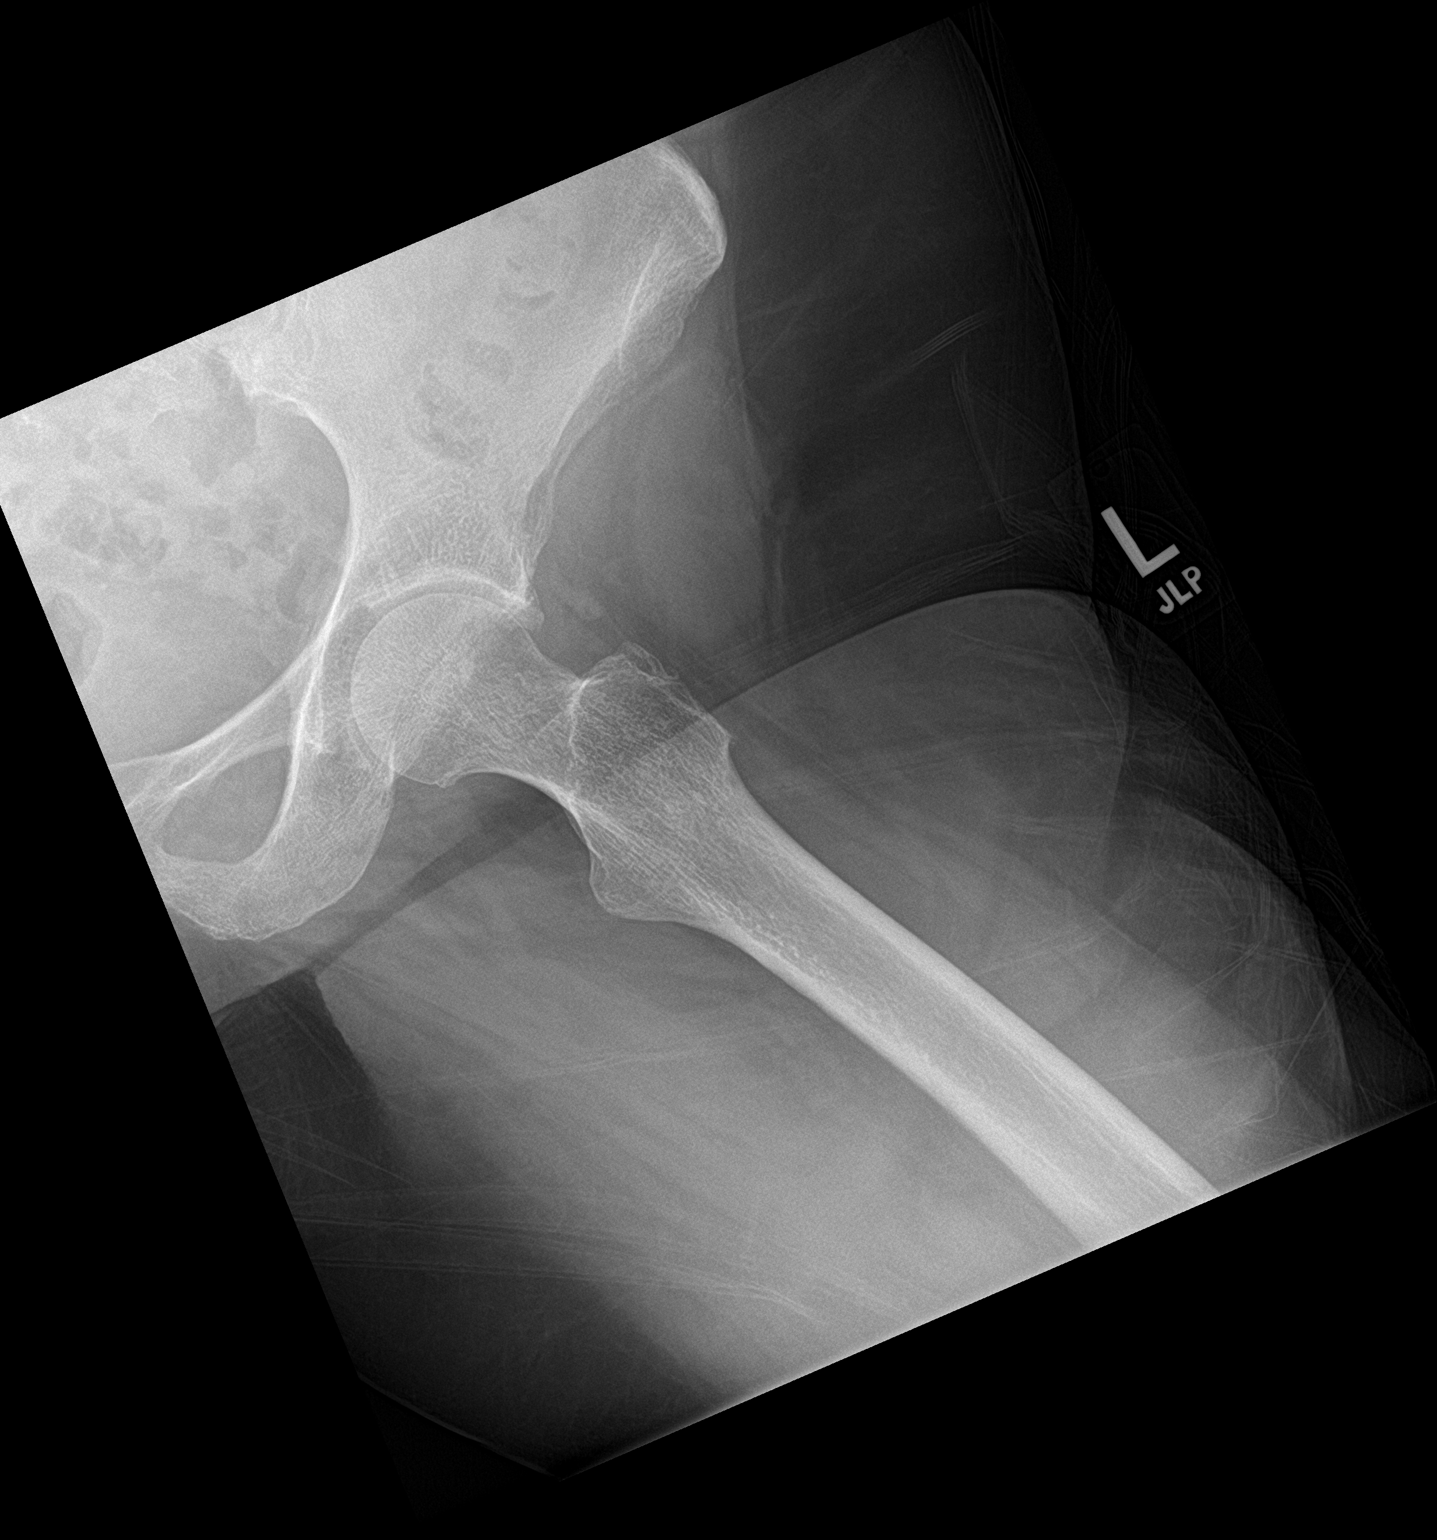

[2 of 2 positions shown; findings below may reference images not displayed]

FINDINGS: Degenerative changes of the left hip joint are noted. No acute
fracture or dislocation is seen. No soft tissue abnormality is
noted.
IMPRESSION: Degenerative change without acute abnormality.

## 2022-11-18 DIAGNOSIS — F330 Major depressive disorder, recurrent, mild: Secondary | ICD-10-CM | POA: Diagnosis not present

## 2022-11-24 DIAGNOSIS — F1021 Alcohol dependence, in remission: Secondary | ICD-10-CM | POA: Diagnosis not present

## 2022-11-24 DIAGNOSIS — F330 Major depressive disorder, recurrent, mild: Secondary | ICD-10-CM | POA: Diagnosis not present

## 2022-12-01 DIAGNOSIS — F330 Major depressive disorder, recurrent, mild: Secondary | ICD-10-CM | POA: Diagnosis not present

## 2022-12-01 DIAGNOSIS — F1021 Alcohol dependence, in remission: Secondary | ICD-10-CM | POA: Diagnosis not present

## 2022-12-15 DIAGNOSIS — F1021 Alcohol dependence, in remission: Secondary | ICD-10-CM | POA: Diagnosis not present

## 2022-12-15 DIAGNOSIS — F330 Major depressive disorder, recurrent, mild: Secondary | ICD-10-CM | POA: Diagnosis not present

## 2022-12-29 DIAGNOSIS — L680 Hirsutism: Secondary | ICD-10-CM | POA: Diagnosis not present

## 2022-12-29 DIAGNOSIS — L650 Telogen effluvium: Secondary | ICD-10-CM | POA: Diagnosis not present

## 2023-01-03 ENCOUNTER — Ambulatory Visit: Payer: Medicare PPO | Admitting: Sports Medicine

## 2023-01-03 DIAGNOSIS — M2550 Pain in unspecified joint: Secondary | ICD-10-CM | POA: Diagnosis not present

## 2023-01-03 DIAGNOSIS — M160 Bilateral primary osteoarthritis of hip: Secondary | ICD-10-CM

## 2023-01-03 MED ORDER — MELOXICAM 15 MG PO TABS
ORAL_TABLET | ORAL | 3 refills | Status: AC
Start: 2023-01-03 — End: ?

## 2023-01-03 NOTE — Assessment & Plan Note (Signed)
Significant morning stiffness, widespread achiness, pulling the trigger for rheumatoid workup.  Update:  CCP has come back elevated.  This test is relatively specific for rheumatoid arthritis, considering the significant morning stiffness, widespread achiness I would like a consultation with rheumatology.

## 2023-01-03 NOTE — Progress Notes (Signed)
    Procedures performed today:    None.  Independent interpretation of notes and tests performed by another provider:   None.  Brief History, Exam, Impression, and Recommendations:    Primary osteoarthritis of both hips Bilateral hip pain localized in the groin worse with internal rotation. Switching to meloxicam, adding home PT, return in 2 to 4 weeks, bilateral hip joint injections if not better.  Polyarthralgia Significant morning stiffness, widespread achiness, pulling the trigger for rheumatoid workup.    ____________________________________________ Ihor Austin. [name], M.D., ABFM., CAQSM., AME. Primary Care and Sports Medicine  [facility]  Adjunct Professor of Family Medicine  [place] [employer] [facility] [employer]  Restaurant manager, fast food

## 2023-01-03 NOTE — Assessment & Plan Note (Signed)
Bilateral hip pain localized in the groin worse with internal rotation. Switching to meloxicam, adding home PT, return in 2 to 4 weeks, bilateral hip joint injections if not better.

## 2023-01-06 ENCOUNTER — Encounter: Payer: Self-pay | Admitting: Sports Medicine

## 2023-01-06 LAB — CBC WITH DIFFERENTIAL/PLATELET
Basophils Absolute: 0 10*3/uL (ref 0.0–0.2)
Basos: 1 %
EOS (ABSOLUTE): 0.1 10*3/uL (ref 0.0–0.4)
Eos: 2 %
Hematocrit: 37.3 % (ref 34.0–46.6)
Hemoglobin: 12.9 g/dL (ref 11.1–15.9)
Immature Grans (Abs): 0 10*3/uL (ref 0.0–0.1)
Immature Granulocytes: 0 %
Lymphocytes Absolute: 2 10*3/uL (ref 0.7–3.1)
Lymphs: 25 %
MCH: 33.1 pg — ABNORMAL HIGH (ref 26.6–33.0)
MCHC: 34.6 g/dL (ref 31.5–35.7)
MCV: 96 fL (ref 79–97)
Monocytes Absolute: 0.6 10*3/uL (ref 0.1–0.9)
Monocytes: 7 %
Neutrophils Absolute: 5.1 10*3/uL (ref 1.4–7.0)
Neutrophils: 65 %
Platelets: 277 10*3/uL (ref 150–450)
RBC: 3.9 x10E6/uL (ref 3.77–5.28)
RDW: 11.2 % — ABNORMAL LOW (ref 11.7–15.4)
WBC: 7.8 10*3/uL (ref 3.4–10.8)

## 2023-01-06 LAB — COMPREHENSIVE METABOLIC PANEL
ALT: 12 IU/L (ref 0–32)
AST: 23 IU/L (ref 0–40)
Albumin: 4.4 g/dL (ref 3.8–4.8)
Alkaline Phosphatase: 91 IU/L (ref 44–121)
BUN/Creatinine Ratio: 23 (ref 12–28)
BUN: 16 mg/dL (ref 8–27)
Bilirubin Total: 0.5 mg/dL (ref 0.0–1.2)
CO2: 22 mmol/L (ref 20–29)
Calcium: 9.5 mg/dL (ref 8.7–10.3)
Chloride: 99 mmol/L (ref 96–106)
Creatinine, Ser: 0.69 mg/dL (ref 0.57–1.00)
Globulin, Total: 2.2 g/dL (ref 1.5–4.5)
Glucose: 82 mg/dL (ref 70–99)
Potassium: 4.2 mmol/L (ref 3.5–5.2)
Sodium: 139 mmol/L (ref 134–144)
Total Protein: 6.6 g/dL (ref 6.0–8.5)
eGFR: 89 mL/min/{1.73_m2} (ref >59)

## 2023-01-06 LAB — RHEUMATOID ARTHRITIS PROFILE
Cyclic Citrullin Peptide Ab: 29 units — ABNORMAL HIGH (ref 0–19)
Rheumatoid fact SerPl-aCnc: 10.4 IU/mL (ref <14.0)

## 2023-01-06 LAB — CK: Total CK: 48 U/L (ref 32–182)

## 2023-01-06 LAB — URIC ACID: Uric Acid: 5 mg/dL (ref 3.1–7.9)

## 2023-01-06 NOTE — Addendum Note (Signed)
Addended by: Monica Becton on: 01/06/2023 09:15 AM   Modules accepted: Orders

## 2023-01-17 ENCOUNTER — Encounter: Payer: Self-pay | Admitting: Family Medicine

## 2023-01-17 MED ORDER — ZOLPIDEM TARTRATE 5 MG PO TABS: 5.0000 mg | ORAL_TABLET | Freq: Every evening | ORAL | 3 refills | Status: DC | PRN

## 2023-01-17 NOTE — Telephone Encounter (Signed)
Updated rx sent in.   CM

## 2023-01-24 ENCOUNTER — Other Ambulatory Visit: Payer: Self-pay | Admitting: Family Medicine

## 2023-01-25 ENCOUNTER — Telehealth: Payer: Self-pay

## 2023-01-25 NOTE — Telephone Encounter (Signed)
Forwarded OV notes to [facility]ology per referral request.

## 2023-01-31 DIAGNOSIS — M25521 Pain in right elbow: Secondary | ICD-10-CM | POA: Diagnosis not present

## 2023-01-31 DIAGNOSIS — M25559 Pain in unspecified hip: Secondary | ICD-10-CM | POA: Diagnosis not present

## 2023-01-31 DIAGNOSIS — M79642 Pain in left hand: Secondary | ICD-10-CM | POA: Diagnosis not present

## 2023-01-31 DIAGNOSIS — M25522 Pain in left elbow: Secondary | ICD-10-CM | POA: Diagnosis not present

## 2023-01-31 DIAGNOSIS — Z6824 Body mass index (BMI) 24.0-24.9, adult: Secondary | ICD-10-CM | POA: Diagnosis not present

## 2023-01-31 DIAGNOSIS — R7989 Other specified abnormal findings of blood chemistry: Secondary | ICD-10-CM | POA: Diagnosis not present

## 2023-01-31 DIAGNOSIS — M79641 Pain in right hand: Secondary | ICD-10-CM | POA: Diagnosis not present

## 2023-01-31 DIAGNOSIS — R5383 Other fatigue: Secondary | ICD-10-CM | POA: Diagnosis not present

## 2023-02-10 ENCOUNTER — Ambulatory Visit: Payer: Medicare PPO | Admitting: Family Medicine

## 2023-02-10 ENCOUNTER — Encounter: Payer: Self-pay | Admitting: Family Medicine

## 2023-02-10 VITALS — BP 144/79 | HR 70 | Ht 60.0 in | Wt 127.0 lb

## 2023-02-10 DIAGNOSIS — R946 Abnormal results of thyroid function studies: Secondary | ICD-10-CM | POA: Insufficient documentation

## 2023-02-10 DIAGNOSIS — M2550 Pain in unspecified joint: Secondary | ICD-10-CM | POA: Diagnosis not present

## 2023-02-10 NOTE — Assessment & Plan Note (Signed)
Checking TPO antibodies today to follow up abnormal T3 uptake.

## 2023-02-10 NOTE — Progress Notes (Signed)
Schmitt Amanda - 77 y.o. female MRN [id number]  Date of birth: [birth year]  Subjective Chief Complaint  Patient presents with   Results    HPI Amanda Schmitt is a 77 y.o. female here today for follow up.  Has been seen by Dr. [name] for joint pain/OA.  CCP checked recently and was elevated.  Referred to rheumatology.  Rheumatologist did not feel that she had RA.  She is back to discuss where to go from here.  She is not on any NSAIDs currently.  Prescribed meloxicam and broke out in a rash.    She also had thyroid testing with he rheumatology.  TSH and T4 normal.  T3 uptake slightly low.  She admits to feeling fatigued and sleeping for longer periods.   ROS:  A comprehensive ROS was completed and negative except as noted per HPI  Allergies  Allergen Reactions   Moxifloxacin Other (See Comments)    Flu like symptoms   Nitrofuran Derivatives    Codeine Nausea Only   Meloxicam Rash   Tramadol Nausea Only    Past Medical History:  Diagnosis Date   Depression     Past Surgical History:  Procedure Laterality Date   ABDOMINAL HYSTERECTOMY     ABDOMINAL HYSTERECTOMY     Bowel blockage     BOWEL RESECTION     CESAREAN SECTION      Social History   Socioeconomic History   Marital status: Widowed    Spouse name: Not on file   Number of children: 2   Years of education: 16   Highest education level: Bachelor's degree (e.g., BA, AB, BS)  Occupational History   Occupation: Retired    Comment: Runner, broadcasting/film/video  Tobacco Use   Smoking status: Former    Current packs/day: 0.00    Average packs/day: 0.5 packs/day for 26.0 years (13.0 ttl pk-yrs)    Types: Cigarettes    Start date: 44    Quit date: 1999    Years since quitting: 25.7   Smokeless tobacco: Never  Substance and Sexual Activity   Alcohol use: Yes    Alcohol/week: 2.0 standard drinks of alcohol    Types: 2 Glasses of wine per week    Comment: per night   Drug use: No   Sexual activity: Not Currently  Other  Topics Concern   Not on file  Social History Narrative   Lives alone. Volunteers at [employer]. Enjoys doing crossword puzzles, reading and painting furniture.   Social Determinants of Health   Financial Resource Strain: Low Risk  (01/03/2023)   Overall Financial Resource Strain (CARDIA)    Difficulty of Paying Living Expenses: Not hard at all  Food Insecurity: No Food Insecurity (01/03/2023)   Hunger Vital Sign    Worried About Running Out of Food in the Last Year: Never true    Ran Out of Food in the Last Year: Never true  Transportation Needs: No Transportation Needs (01/03/2023)   PRAPARE - Administrator, Civil Service (Medical): No    Lack of Transportation (Non-Medical): No  Physical Activity: Insufficiently Active (01/03/2023)   Exercise Vital Sign    Days of Exercise per Week: 2 days    Minutes of Exercise per Session: 30 min  Stress: No Stress Concern Present (01/03/2023)   [employer] of Occupational Health - Occupational Stress Questionnaire    Feeling of Stress : Not at all  Social Connections: Socially Isolated (01/03/2023)   Social Connection and Isolation Panel [  NHANES]    Frequency of Communication with Friends and Family: More than three times a week    Frequency of Social Gatherings with Friends and Family: Twice a week    Attends Religious Services: Never    Database administrator or Organizations: No    Attends Engineer, structural: Not on file    Marital Status: Widowed    Family History  Problem Relation Age of Onset   Cancer Father        unorgin    Health Maintenance  Topic Date Due   COVID-19 Vaccine (6 - 2023-24 season) 02/26/2023 (Originally 01/09/2023)   Medicare Annual Wellness (AWV)  03/13/2023 (Originally 11/19/2022)   INFLUENZA VACCINE  08/08/2023 (Originally 12/09/2022)   Hepatitis C Screening  02/10/2024 (Originally 06/24/1963)   MAMMOGRAM  08/23/2023   DEXA SCAN  12/04/2023   DTaP/Tdap/Td (4 - Td or Tdap)  08/24/2026   Pneumonia Vaccine 76+ Years old  Completed   Zoster Vaccines- Shingrix  Completed   HPV VACCINES  Aged Out   Colonoscopy  Discontinued     ----------------------------------------------------------------------------------------------------------------------------------------------------------------------------------------------------------------- Physical Exam BP (!) 144/79 (BP Location: Left Arm, Patient Position: Sitting, Cuff Size: Small)   Pulse 70   Ht 5' (1.524 m)   Wt 127 lb (57.6 kg)   SpO2 99%   BMI 24.80 kg/m   Physical Exam Constitutional:      Appearance: Normal appearance.  Neurological:     Mental Status: She is alert.  Psychiatric:        Mood and Affect: Mood normal.        Behavior: Behavior normal.     ------------------------------------------------------------------------------------------------------------------------------------------------------------------------------------------------------------------- Assessment and Plan  Thyroid function study abnormality Checking TPO antibodies today to follow up abnormal T3 uptake.   Polyarthralgia with elevated CCP Seen by rheumatology recently and told that it was unlikely that she had RA.  Recommend continued management as OA.  She will plan to f/u with Dr. [name].     No orders of the defined types were placed in this encounter.   No follow-ups on file.    This visit occurred during the SARS-CoV-2 public health emergency.  Safety protocols were in place, including screening questions prior to the visit, additional usage of staff PPE, and extensive cleaning of exam room while observing appropriate contact time as indicated for disinfecting solutions.

## 2023-02-10 NOTE — Assessment & Plan Note (Signed)
Seen by rheumatology recently and told that it was unlikely that she had RA.  Recommend continued management as OA.  She will plan to f/u with Dr. [name].

## 2023-02-14 DIAGNOSIS — R5383 Other fatigue: Secondary | ICD-10-CM | POA: Diagnosis not present

## 2023-02-14 DIAGNOSIS — M79641 Pain in right hand: Secondary | ICD-10-CM | POA: Diagnosis not present

## 2023-02-14 DIAGNOSIS — E663 Overweight: Secondary | ICD-10-CM | POA: Diagnosis not present

## 2023-02-14 DIAGNOSIS — Z6825 Body mass index (BMI) 25.0-25.9, adult: Secondary | ICD-10-CM | POA: Diagnosis not present

## 2023-02-14 DIAGNOSIS — M25559 Pain in unspecified hip: Secondary | ICD-10-CM | POA: Diagnosis not present

## 2023-02-14 DIAGNOSIS — R7989 Other specified abnormal findings of blood chemistry: Secondary | ICD-10-CM | POA: Diagnosis not present

## 2023-02-14 DIAGNOSIS — M79642 Pain in left hand: Secondary | ICD-10-CM | POA: Diagnosis not present

## 2023-02-14 DIAGNOSIS — M25521 Pain in right elbow: Secondary | ICD-10-CM | POA: Diagnosis not present

## 2023-02-14 DIAGNOSIS — M25522 Pain in left elbow: Secondary | ICD-10-CM | POA: Diagnosis not present

## 2023-02-16 ENCOUNTER — Ambulatory Visit: Payer: Medicare PPO | Admitting: Sports Medicine

## 2023-02-16 ENCOUNTER — Other Ambulatory Visit (INDEPENDENT_AMBULATORY_CARE_PROVIDER_SITE_OTHER): Payer: Medicare PPO

## 2023-02-16 ENCOUNTER — Encounter: Payer: Self-pay | Admitting: Sports Medicine

## 2023-02-16 DIAGNOSIS — M160 Bilateral primary osteoarthritis of hip: Secondary | ICD-10-CM

## 2023-02-16 DIAGNOSIS — R2241 Localized swelling, mass and lump, right lower limb: Secondary | ICD-10-CM | POA: Diagnosis not present

## 2023-02-16 MED ORDER — TRIAMCINOLONE ACETONIDE 40 MG/ML IJ SUSP
80.0000 mg | Freq: Once | INTRAMUSCULAR | Status: AC
Start: 2023-02-16 — End: 2023-02-16
  Administered 2023-02-16: 80 mg via INTRAMUSCULAR

## 2023-02-16 NOTE — Addendum Note (Signed)
Addended by: Carren Rang A on: 02/16/2023 01:34 PM   Modules accepted: Orders

## 2023-02-16 NOTE — Assessment & Plan Note (Signed)
Bilateral hip osteoarthritis noted on x-rays, not better with conservative treatment including meloxicam and home PT, today we did bilateral hip joint injections, return to see me in 6 weeks, referral for hip arthroplasty if not better.

## 2023-02-16 NOTE — Progress Notes (Signed)
    Procedures performed today:    Procedure: Real-time Ultrasound Guided injection of the left hip joint Device: Samsung HS60  Verbal informed consent obtained.  Time-out conducted.  Noted no overlying erythema, induration, or other signs of local infection.  Skin prepped in a sterile fashion.  Local anesthesia: Topical Ethyl chloride.  With sterile technique and under real time ultrasound guidance: Arthritic joint noted, 1 cc Kenalog 40, 2 cc lidocaine, 2 cc bupivacaine injected easily Completed without difficulty  Advised to call if fevers/chills, erythema, induration, drainage, or persistent bleeding.  Images permanently stored and available for review in PACS.  Impression: Technically successful ultrasound guided injection.   Procedure: Real-time Ultrasound Guided injection of the right hip joint Device: Samsung HS60  Verbal informed consent obtained.  Time-out conducted.  Noted no overlying erythema, induration, or other signs of local infection.  Skin prepped in a sterile fashion.  Local anesthesia: Topical Ethyl chloride.  With sterile technique and under real time ultrasound guidance: Arthritic joint noted, 1 cc Kenalog 40, 2 cc lidocaine, 2 cc bupivacaine injected easily. I also noted a subcutaneous mass isoechoic with the surrounding subcutaneous tissues over the anterior hip joint, suspect lipoma. Completed without difficulty  Advised to call if fevers/chills, erythema, induration, drainage, or persistent bleeding.  Images permanently stored and available for review in PACS.  Impression: Technically successful ultrasound guided injection.  Also noted a mass anterior to the hip joint.  Independent interpretation of notes and tests performed by another provider:   None.  Brief History, Exam, Impression, and Recommendations:    Primary osteoarthritis of both hips Bilateral hip osteoarthritis noted on x-rays, not better with conservative treatment including meloxicam  and home PT, today we did bilateral hip joint injections, return to see me in 6 weeks, referral for hip arthroplasty if not better.  Mass of hip region, right Incidentally noted subcutaneous mass overlying the right hip joint, right hip joint does not fact hurt more than the left. On ultrasound this did appear isoechoic with the surrounding subcutaneous tissues suspicious for a lipoma. Considering its size I would like an MRI with and without IV contrast. For insurance coverage purposes this has been present for greater than 6 weeks and ultrasound has already been done.    ____________________________________________ Ihor Austin. [name], M.D., ABFM., CAQSM., AME. Primary Care and Sports Medicine [facility]  Adjunct Professor of Family Medicine  [place] [employer] [facility] [employer] [facility] [employer] [employer]  Restaurant manager, fast food

## 2023-02-16 NOTE — Assessment & Plan Note (Signed)
Incidentally noted subcutaneous mass overlying the right hip joint, right hip joint does not fact hurt more than the left. On ultrasound this did appear isoechoic with the surrounding subcutaneous tissues suspicious for a lipoma. Considering its size I would like an MRI with and without IV contrast. For insurance coverage purposes this has been present for greater than 6 weeks and ultrasound has already been done.

## 2023-02-17 ENCOUNTER — Encounter: Payer: Self-pay | Admitting: Family Medicine

## 2023-02-18 ENCOUNTER — Encounter: Payer: Self-pay | Admitting: Family Medicine

## 2023-02-23 LAB — ANTI-TPO AB (RDL): Anti-TPO Ab (RDL): <9 [IU]/mL (ref <9.0)

## 2023-03-07 ENCOUNTER — Ambulatory Visit (HOSPITAL_COMMUNITY)
Admission: RE | Admit: 2023-03-07 | Discharge: 2023-03-07 | Disposition: A | Discharge status: Home / Self Care | Payer: Medicare PPO | Source: Ambulatory Visit | Attending: Sports Medicine | Admitting: Sports Medicine

## 2023-03-07 DIAGNOSIS — R2241 Localized swelling, mass and lump, right lower limb: Secondary | ICD-10-CM | POA: Insufficient documentation

## 2023-03-07 DIAGNOSIS — D1723 Benign lipomatous neoplasm of skin and subcutaneous tissue of right leg: Secondary | ICD-10-CM | POA: Diagnosis not present

## 2023-03-07 MED ORDER — GADOBUTROL 1 MMOL/ML IV SOLN
6.0000 mL | Freq: Once | INTRAVENOUS | Status: AC | PRN
  Administered 2023-03-07: 6 mL via INTRAVENOUS

## 2023-03-15 ENCOUNTER — Encounter: Payer: Self-pay | Admitting: Sports Medicine

## 2023-05-02 ENCOUNTER — Other Ambulatory Visit: Payer: Self-pay | Admitting: Family Medicine

## 2023-05-02 DIAGNOSIS — F3342 Major depressive disorder, recurrent, in full remission: Secondary | ICD-10-CM

## 2023-05-02 MED ORDER — ESCITALOPRAM OXALATE 10 MG PO TABS: 10.0000 mg | ORAL_TABLET | Freq: Every day | ORAL | 0 refills | Status: DC

## 2023-05-02 NOTE — Telephone Encounter (Signed)
Copied from CRM [id number]. Topic: Clinical - Medication Refill >> May 02, 2023 12:02 PM Maxwell Marion wrote: Most Recent Primary Care Visit:  Provider: Monica Becton  Department: [facility]  Visit Type: OFFICE VISIT  Date: 02/16/2023  Medication: escitalopram (LEXAPRO) 10 MG  Has the patient contacted their pharmacy? Yes (Agent: If no, request that the patient contact the pharmacy for the refill. If patient does not wish to contact the pharmacy document the reason why and proceed with request.) (Agent: If yes, when and what did the pharmacy advise?)  Is this the correct pharmacy for this prescription? Yes If no, delete pharmacy and type the correct one.  This is the patient's preferred pharmacy:  [facility] [phone] - [place], [place] - [address] [place] [address] Phone: [phone] Fax: [phone]    Has the prescription been filled recently?   Is the patient out of the medication? No  Has the patient been seen for an appointment in the last year OR does the patient have an upcoming appointment? Yes  Can we respond through MyChart? Yes  Agent: Please be advised that Rx refills may take up to 3 business days. We ask that you follow-up with your pharmacy.

## 2023-05-10 ENCOUNTER — Ambulatory Visit: Payer: Medicare PPO

## 2023-05-10 ENCOUNTER — Other Ambulatory Visit: Payer: Self-pay | Admitting: Sports Medicine

## 2023-05-10 ENCOUNTER — Ambulatory Visit: Payer: Medicare PPO | Admitting: Sports Medicine

## 2023-05-10 DIAGNOSIS — M7062 Trochanteric bursitis, left hip: Secondary | ICD-10-CM

## 2023-05-10 DIAGNOSIS — M7061 Trochanteric bursitis, right hip: Secondary | ICD-10-CM

## 2023-05-10 DIAGNOSIS — M160 Bilateral primary osteoarthritis of hip: Secondary | ICD-10-CM

## 2023-05-10 DIAGNOSIS — M25552 Pain in left hip: Secondary | ICD-10-CM | POA: Diagnosis not present

## 2023-05-10 DIAGNOSIS — M25551 Pain in right hip: Secondary | ICD-10-CM | POA: Diagnosis not present

## 2023-05-10 MED ORDER — DICLOFENAC SODIUM 1 % EX GEL: 4.0000 g | Freq: Four times a day (QID) | CUTANEOUS | 11 refills | Status: DC

## 2023-05-10 NOTE — Assessment & Plan Note (Signed)
Hip joint injection performed bilaterally in October, she is doing well from a hip arthritis perspective, no pain in the groin and no pain with internal rotation.  She does have significant pain laterally.

## 2023-05-10 NOTE — Assessment & Plan Note (Signed)
 Having recurrence of bilateral hip pain, her last set of injections was in December 2023 . She also has profoundly weak hip abductors, I discussed the anatomy and pathophysiology, she will continue Tylenol  and other analgesics, we will add topical Voltaren  to be applied 4 times daily, updated x-rays and formal PT.

## 2023-05-10 NOTE — Progress Notes (Signed)
    Procedures performed today:    None.  Independent interpretation of notes and tests performed by another provider:   None.  Brief History, Exam, Impression, and Recommendations:    Primary osteoarthritis of both hips Hip joint injection performed bilaterally in October, she is doing well from a hip arthritis perspective, no pain in the groin and no pain with internal rotation.  She does have significant pain laterally.  Trochanteric bursitis of both hips Having recurrence of bilateral hip pain, her last set of injections was in December 2023 . She also has profoundly weak hip abductors, I discussed the anatomy and pathophysiology, she will continue Tylenol  and other analgesics, we will add topical Voltaren  to be applied 4 times daily, updated x-rays and formal PT.    ____________________________________________ Debby PARAS. Curtis, M.D., ABFM., CAQSM., AME. Primary Care and Sports Medicine [facility]  Adjunct Professor of [facility] Medicine  [employer]  [employer], [employer]

## 2023-05-11 ENCOUNTER — Other Ambulatory Visit: Payer: Self-pay | Admitting: Family Medicine

## 2023-05-17 NOTE — Therapy (Signed)
 OUTPATIENT PHYSICAL THERAPY LOWER EXTREMITY EVALUATION   Patient Name: Amanda Schmitt MRN: [id number] DOB:28-Jul-1945, 78 y.o., female Today's Date: 05/18/2023  END OF SESSION:  PT End of Session - 05/18/23 1447     Visit Number 1    Number of Visits 10    Date for PT Re-Evaluation 06/29/23    Authorization Type [employer] medicare    Authorization Time Period auth tbd    PT Start Time 1448    PT Stop Time 1534    PT Time Calculation (min) 46 min    Activity Tolerance Patient tolerated treatment well             Past Medical History:  Diagnosis Date   Depression    Past Surgical History:  Procedure Laterality Date   ABDOMINAL HYSTERECTOMY     ABDOMINAL HYSTERECTOMY     Bowel blockage     BOWEL RESECTION     CESAREAN SECTION     Patient Active Problem List   Diagnosis Date Noted   Mass of hip region, right 02/16/2023   Thyroid  function study abnormality 02/10/2023   Primary osteoarthritis of both hips 01/03/2023   Polyarthralgia with elevated CCP 01/03/2023   Visit for suture removal 09/27/2022   Right cervical radiculopathy 07/02/2022   Headache 06/14/2022   Neck swelling 03/01/2022   Elevated blood-pressure reading without diagnosis of hypertension 01/11/2022   B12 deficiency 01/11/2022   Well adult exam 07/08/2021   Recurrent UTI 09/23/2020   Lumbar spondylosis 06/30/2020   Trochanteric bursitis of both hips 04/15/2020   GAD (generalized anxiety disorder) 10/16/2019   Trigeminal neuralgia 09/27/2019   Palpitations 09/27/2019   Epigastric pain 09/27/2019   Primary insomnia 06/22/2016   Osteopenia of multiple sites 08/11/2015   Mixed hyperlipidemia 08/11/2015   Major depressive disorder in full remission (HCC) 08/11/2015   Senile nuclear sclerosis 07/25/2014    PCP: Alvia Bring, DO  REFERRING PROVIDER: Curtis Debby PARAS, MD  REFERRING DIAG: M70.61,M70.62 (ICD-10-CM) - Trochanteric bursitis of both hips  THERAPY DIAG:  Pain of both hip  joints  Muscle weakness (generalized)  Rationale for Evaluation and Treatment: Rehabilitation  ONSET DATE: about a month ago  SUBJECTIVE:   SUBJECTIVE STATEMENT: Pt states she has had prior issues with low back and hip bursitis, states this pain feels distinct and began about a month ago. She states she tends to do well while moving but has pain with prolonged sitting and with transfers after prolonged sitting, difficulty sleeping (side sleeper). Describes pulling in lateral and anterior hips R>L. Pt states she tends to have a lot of morning pain/stiffness, also worst at end of day particularly when more active. States she feels weak in her hips. She does perform stretching and essential oils, has an HEP from prior bouts of PT but states she hasn't performed much.  Denies any N/T, fevers/night sweats, incontinence, or unintentional weight loss.    PERTINENT HISTORY: Depression, R hip mass (rectus femoris)  PAIN:  Are you having pain: 2/10 Location/description: anterior hips BIL R>L Best-worst over past week: 0-8/10  - aggravating factors: driving, prolonged sitting, transfers - Easing factors: movement, medication, heating pad     PRECAUTIONS: None  WEIGHT BEARING RESTRICTIONS: No  FALLS:  Has patient fallen in last 6 months? Yes. Number of falls 1 fall in May 2024, fell down a flight of stairs at [employer], slipped  LIVING ENVIRONMENT: 1 level home, no STE Lives alone, does housework, has help with yardwork   OCCUPATION: retired -  former runner, broadcasting/film/video  PLOF: Independent - enjoys reading, movies, puzzles   PATIENT GOALS: improve sleep  NEXT MD VISIT: 06/28/23  OBJECTIVE:  Note: Objective measures were completed at Evaluation unless otherwise noted.  DIAGNOSTIC FINDINGS:  R hip MRI 03/31/23 IMPRESSION: 1. A 7.1 x 3.6 x 6.6 cm lipomatous mass within the right rectus femoris muscle without enhancing septations or enhancing soft tissue nodule most consistent with a simple  lipoma. 2. Moderate partial-thickness cartilage loss of the posterosuperior right femoral head and acetabulum with subchondral marrow edema in the femoral head.  BIL Hip XR 05/10/23; no read yet in EPIC at time of eval  PATIENT SURVEYS:  FOTO 71 > 73  COGNITION: Overall cognitive status: Within functional limits for tasks assessed     SENSATION: LT intact BIL LE   LOWER EXTREMITY ROM:     ROM Right eval Left eval  Hip flexion    Hip extension    Hip internal rotation    Hip external rotation    Knee extension    Knee flexion    (Blank rows = not tested) (Key: WFL = within functional limits not formally assessed, * = concordant pain, s = stiffness/stretching sensation, NT = not tested)  Comments: mild R hip stiffness into IR compared to L. ER WNL and painless. FABER negative BIL, FADDIR mildly positive R compared to L. Lumbar rotation AROM symmetrical and painless  LOWER EXTREMITY MMT:    MMT Right eval Left eval  Hip flexion 4- * 4  Hip abduction (modified sitting) 4 4  Hip internal rotation 4 - * 4  Hip external rotation 4 4  Knee flexion 5 5  Knee extension 5 5  Ankle dorsiflexion     (Blank rows = not tested) (Key: WFL = within functional limits not formally assessed, * = concordant pain, s = stiffness/stretching sensation, NT = not tested)  Comments:    LOWER EXTREMITY SPECIAL TESTS:  FABER/FADDIR as above  FUNCTIONAL TESTS:  5xSTS: 11.35 sec gentle UE support from thighs. Tendency for inc weight shift to R on descent  GAIT: Distance walked: within clinic Assistive device utilized: None Level of assistance: Complete Independence Comments: reduced truncal rotation and arm swing, reduced hip extension                                                                                                                                TREATMENT DATE:  [facility] Adult PT Treatment:                                                DATE: 05/18/23 Therapeutic  Exercise: SLR x8 BIL Hip abduction practice reps Standing triple flex/ext at counter x8 BIL HEP handout + education, relevant anatomy/physiology, rationale for interventions   PATIENT EDUCATION:  Education details: Pt education  on PT impairments, prognosis, and POC. Informed consent. Rationale for interventions, safe/appropriate HEP performance Person educated: Patient Education method: Explanation, Demonstration, Tactile cues, Verbal cues, and Handouts Education comprehension: verbalized understanding, returned demonstration, verbal cues required, tactile cues required, and needs further education    HOME EXERCISE PROGRAM: Access Code: [email or web address] URL: [email or web address] Date: 05/18/2023 Prepared by: Alm Jenny  Program Notes - with marches, please do flexion into extension (moving leg backwards) as done in clinic  Exercises - Small Range Straight Leg Raise  - 2-3 x daily - 1 sets - 8 reps - Sidelying Hip Abduction  - 2-3 x daily - 1 sets - 8 reps - Standing March with Counter Support  - 2-3 x daily - 1 sets - 5 reps  ASSESSMENT:  CLINICAL IMPRESSION: Patient is a pleasant 78 y.o. woman who was seen today for physical therapy evaluation and treatment for BIL hip pain ongoing since beginning of December. She endorses morning/evening pain and stiffness, difficulty with prolonged positioning and pain in anterior hips with STS after prolonged sitting. On exam she demonstrates good lumbar/hip mobility overall, mildly positive FADDIR on L. Reduced hip strength R more so than L. 5xSTS is outside of fall risk category but pt does demo altered kinematics. Tolerates HEP and exam well without adverse event, primary report of muscular fatigue with HEP. Recommend trial of skilled PT to address aforementioned deficits with aim of improving functional tolerance and reducing pain with typical activities. Pt departs today's session in no acute distress, all voiced concerns/questions  addressed appropriately from PT perspective.    OBJECTIVE IMPAIRMENTS: decreased activity tolerance, decreased endurance, decreased mobility, decreased ROM, decreased strength, improper body mechanics, and pain.   ACTIVITY LIMITATIONS: sitting, standing, sleeping, and transfers  PARTICIPATION LIMITATIONS: meal prep, cleaning, laundry, driving, and community activity  PERSONAL FACTORS: Age, Time since onset of injury/illness/exacerbation, and 1 comorbidity: depression  are also affecting patient's functional outcome.   REHAB POTENTIAL: Good  CLINICAL DECISION MAKING: Stable/uncomplicated  EVALUATION COMPLEXITY: Low   GOALS:  SHORT TERM GOALS: Target date: 06/08/2023 Pt will demonstrate appropriate understanding and performance of initially prescribed HEP in order to facilitate improved independence with management of symptoms.  Baseline: HEP provided on eval Goal status: INITIAL   2. Pt will report at least 25% decrease in overall pain levels in past week in order to facilitate improved tolerance to basic ADLs/mobility.   Baseline: 0-8/10  Goal status: INITIAL    LONG TERM GOALS: Target date: 06/29/2023 Pt will score 73 or greater on FOTO in order to demonstrate improved perception of function due to symptoms.  Baseline: 71 Goal status: INITIAL  2.  Pt will demonstrate at least 4+/5 global hip MMT bilaterally in order to facilitate improved functional strength. Baseline: see MMT chart above Goal status: INITIAL  3.  Pt will report at least 50% decrease in overall pain levels in past week in order to facilitate improved tolerance to basic ADLs/mobility.   Baseline: 0-8/10  Goal status: INITIAL    4.  Pt will be able to perform 5xSTS in less than or equal to 9 sec w/o UE support in order to demonstrate reduced fall risk and improved functional independence (MCID 5xSTS = 2.3 sec). Baseline: 11sec w gentle UE support Goal status: INITIAL   5. Pt will demonstrate appropriate  performance of final prescribed HEP in order to facilitate improved self-management of symptoms post-discharge.   Baseline: initial HEP prescribed  Goal status: INITIAL     PLAN:  PT FREQUENCY:  1-2x/week (plan for 2x/week initially with taper to 1x/week as appropriate)  PT DURATION: 6 weeks  PLANNED INTERVENTIONS: 97164- PT Re-evaluation, 97110-Therapeutic exercises, 97530- Therapeutic activity, 97112- Neuromuscular re-education, 97535- Self Care, [id number]- Manual therapy, 97116- Gait training, 97014- Electrical stimulation (unattended), Patient/Family education, Balance training, Stair training, Taping, Dry Needling, Joint mobilization, Spinal mobilization, Cryotherapy, and Moist heat  PLAN FOR NEXT SESSION: Review/update HEP PRN. Work on [employer] exercises as appropriate with emphasis on rotational hip strength/endurance, hip flexor strength and mobility. Improving symmetry with functional movements. Symptom modification strategies as indicated/appropriate.    Alm DELENA Jenny PT, DPT 05/18/2023 4:00 PM    Referring diagnosis? M70.61,M70.62 (ICD-10-CM) - Trochanteric bursitis of both hips  Treatment diagnosis? (if different than referring diagnosis) Pain of both hip joints  Muscle weakness (generalized) What was this (referring dx) caused by? []  Surgery []  Fall [x]  Ongoing issue []  Arthritis []  Other: ____________  Laterality: []  Rt []  Lt [x]  Both  Check all possible CPT codes:  *CHOOSE 10 OR LESS*    See Planned Interventions listed in the Plan section of the Evaluation.

## 2023-05-18 ENCOUNTER — Encounter: Payer: Self-pay | Admitting: Physical Therapy

## 2023-05-18 ENCOUNTER — Ambulatory Visit: Payer: Medicare PPO | Attending: Sports Medicine | Admitting: Physical Therapy

## 2023-05-18 DIAGNOSIS — M25552 Pain in left hip: Secondary | ICD-10-CM | POA: Insufficient documentation

## 2023-05-18 DIAGNOSIS — M7062 Trochanteric bursitis, left hip: Secondary | ICD-10-CM | POA: Diagnosis not present

## 2023-05-18 DIAGNOSIS — M7061 Trochanteric bursitis, right hip: Secondary | ICD-10-CM | POA: Insufficient documentation

## 2023-05-18 DIAGNOSIS — M6281 Muscle weakness (generalized): Secondary | ICD-10-CM | POA: Diagnosis not present

## 2023-05-18 DIAGNOSIS — M25551 Pain in right hip: Secondary | ICD-10-CM | POA: Diagnosis not present

## 2023-05-23 ENCOUNTER — Ambulatory Visit: Payer: Medicare PPO | Admitting: Physical Therapy

## 2023-05-23 ENCOUNTER — Encounter: Payer: Self-pay | Admitting: Physical Therapy

## 2023-05-23 DIAGNOSIS — M7061 Trochanteric bursitis, right hip: Secondary | ICD-10-CM | POA: Diagnosis not present

## 2023-05-23 DIAGNOSIS — M25551 Pain in right hip: Secondary | ICD-10-CM | POA: Diagnosis not present

## 2023-05-23 DIAGNOSIS — M6281 Muscle weakness (generalized): Secondary | ICD-10-CM | POA: Diagnosis not present

## 2023-05-23 DIAGNOSIS — M25552 Pain in left hip: Secondary | ICD-10-CM | POA: Diagnosis not present

## 2023-05-23 DIAGNOSIS — M7062 Trochanteric bursitis, left hip: Secondary | ICD-10-CM | POA: Diagnosis not present

## 2023-05-23 NOTE — Therapy (Signed)
 OUTPATIENT PHYSICAL THERAPY TREATMENT   Patient Name: JANIYAH BEERY MRN: [id number] DOB:1945-12-03, 78 y.o., female Today's Date: 05/23/2023  END OF SESSION:  PT End of Session - 05/23/23 1532     Visit Number 2    Number of Visits 10    Date for PT Re-Evaluation 06/29/23    Authorization Type [employer]    Authorization Time Period 12 visits approved for PT 05/18/2023-06/29/2023    Authorization - Visit Number 1    Authorization - Number of Visits 12    PT Start Time 1532    PT Stop Time 1615    PT Time Calculation (min) 43 min    Activity Tolerance Patient tolerated treatment well              Past Medical History:  Diagnosis Date   Depression    Past Surgical History:  Procedure Laterality Date   ABDOMINAL HYSTERECTOMY     ABDOMINAL HYSTERECTOMY     Bowel blockage     BOWEL RESECTION     CESAREAN SECTION     Patient Active Problem List   Diagnosis Date Noted   Mass of hip region, right 02/16/2023   Thyroid  function study abnormality 02/10/2023   Primary osteoarthritis of both hips 01/03/2023   Polyarthralgia with elevated CCP 01/03/2023   Visit for suture removal 09/27/2022   Right cervical radiculopathy 07/02/2022   Headache 06/14/2022   Neck swelling 03/01/2022   Elevated blood-pressure reading without diagnosis of hypertension 01/11/2022   B12 deficiency 01/11/2022   Well adult exam 07/08/2021   Recurrent UTI 09/23/2020   Lumbar spondylosis 06/30/2020   Trochanteric bursitis of both hips 04/15/2020   GAD (generalized anxiety disorder) 10/16/2019   Trigeminal neuralgia 09/27/2019   Palpitations 09/27/2019   Epigastric pain 09/27/2019   Primary insomnia 06/22/2016   Osteopenia of multiple sites 08/11/2015   Mixed hyperlipidemia 08/11/2015   Major depressive disorder in full remission (HCC) 08/11/2015   Senile nuclear sclerosis 07/25/2014    PCP: Alvia Bring, DO  REFERRING PROVIDER: Curtis Debby PARAS, MD  REFERRING DIAG:  M70.61,M70.62 (ICD-10-CM) - Trochanteric bursitis of both hips  THERAPY DIAG:  Pain of both hip joints  Muscle weakness (generalized)  Rationale for Evaluation and Treatment: Rehabilitation  ONSET DATE: about a month ago  SUBJECTIVE:  Per eval - Pt states she has had prior issues with low back and hip bursitis, states this pain feels distinct and began about a month ago. She states she tends to do well while moving but has pain with prolonged sitting and with transfers after prolonged sitting, difficulty sleeping (side sleeper). Describes pulling in lateral and anterior hips R>L. Pt states she tends to have a lot of morning pain/stiffness, also worst at end of day particularly when more active. States she feels weak in her hips. She does perform stretching and essential oils, has an HEP from prior bouts of PT but states she hasn't performed much.  Denies any N/T, fevers/night sweats, incontinence, or unintentional weight loss.   SUBJECTIVE STATEMENT: 05/23/2023 Pt states she felt good after initial eval and felt good through the weekend. Pt notes HEP has gone well, has had some transient pain with hip abduction exercises. No other new updates   PERTINENT HISTORY: Depression, R hip mass (rectus femoris)  PAIN:  Are you having pain: 0/10  Per eval -  Location/description: anterior hips BIL R>L Best-worst over past week: 0-8/10  - aggravating factors: driving, prolonged sitting, transfers - Easing factors: movement, medication,  heating pad     PRECAUTIONS: None  WEIGHT BEARING RESTRICTIONS: No  FALLS:  Has patient fallen in last 6 months? Yes. Number of falls 1 fall in May 2024, fell down a flight of stairs at [employer], slipped  LIVING ENVIRONMENT: 1 level home, no STE Lives alone, does housework, has help with yardwork   OCCUPATION: retired - former runner, broadcasting/film/video  PLOF: Independent - enjoys reading, movies, puzzles   PATIENT GOALS: improve sleep  NEXT MD VISIT:  06/28/23  OBJECTIVE:  Note: Objective measures were completed at Evaluation unless otherwise noted.  DIAGNOSTIC FINDINGS:  R hip MRI 03/31/23 IMPRESSION: 1. A 7.1 x 3.6 x 6.6 cm lipomatous mass within the right rectus femoris muscle without enhancing septations or enhancing soft tissue nodule most consistent with a simple lipoma. 2. Moderate partial-thickness cartilage loss of the posterosuperior right femoral head and acetabulum with subchondral marrow edema in the femoral head.  BIL Hip XR 05/10/23; no read yet in EPIC at time of eval  PATIENT SURVEYS:  FOTO 71 > 73  COGNITION: Overall cognitive status: Within functional limits for tasks assessed     SENSATION: LT intact BIL LE   LOWER EXTREMITY ROM:     ROM Right eval Left eval  Hip flexion    Hip extension    Hip internal rotation    Hip external rotation    Knee extension    Knee flexion    (Blank rows = not tested) (Key: WFL = within functional limits not formally assessed, * = concordant pain, s = stiffness/stretching sensation, NT = not tested)  Comments: mild R hip stiffness into IR compared to L. ER WNL and painless. FABER negative BIL, FADDIR mildly positive R compared to L. Lumbar rotation AROM symmetrical and painless  LOWER EXTREMITY MMT:    MMT Right eval Left eval  Hip flexion 4- * 4  Hip abduction (modified sitting) 4 4  Hip internal rotation 4 - * 4  Hip external rotation 4 4  Knee flexion 5 5  Knee extension 5 5  Ankle dorsiflexion     (Blank rows = not tested) (Key: WFL = within functional limits not formally assessed, * = concordant pain, s = stiffness/stretching sensation, NT = not tested)  Comments:    LOWER EXTREMITY SPECIAL TESTS:  FABER/FADDIR as above  FUNCTIONAL TESTS:  5xSTS: 11.35 sec gentle UE support from thighs. Tendency for inc weight shift to R on descent  GAIT: Distance walked: within clinic Assistive device utilized: None Level of assistance: Complete  Independence Comments: reduced truncal rotation and arm swing, reduced hip extension                                                                                                                                TREATMENT DATE:  [facility] Adult PT Treatment:  DATE: 05/23/23 Therapeutic Exercise: Hip abduction 2x8 BIL cues for foot positioning SLR 2x8 BIL cues for foot positioning Hooklying swiss ball press down 2x8 cues for breath control and setup Bridge 2x12 STS 2x5 BW cues for increased ascent velocity for glute engagement, controlled descent STS 5# 2x5 cues for pacing  Standing triple flex<>ext x12 BIL LE  Standing hip openers/closers 3x5 BIL cues for form, posture, and sequencing  Verbal HEP review/discussion, relevant anatomy/physiology, rationale for interventions   [facility] Adult PT Treatment:                                                DATE: 05/18/23 Therapeutic Exercise: SLR x8 BIL Hip abduction practice reps Standing triple flex/ext at counter x8 BIL HEP handout + education, relevant anatomy/physiology, rationale for interventions   PATIENT EDUCATION:  Education details: rationale for interventions, HEP  Person educated: Patient Education method: Explanation, Demonstration, Tactile cues, Verbal cues Education comprehension: verbalized understanding, returned demonstration, verbal cues required, tactile cues required, and needs further education     HOME EXERCISE PROGRAM: Access Code: [email or web address] URL: [email or web address] Date: 05/18/2023 Prepared by: Alm Jenny  Program Notes - with marches, please do flexion into extension (moving leg backwards) as done in clinic  Exercises - Small Range Straight Leg Raise  - 2-3 x daily - 1 sets - 8 reps - Sidelying Hip Abduction  - 2-3 x daily - 1 sets - 8 reps - Standing March with Counter Support  - 2-3 x daily - 1 sets - 5 reps  ASSESSMENT:  CLINICAL  IMPRESSION: 05/23/2023 Pt arrives w/o pain, states she has felt good overall since initial eval. Today continuing to expand program focusing on LE strength in open and closed chain, emphasis on active hip mobility/endurance. Cues as above, tolerates well overall. No adverse events, pt primarily limited by muscular fatigue and denies any pain on departure. Recommend continuing along current POC in order to address relevant deficits and improve functional tolerance. Pt departs today's session in no acute distress, all voiced questions/concerns addressed appropriately from PT perspective.    Per eval - Patient is a pleasant 78 y.o. woman who was seen today for physical therapy evaluation and treatment for BIL hip pain ongoing since beginning of December. She endorses morning/evening pain and stiffness, difficulty with prolonged positioning and pain in anterior hips with STS after prolonged sitting. On exam she demonstrates good lumbar/hip mobility overall, mildly positive FADDIR on L. Reduced hip strength R more so than L. 5xSTS is outside of fall risk category but pt does demo altered kinematics. Tolerates HEP and exam well without adverse event, primary report of muscular fatigue with HEP. Recommend trial of skilled PT to address aforementioned deficits with aim of improving functional tolerance and reducing pain with typical activities. Pt departs today's session in no acute distress, all voiced concerns/questions addressed appropriately from PT perspective.    OBJECTIVE IMPAIRMENTS: decreased activity tolerance, decreased endurance, decreased mobility, decreased ROM, decreased strength, improper body mechanics, and pain.   ACTIVITY LIMITATIONS: sitting, standing, sleeping, and transfers  PARTICIPATION LIMITATIONS: meal prep, cleaning, laundry, driving, and community activity  PERSONAL FACTORS: Age, Time since onset of injury/illness/exacerbation, and 1 comorbidity: depression  are also affecting patient's  functional outcome.   REHAB POTENTIAL: Good  CLINICAL DECISION MAKING: Stable/uncomplicated  EVALUATION COMPLEXITY: Low   GOALS:  SHORT TERM GOALS: Target date:  06/08/2023 Pt will demonstrate appropriate understanding and performance of initially prescribed HEP in order to facilitate improved independence with management of symptoms.  Baseline: HEP provided on eval Goal status: INITIAL   2. Pt will report at least 25% decrease in overall pain levels in past week in order to facilitate improved tolerance to basic ADLs/mobility.   Baseline: 0-8/10  Goal status: INITIAL    LONG TERM GOALS: Target date: 06/29/2023 Pt will score 73 or greater on FOTO in order to demonstrate improved perception of function due to symptoms.  Baseline: 71 Goal status: INITIAL  2.  Pt will demonstrate at least 4+/5 global hip MMT bilaterally in order to facilitate improved functional strength. Baseline: see MMT chart above Goal status: INITIAL  3.  Pt will report at least 50% decrease in overall pain levels in past week in order to facilitate improved tolerance to basic ADLs/mobility.   Baseline: 0-8/10  Goal status: INITIAL    4.  Pt will be able to perform 5xSTS in less than or equal to 9 sec w/o UE support in order to demonstrate reduced fall risk and improved functional independence (MCID 5xSTS = 2.3 sec). Baseline: 11sec w gentle UE support Goal status: INITIAL   5. Pt will demonstrate appropriate performance of final prescribed HEP in order to facilitate improved self-management of symptoms post-discharge.   Baseline: initial HEP prescribed  Goal status: INITIAL     PLAN:  PT FREQUENCY: 1-2x/week (plan for 2x/week initially with taper to 1x/week as appropriate)  PT DURATION: 6 weeks  PLANNED INTERVENTIONS: 97164- PT Re-evaluation, 97110-Therapeutic exercises, 97530- Therapeutic activity, 97112- Neuromuscular re-education, 97535- Self Care, [id number]- Manual therapy, 97116- Gait training, 97014-  Electrical stimulation (unattended), Patient/Family education, Balance training, Stair training, Taping, Dry Needling, Joint mobilization, Spinal mobilization, Cryotherapy, and Moist heat  PLAN FOR NEXT SESSION: Review/update HEP PRN. Work on [employer] exercises as appropriate with emphasis on rotational hip strength/endurance, hip flexor strength and mobility. Improving symmetry with functional movements. Symptom modification strategies as indicated/appropriate.    Alm DELENA Jenny PT, DPT 05/23/2023 4:20 PM

## 2023-05-25 ENCOUNTER — Ambulatory Visit: Payer: Medicare PPO

## 2023-05-25 DIAGNOSIS — M25551 Pain in right hip: Secondary | ICD-10-CM

## 2023-05-25 DIAGNOSIS — M7062 Trochanteric bursitis, left hip: Secondary | ICD-10-CM | POA: Diagnosis not present

## 2023-05-25 DIAGNOSIS — M7061 Trochanteric bursitis, right hip: Secondary | ICD-10-CM | POA: Diagnosis not present

## 2023-05-25 DIAGNOSIS — M6281 Muscle weakness (generalized): Secondary | ICD-10-CM | POA: Diagnosis not present

## 2023-05-25 DIAGNOSIS — M25552 Pain in left hip: Secondary | ICD-10-CM | POA: Diagnosis not present

## 2023-05-25 NOTE — Therapy (Signed)
 OUTPATIENT PHYSICAL THERAPY TREATMENT   Patient Name: Amanda Schmitt MRN: [id number] DOB:12/25/1945, 78 y.o., female Today's Date: 05/25/2023  END OF SESSION:  PT End of Session - 05/25/23 1152     Visit Number 3    Number of Visits 10    Date for PT Re-Evaluation 06/29/23    Authorization Type [employer]    Authorization Time Period 12 visits approved for PT 05/18/2023-06/29/2023    Authorization - Visit Number 3    Authorization - Number of Visits 12    PT Start Time 1150    PT Stop Time 1230    PT Time Calculation (min) 40 min    Activity Tolerance Patient tolerated treatment well    Behavior During Therapy [facility] for tasks assessed/performed            Past Medical History:  Diagnosis Date   Depression    Past Surgical History:  Procedure Laterality Date   ABDOMINAL HYSTERECTOMY     ABDOMINAL HYSTERECTOMY     Bowel blockage     BOWEL RESECTION     CESAREAN SECTION     Patient Active Problem List   Diagnosis Date Noted   Mass of hip region, right 02/16/2023   Thyroid  function study abnormality 02/10/2023   Primary osteoarthritis of both hips 01/03/2023   Polyarthralgia with elevated CCP 01/03/2023   Visit for suture removal 09/27/2022   Right cervical radiculopathy 07/02/2022   Headache 06/14/2022   Neck swelling 03/01/2022   Elevated blood-pressure reading without diagnosis of hypertension 01/11/2022   B12 deficiency 01/11/2022   Well adult exam 07/08/2021   Recurrent UTI 09/23/2020   Lumbar spondylosis 06/30/2020   Trochanteric bursitis of both hips 04/15/2020   GAD (generalized anxiety disorder) 10/16/2019   Trigeminal neuralgia 09/27/2019   Palpitations 09/27/2019   Epigastric pain 09/27/2019   Primary insomnia 06/22/2016   Osteopenia of multiple sites 08/11/2015   Mixed hyperlipidemia 08/11/2015   Major depressive disorder in full remission (HCC) 08/11/2015   Senile nuclear sclerosis 07/25/2014    PCP: Adela Holter, DO  REFERRING  PROVIDER: Gean Keels, MD  REFERRING DIAG: M70.61,M70.62 (ICD-10-CM) - Trochanteric bursitis of both hips  THERAPY DIAG:  Pain of both hip joints  Muscle weakness (generalized)  Rationale for Evaluation and Treatment: Rehabilitation  ONSET DATE: about a month ago  SUBJECTIVE:  SUBJECTIVE STATEMENT: Patient reports mild pain in bilateral hips with R>L. Patient states overall pain is worse but it still "grabs" in the front of the hips.   PERTINENT HISTORY: Depression, R hip mass (rectus femoris)  PAIN:  Are you having pain: 0/10  Per eval -  Location/description: anterior hips BIL R>L Best-worst over past week: 0-8/10  - aggravating factors: driving, prolonged sitting, transfers - Easing factors: movement, medication, heating pad     PRECAUTIONS: None  WEIGHT BEARING RESTRICTIONS: No  FALLS:  Has patient fallen in last 6 months? Yes. Number of falls 1 fall in May 2024, fell down a flight of stairs at [employer], slipped  LIVING ENVIRONMENT: 1 level home, no STE Lives alone, does housework, has help with yardwork   OCCUPATION: retired - former Runner, broadcasting/film/video  PLOF: Independent - enjoys reading, movies, puzzles   PATIENT GOALS: improve sleep  NEXT MD VISIT: 06/28/23  OBJECTIVE:  Note: Objective measures were completed at Evaluation unless otherwise noted.  DIAGNOSTIC FINDINGS:  R hip MRI 03/31/23 "IMPRESSION: 1. A 7.1 x 3.6 x 6.6 cm lipomatous mass within the right rectus femoris muscle without  enhancing septations or enhancing soft tissue nodule most consistent with a simple lipoma. 2. Moderate partial-thickness cartilage loss of the posterosuperior right femoral head and acetabulum with subchondral marrow edema in the femoral head."  BIL Hip XR 05/10/23; no read yet in EPIC at time of eval  PATIENT SURVEYS:  FOTO 71 > 73  COGNITION: Overall cognitive status: Within functional limits for tasks assessed     SENSATION: LT intact BIL LE   LOWER  EXTREMITY ROM:     ROM Right eval Left eval  Hip flexion    Hip extension    Hip internal rotation    Hip external rotation    Knee extension    Knee flexion    (Blank rows = not tested) (Key: WFL = within functional limits not formally assessed, * = concordant pain, s = stiffness/stretching sensation, NT = not tested)  Comments: mild R hip stiffness into IR compared to L. ER WNL and painless. FABER negative BIL, FADDIR mildly positive R compared to L. Lumbar rotation AROM symmetrical and painless  LOWER EXTREMITY MMT:    MMT Right eval Left eval  Hip flexion 4- * 4  Hip abduction (modified sitting) 4 4  Hip internal rotation 4 - * 4  Hip external rotation 4 4  Knee flexion 5 5  Knee extension 5 5  Ankle dorsiflexion     (Blank rows = not tested) (Key: WFL = within functional limits not formally assessed, * = concordant pain, s = stiffness/stretching sensation, NT = not tested)  Comments:    LOWER EXTREMITY SPECIAL TESTS:  FABER/FADDIR as above  FUNCTIONAL TESTS:  5xSTS: 11.35 sec gentle UE support from thighs. Tendency for inc weight shift to R on descent  GAIT: Distance walked: within clinic Assistive device utilized: None Level of assistance: Complete Independence Comments: reduced truncal rotation and arm swing, reduced hip extension                                                                                                                               TREATMENT DATE:  [facility] Adult PT Treatment:                                          DATE: 05/25/2023 Therapeutic Exercise: Figure 4 LTR x10 (B) Thomas stretch 2x30" (B) Bridges --> figure 4 bridge Side Lying: Straight leg hip abd x10 Hip abd in IR x10 Small leg circles in abd x6 CW/CCW Piriformis stretch Prone: Quad stretch  Bent knee hip ext (HS cramping) Straight leg hip ext  Standing: HS curls from toe tap position Bent over hip ext from toe tap position   [facility] Adult PT Treatment:  DATE: 05/23/23 Therapeutic Exercise: Hip abduction 2x8 BIL cues for foot positioning SLR 2x8 BIL cues for foot positioning Hooklying swiss ball press down 2x8 cues for breath control and setup Bridge 2x12 STS 2x5 BW cues for increased ascent velocity for glute engagement, controlled descent STS 5# 2x5 cues for pacing  Standing triple flex<>ext x12 BIL LE  Standing hip openers/closers 3x5 BIL cues for form, posture, and sequencing  Verbal HEP review/discussion, relevant anatomy/physiology, rationale for interventions   [facility] Adult PT Treatment:                                                DATE: 05/18/23 Therapeutic Exercise: SLR x8 BIL Hip abduction practice reps Standing triple flex/ext at counter x8 BIL HEP handout + education, relevant anatomy/physiology, rationale for interventions   PATIENT EDUCATION:  Education details: rationale for interventions, HEP  Person educated: Patient Education method: Explanation, Demonstration, Tactile cues, Verbal cues Education comprehension: verbalized understanding, returned demonstration, verbal cues required, tactile cues required, and needs further education     HOME EXERCISE PROGRAM: Access Code: [email or web address] URL: [email or web address] Date: 05/25/2023 Prepared by: Sims Duck  Program Notes - with marches, please do flexion into extension (moving leg backwards) as done in clinic  Exercises - Small Range Straight Leg Raise  - 2-3 x daily - 1 sets - 8 reps - Sidelying Hip Abduction  - 2-3 x daily - 1 sets - 8 reps - Standing March with Counter Support  - 2-3 x daily - 1 sets - 5 reps - Supine Lower Trunk Rotation  - 1 x daily - 7 x weekly - 3 sets - 10 reps - Figure 4 Bridge  - 1 x daily - 7 x weekly - 3 sets - 10 reps - Seated Hip Flexor Stretch  - 2 x daily - 7 x weekly - 1 sets - 3 reps - 30 sec hold - Supine Piriformis Stretch with Leg Straight  - 2 x daily - 7 x weekly - 1 sets - 3 reps  - 30 sec hold - Prone Hip Extension on Table  - 1 x daily - 7 x weekly - 3 sets - 10 reps  ASSESSMENT:  CLINICAL IMPRESSION: Hip and glute strengthening exercises progressed as tolerated by patient. Increasing hamstring cramping during prone bent knee hip extension; patient tolerated straight leg variation. Hamstring strengthening continued with standing knee flexion, which patient tolerated well. HEP updated to progress LE strengthening/stretching.  OBJECTIVE IMPAIRMENTS: decreased activity tolerance, decreased endurance, decreased mobility, decreased ROM, decreased strength, improper body mechanics, and pain.   ACTIVITY LIMITATIONS: sitting, standing, sleeping, and transfers  PARTICIPATION LIMITATIONS: meal prep, cleaning, laundry, driving, and community activity  PERSONAL FACTORS: Age, Time since onset of injury/illness/exacerbation, and 1 comorbidity: depression  are also affecting patient's functional outcome.   REHAB POTENTIAL: Good  CLINICAL DECISION MAKING: Stable/uncomplicated  EVALUATION COMPLEXITY: Low   GOALS:  SHORT TERM GOALS: Target date: 06/08/2023 Pt will demonstrate appropriate understanding and performance of initially prescribed HEP in order to facilitate improved independence with management of symptoms.  Baseline: HEP provided on eval Goal status: INITIAL   2. Pt will report at least 25% decrease in overall pain levels in past week in order to facilitate improved tolerance to basic ADLs/mobility.   Baseline: 0-8/10  Goal status: INITIAL    LONG TERM GOALS: Target date: 06/29/2023 Pt  will score 73 or greater on FOTO in order to demonstrate improved perception of function due to symptoms.  Baseline: 71 Goal status: INITIAL  2.  Pt will demonstrate at least 4+/5 global hip MMT bilaterally in order to facilitate improved functional strength. Baseline: see MMT chart above Goal status: INITIAL  3.  Pt will report at least 50% decrease in overall pain levels in  past week in order to facilitate improved tolerance to basic ADLs/mobility.   Baseline: 0-8/10  Goal status: INITIAL    4.  Pt will be able to perform 5xSTS in less than or equal to 9 sec w/o UE support in order to demonstrate reduced fall risk and improved functional independence (MCID 5xSTS = 2.3 sec). Baseline: 11sec w gentle UE support Goal status: INITIAL   5. Pt will demonstrate appropriate performance of final prescribed HEP in order to facilitate improved self-management of symptoms post-discharge.   Baseline: initial HEP prescribed  Goal status: INITIAL     PLAN:  PT FREQUENCY: 1-2x/week (plan for 2x/week initially with taper to 1x/week as appropriate)  PT DURATION: 6 weeks  PLANNED INTERVENTIONS: 97164- PT Re-evaluation, 97110-Therapeutic exercises, 97530- Therapeutic activity, 97112- Neuromuscular re-education, 97535- Self Care, 60630- Manual therapy, 97116- Gait training, 97014- Electrical stimulation (unattended), Patient/Family education, Balance training, Stair training, Taping, Dry Needling, Joint mobilization, Spinal mobilization, Cryotherapy, and Moist heat  PLAN FOR NEXT SESSION: Review/update HEP PRN. Work on [employer] exercises as appropriate with emphasis on rotational hip strength/endurance, hip flexor strength and mobility. Improving symmetry with functional movements. Symptom modification strategies as indicated/appropriate.   Sims Duck, PTA 05/25/2023 12:30 PM

## 2023-05-30 ENCOUNTER — Ambulatory Visit: Payer: Medicare PPO | Admitting: Physical Therapy

## 2023-06-01 ENCOUNTER — Ambulatory Visit: Payer: Medicare PPO

## 2023-06-01 DIAGNOSIS — M7062 Trochanteric bursitis, left hip: Secondary | ICD-10-CM | POA: Diagnosis not present

## 2023-06-01 DIAGNOSIS — M6281 Muscle weakness (generalized): Secondary | ICD-10-CM | POA: Diagnosis not present

## 2023-06-01 DIAGNOSIS — M7061 Trochanteric bursitis, right hip: Secondary | ICD-10-CM | POA: Diagnosis not present

## 2023-06-01 DIAGNOSIS — M25552 Pain in left hip: Secondary | ICD-10-CM | POA: Diagnosis not present

## 2023-06-01 DIAGNOSIS — M25551 Pain in right hip: Secondary | ICD-10-CM

## 2023-06-01 NOTE — Therapy (Signed)
OUTPATIENT PHYSICAL THERAPY TREATMENT   Patient Name: Amanda Schmitt MRN: [id number] DOB:[birth year], 78 y.o., female Today's Date: 06/01/2023  END OF SESSION:  PT End of Session - 06/01/23 1102     Visit Number 4    Number of Visits 10    Date for PT Re-Evaluation 06/29/23    Authorization Type [employer]    Authorization Time Period 12 visits approved for PT 05/18/2023-06/29/2023    Authorization - Visit Number 4    Authorization - Number of Visits 12    PT Start Time 1102    PT Stop Time 1145    PT Time Calculation (min) 43 min    Activity Tolerance Patient tolerated treatment well    Behavior During Therapy WFL for tasks assessed/performed            Past Medical History:  Diagnosis Date   Depression    Past Surgical History:  Procedure Laterality Date   ABDOMINAL HYSTERECTOMY     ABDOMINAL HYSTERECTOMY     Bowel blockage     BOWEL RESECTION     CESAREAN SECTION     Patient Active Problem List   Diagnosis Date Noted   Mass of hip region, right 02/16/2023   Thyroid function study abnormality 02/10/2023   Primary osteoarthritis of both hips 01/03/2023   Polyarthralgia with elevated CCP 01/03/2023   Visit for suture removal 09/27/2022   Right cervical radiculopathy 07/02/2022   Headache 06/14/2022   Neck swelling 03/01/2022   Elevated blood-pressure reading without diagnosis of hypertension 01/11/2022   B12 deficiency 01/11/2022   Well adult exam 07/08/2021   Recurrent UTI 09/23/2020   Lumbar spondylosis 06/30/2020   Trochanteric bursitis of both hips 04/15/2020   GAD (generalized anxiety disorder) 10/16/2019   Trigeminal neuralgia 09/27/2019   Palpitations 09/27/2019   Epigastric pain 09/27/2019   Primary insomnia 06/22/2016   Osteopenia of multiple sites 08/11/2015   Mixed hyperlipidemia 08/11/2015   Major depressive disorder in full remission (HCC) 08/11/2015   Senile nuclear sclerosis 07/25/2014    PCP: Everrett Coombe, DO  REFERRING  PROVIDER: Monica Becton, MD  REFERRING DIAG: M70.61,M70.62 (ICD-10-CM) - Trochanteric bursitis of both hips  THERAPY DIAG:  Pain of both hip joints  Muscle weakness (generalized)  Rationale for Evaluation and Treatment: Rehabilitation  ONSET DATE: about a month ago  SUBJECTIVE:  SUBJECTIVE STATEMENT: Patient reports pain in hips is getting better; states today pain is greater in front of L hip as compared to right.   PERTINENT HISTORY: Depression, R hip mass (rectus femoris)  PAIN:  Are you having pain: 0/10  Per eval -  Location/description: anterior hips BIL R>L Best-worst over past week: 0-8/10  - aggravating factors: driving, prolonged sitting, transfers - Easing factors: movement, medication, heating pad     PRECAUTIONS: None  WEIGHT BEARING RESTRICTIONS: No  FALLS:  Has patient fallen in last 6 months? Yes. Number of falls 1 fall in May 2024, fell down a flight of stairs at [employer], slipped  LIVING ENVIRONMENT: 1 level home, no STE Lives alone, does housework, has help with yardwork   OCCUPATION: retired - former Runner, broadcasting/film/video  PLOF: Independent - enjoys reading, movies, puzzles   PATIENT GOALS: improve sleep  NEXT MD VISIT: 06/28/23  OBJECTIVE:  Note: Objective measures were completed at Evaluation unless otherwise noted.  DIAGNOSTIC FINDINGS:  R hip MRI 03/31/23 "IMPRESSION: 1. A 7.1 x 3.6 x 6.6 cm lipomatous mass within the right rectus femoris muscle without enhancing septations or  enhancing soft tissue nodule most consistent with a simple lipoma. 2. Moderate partial-thickness cartilage loss of the posterosuperior right femoral head and acetabulum with subchondral marrow edema in the femoral head."  BIL Hip XR 05/10/23; no read yet in EPIC at time of eval  PATIENT SURVEYS:  FOTO 71 > 73  COGNITION: Overall cognitive status: Within functional limits for tasks assessed     SENSATION: LT intact BIL LE   LOWER EXTREMITY ROM:      ROM Right eval Left eval  Hip flexion    Hip extension    Hip internal rotation    Hip external rotation    Knee extension    Knee flexion    (Blank rows = not tested) (Key: WFL = within functional limits not formally assessed, * = concordant pain, s = stiffness/stretching sensation, NT = not tested)  Comments: mild R hip stiffness into IR compared to L. ER WNL and painless. FABER negative BIL, FADDIR mildly positive R compared to L. Lumbar rotation AROM symmetrical and painless  LOWER EXTREMITY MMT:    MMT Right eval Left eval  Hip flexion 4- * 4  Hip abduction (modified sitting) 4 4  Hip internal rotation 4 - * 4  Hip external rotation 4 4  Knee flexion 5 5  Knee extension 5 5  Ankle dorsiflexion     (Blank rows = not tested) (Key: WFL = within functional limits not formally assessed, * = concordant pain, s = stiffness/stretching sensation, NT = not tested)  Comments:    LOWER EXTREMITY SPECIAL TESTS:  FABER/FADDIR as above  FUNCTIONAL TESTS:  5xSTS: 11.35 sec gentle UE support from thighs. Tendency for inc weight shift to R on descent  GAIT: Distance walked: within clinic Assistive device utilized: None Level of assistance: Complete Independence Comments: reduced truncal rotation and arm swing, reduced hip extension                                                                                                                               TREATMENT DATE:  [facility] Adult PT Treatment:                                                DATE: 06/01/2023 Therapeutic Exercise: Maisie Fus stretch 2x30" (B) Small range SLR x15 (B) Quadruped hip extension Standing glute med press with ball into wall Seated figure 4 stretch Figure 4 bridge --> unable to tolerate with R leg bent d/t glute pain Self-massage with 4"ball for myofascial release to R glute Bridges --> staggered stance bridge Bent over hip extension Bent over hip extension on diagonal Resisted side stepping + GTB  crossed at ankles  Standing resisted hip abd + RTB crossed at ankles 2x10 (B)   [facility] Adult PT Treatment:  DATE: 05/25/2023 Therapeutic Exercise: Figure 4 LTR x10 (B) Thomas stretch 2x30" (B) Bridges --> figure 4 bridge Side Lying: Straight leg hip abd x10 Hip abd in IR x10 Small leg circles in abd x6 CW/CCW Piriformis stretch Prone: Quad stretch  Bent knee hip ext (HS cramping) Straight leg hip ext  Standing: HS curls from toe tap position Bent over hip ext from toe tap position   [facility] Adult PT Treatment:                                                DATE: 05/23/23 Therapeutic Exercise: Hip abduction 2x8 BIL cues for foot positioning SLR 2x8 BIL cues for foot positioning Hooklying swiss ball press down 2x8 cues for breath control and setup Bridge 2x12 STS 2x5 BW cues for increased ascent velocity for glute engagement, controlled descent STS 5# 2x5 cues for pacing  Standing triple flex<>ext x12 BIL LE  Standing hip openers/closers 3x5 BIL cues for form, posture, and sequencing  Verbal HEP review/discussion, relevant anatomy/physiology, rationale for interventions   PATIENT EDUCATION:  Education details: Updated HEP  Person educated: Patient Education method: Explanation, Demonstration, Tactile cues, Verbal cues Education comprehension: verbalized understanding, returned demonstration, verbal cues required, tactile cues required, and needs further education     HOME EXERCISE PROGRAM: Access Code: [email or web address] URL: [email or web address] Date: 06/01/2023 Prepared by: Carlynn Herald  Program Notes - with marches, please do flexion into extension (moving leg backwards) as done in clinic  Exercises - Small Range Straight Leg Raise  - 2-3 x daily - 1 sets - 8 reps - Standing March with Counter Support  - 2-3 x daily - 1 sets - 5 reps - Supine Lower Trunk Rotation  - 1 x daily - 7 x weekly - 3 sets - 10 reps - Figure 4  Bridge  - 1 x daily - 7 x weekly - 3 sets - 10 reps - Seated Hip Flexor Stretch  - 2 x daily - 7 x weekly - 1 sets - 3 reps - 30 sec hold - Supine Piriformis Stretch with Leg Straight  - 2 x daily - 7 x weekly - 1 sets - 3 reps - 30 sec hold - Prone Hip Extension on Table  - 1 x daily - 7 x weekly - 3 sets - 10 reps - Standing Hip Abduction with Resistance at Ankles and Counter Support  - 1 x daily - 7 x weekly - 3 sets - 10 reps  ASSESSMENT:  CLINICAL IMPRESSION: Side lying exercises deferred increased pain in hips in position. Standing glute strengthening exercises progressed as tolerated; occasional tactile cues provided to improve postural alignment and mechanics. Glute fatigue greater on R as compared to L during standing resisted hip abduction.  OBJECTIVE IMPAIRMENTS: decreased activity tolerance, decreased endurance, decreased mobility, decreased ROM, decreased strength, improper body mechanics, and pain.   ACTIVITY LIMITATIONS: sitting, standing, sleeping, and transfers  PARTICIPATION LIMITATIONS: meal prep, cleaning, laundry, driving, and community activity  PERSONAL FACTORS: Age, Time since onset of injury/illness/exacerbation, and 1 comorbidity: depression  are also affecting patient's functional outcome.   REHAB POTENTIAL: Good  CLINICAL DECISION MAKING: Stable/uncomplicated  EVALUATION COMPLEXITY: Low   GOALS:  SHORT TERM GOALS: Target date: 06/08/2023  Pt will demonstrate appropriate understanding and performance of initially prescribed HEP in order to facilitate improved independence with management of symptoms.  Baseline: HEP provided on eval Goal status: INITIAL   2. Pt will report at least 25% decrease in overall pain levels in past week in order to facilitate improved tolerance to basic ADLs/mobility.   Baseline: 0-8/10  Goal status: INITIAL    LONG TERM GOALS: Target date: 06/29/2023 Pt will score 73 or greater on FOTO in order to demonstrate improved  perception of function due to symptoms.  Baseline: 71 Goal status: INITIAL  2.  Pt will demonstrate at least 4+/5 global hip MMT bilaterally in order to facilitate improved functional strength. Baseline: see MMT chart above Goal status: INITIAL  3.  Pt will report at least 50% decrease in overall pain levels in past week in order to facilitate improved tolerance to basic ADLs/mobility.   Baseline: 0-8/10  Goal status: INITIAL    4.  Pt will be able to perform 5xSTS in less than or equal to 9 sec w/o UE support in order to demonstrate reduced fall risk and improved functional independence (MCID 5xSTS = 2.3 sec). Baseline: 11sec w gentle UE support Goal status: INITIAL   5. Pt will demonstrate appropriate performance of final prescribed HEP in order to facilitate improved self-management of symptoms post-discharge.   Baseline: initial HEP prescribed  Goal status: INITIAL     PLAN:  PT FREQUENCY: 1-2x/week (plan for 2x/week initially with taper to 1x/week as appropriate)  PT DURATION: 6 weeks  PLANNED INTERVENTIONS: 97164- PT Re-evaluation, 97110-Therapeutic exercises, 97530- Therapeutic activity, 97112- Neuromuscular re-education, 97535- Self Care, [id number]- Manual therapy, 97116- Gait training, 97014- Electrical stimulation (unattended), Patient/Family education, Balance training, Stair training, Taping, Dry Needling, Joint mobilization, Spinal mobilization, Cryotherapy, and Moist heat  PLAN FOR NEXT SESSION: Work on ROM/strength exercises as appropriate with emphasis on rotational hip strength/endurance, hip flexor strength and mobility. Improving symmetry with functional movements. Symptom modification strategies as indicated/appropriate.   Carlynn Herald, PTA 06/01/2023 11:46 AM

## 2023-06-03 ENCOUNTER — Ambulatory Visit: Payer: Medicare PPO

## 2023-06-03 DIAGNOSIS — M7061 Trochanteric bursitis, right hip: Secondary | ICD-10-CM | POA: Diagnosis not present

## 2023-06-03 DIAGNOSIS — M6281 Muscle weakness (generalized): Secondary | ICD-10-CM

## 2023-06-03 DIAGNOSIS — M25552 Pain in left hip: Secondary | ICD-10-CM | POA: Diagnosis not present

## 2023-06-03 DIAGNOSIS — M25551 Pain in right hip: Secondary | ICD-10-CM | POA: Diagnosis not present

## 2023-06-03 DIAGNOSIS — M7062 Trochanteric bursitis, left hip: Secondary | ICD-10-CM | POA: Diagnosis not present

## 2023-06-03 NOTE — Therapy (Signed)
OUTPATIENT PHYSICAL THERAPY TREATMENT   Patient Name: Amanda Schmitt MRN: [id number] DOB:[birth year], 78 y.o., female Today's Date: 06/03/2023  END OF SESSION:  PT End of Session - 06/03/23 1022     Visit Number 5    Number of Visits 10    Date for PT Re-Evaluation 06/29/23    Authorization Type humana medicare    Authorization Time Period 12 visits approved for PT 05/18/2023-06/29/2023    Authorization - Visit Number 5    Authorization - Number of Visits 12    PT Start Time 1022    PT Stop Time 1105    PT Time Calculation (min) 43 min    Activity Tolerance Patient tolerated treatment well    Behavior During Therapy WFL for tasks assessed/performed            Past Medical History:  Diagnosis Date   Depression    Past Surgical History:  Procedure Laterality Date   ABDOMINAL HYSTERECTOMY     ABDOMINAL HYSTERECTOMY     Bowel blockage     BOWEL RESECTION     CESAREAN SECTION     Patient Active Problem List   Diagnosis Date Noted   Mass of hip region, right 02/16/2023   Thyroid function study abnormality 02/10/2023   Primary osteoarthritis of both hips 01/03/2023   Polyarthralgia with elevated CCP 01/03/2023   Visit for suture removal 09/27/2022   Right cervical radiculopathy 07/02/2022   Headache 06/14/2022   Neck swelling 03/01/2022   Elevated blood-pressure reading without diagnosis of hypertension 01/11/2022   B12 deficiency 01/11/2022   Well adult exam 07/08/2021   Recurrent UTI 09/23/2020   Lumbar spondylosis 06/30/2020   Trochanteric bursitis of both hips 04/15/2020   GAD (generalized anxiety disorder) 10/16/2019   Trigeminal neuralgia 09/27/2019   Palpitations 09/27/2019   Epigastric pain 09/27/2019   Primary insomnia 06/22/2016   Osteopenia of multiple sites 08/11/2015   Mixed hyperlipidemia 08/11/2015   Major depressive disorder in full remission (HCC) 08/11/2015   Senile nuclear sclerosis 07/25/2014    PCP: Everrett Coombe, DO  REFERRING  PROVIDER: Monica Becton, MD  REFERRING DIAG: M70.61,M70.62 (ICD-10-CM) - Trochanteric bursitis of both hips  THERAPY DIAG:  Pain of both hip joints  Muscle weakness (generalized)  Rationale for Evaluation and Treatment: Rehabilitation  ONSET DATE: about a month ago  SUBJECTIVE:  SUBJECTIVE STATEMENT: Patient reports she is feeling good today, no pain. Patient states she continues to have and initial "pulling" in L hip when first standing but overall everything is feeling better.   PERTINENT HISTORY: Depression, R hip mass (rectus femoris)  PAIN:  Are you having pain: 0/10  Per eval -  Location/description: anterior hips BIL R>L Best-worst over past week: 0-8/10  - aggravating factors: driving, prolonged sitting, transfers - Easing factors: movement, medication, heating pad     PRECAUTIONS: None  WEIGHT BEARING RESTRICTIONS: No  FALLS:  Has patient fallen in last 6 months? Yes. Number of falls 1 fall in May 2024, fell down a flight of stairs at [employer], slipped  LIVING ENVIRONMENT: 1 level home, no STE Lives alone, does housework, has help with yardwork   OCCUPATION: retired - former Runner, broadcasting/film/video  PLOF: Independent - enjoys reading, movies, puzzles   PATIENT GOALS: improve sleep  NEXT MD VISIT: 06/28/23  OBJECTIVE:  Note: Objective measures were completed at Evaluation unless otherwise noted.  DIAGNOSTIC FINDINGS:  R hip MRI 03/31/23 "IMPRESSION: 1. A 7.1 x 3.6 x 6.6 cm lipomatous mass within the  right rectus femoris muscle without enhancing septations or enhancing soft tissue nodule most consistent with a simple lipoma. 2. Moderate partial-thickness cartilage loss of the posterosuperior right femoral head and acetabulum with subchondral marrow edema in the femoral head."  BIL Hip XR 05/10/23; no read yet in EPIC at time of eval  PATIENT SURVEYS:  FOTO 71 > 73  COGNITION: Overall cognitive status: Within functional limits for tasks  assessed     SENSATION: LT intact BIL LE   LOWER EXTREMITY ROM:     ROM Right eval Left eval  Hip flexion    Hip extension    Hip internal rotation    Hip external rotation    Knee extension    Knee flexion    (Blank rows = not tested) (Key: WFL = within functional limits not formally assessed, * = concordant pain, s = stiffness/stretching sensation, NT = not tested)  Comments: mild R hip stiffness into IR compared to L. ER WNL and painless. FABER negative BIL, FADDIR mildly positive R compared to L. Lumbar rotation AROM symmetrical and painless  LOWER EXTREMITY MMT:    MMT Right eval Left eval  Hip flexion 4- * 4  Hip abduction (modified sitting) 4 4  Hip internal rotation 4 - * 4  Hip external rotation 4 4  Knee flexion 5 5  Knee extension 5 5  Ankle dorsiflexion     (Blank rows = not tested) (Key: WFL = within functional limits not formally assessed, * = concordant pain, s = stiffness/stretching sensation, NT = not tested)  Comments:    LOWER EXTREMITY SPECIAL TESTS:  FABER/FADDIR as above  FUNCTIONAL TESTS:  5xSTS: 11.35 sec gentle UE support from thighs. Tendency for inc weight shift to R on descent  GAIT: Distance walked: within clinic Assistive device utilized: None Level of assistance: Complete Independence Comments: reduced truncal rotation and arm swing, reduced hip extension                                                                                                                               TREATMENT DATE:  [facility] Adult PT Treatment:                                                DATE: 06/03/2023 Therapeutic Exercise: Quadruped: Knee extension (alt) Hip hike with opp knee on yoga block (R weak) Side Lying with feet against tall box: R abd slide up/down R slide on back diagonal Top leg hip hike lifts Thomas stretch from back edge of table (for home on bed) Figure 4 bridges Staggered stance squat Staggered stance STS + RTB above  knees   [facility] Adult PT Treatment:  DATE: 06/01/2023 Therapeutic Exercise: Maisie Fus stretch 2x30" (B) Small range SLR x15 (B) Quadruped hip extension Standing glute med press with ball into wall Seated figure 4 stretch Figure 4 bridge --> unable to tolerate with R leg bent d/t glute pain Self-massage with 4"ball for myofascial release to R glute Bridges --> staggered stance bridge Bent over hip extension Bent over hip extension on diagonal Resisted side stepping + GTB crossed at ankles  Standing resisted hip abd + RTB crossed at ankles 2x10 (B)   [facility] Adult PT Treatment:                                          DATE: 05/25/2023 Therapeutic Exercise: Figure 4 LTR x10 (B) Thomas stretch 2x30" (B) Bridges --> figure 4 bridge Side Lying: Straight leg hip abd x10 Hip abd in IR x10 Small leg circles in abd x6 CW/CCW Piriformis stretch Prone: Quad stretch  Bent knee hip ext (HS cramping) Straight leg hip ext  Standing: HS curls from toe tap position Bent over hip ext from toe tap position   [facility] Adult PT Treatment:                                                DATE: 05/23/23 Therapeutic Exercise: Hip abduction 2x8 BIL cues for foot positioning SLR 2x8 BIL cues for foot positioning Hooklying swiss ball press down 2x8 cues for breath control and setup Bridge 2x12 STS 2x5 BW cues for increased ascent velocity for glute engagement, controlled descent STS 5# 2x5 cues for pacing  Standing triple flex<>ext x12 BIL LE  Standing hip openers/closers 3x5 BIL cues for form, posture, and sequencing  Verbal HEP review/discussion, relevant anatomy/physiology, rationale for interventions   PATIENT EDUCATION:  Education details: Updated HEP  Person educated: Patient Education method: Explanation, Demonstration, Tactile cues, Verbal cues Education comprehension: verbalized understanding, returned demonstration, verbal cues required, tactile cues  required, and needs further education     HOME EXERCISE PROGRAM: Access Code: [email or web address] URL: [email or web address] Date: 06/03/2023 Prepared by: Carlynn Herald  Program Notes - with marches, please do flexion into extension (moving leg backwards) as done in clinic  Exercises - Small Range Straight Leg Raise  - 2-3 x daily - 1 sets - 8 reps - Standing March with Counter Support  - 2-3 x daily - 1 sets - 5 reps - Supine Lower Trunk Rotation  - 1 x daily - 7 x weekly - 3 sets - 10 reps - Figure 4 Bridge  - 1 x daily - 7 x weekly - 3 sets - 10 reps - Seated Hip Flexor Stretch  - 2 x daily - 7 x weekly - 1 sets - 3 reps - 30 sec hold - Supine Piriformis Stretch with Leg Straight  - 2 x daily - 7 x weekly - 1 sets - 3 reps - 30 sec hold - Prone Hip Extension on Table  - 1 x daily - 7 x weekly - 3 sets - 10 reps - Standing Hip Abduction with Resistance at Ankles and Counter Support  - 1 x daily - 7 x weekly - 3 sets - 10 reps - Quadruped Hip Hike on Foam  - 1 x daily - 7 x weekly - 3 sets -  10 reps - Staggered Sit-to-Stand  - 1 x daily - 7 x weekly - 3 sets - 10 reps - Supine Posterior Pelvic Tilt  - 1 x daily - 7 x weekly - 3 sets - 10 reps - Standing with Back Flat Against Wall  - 1 x daily - 7 x weekly - 3 sets - 10 reps  ASSESSMENT:  CLINICAL IMPRESSION: Significant weakness demonstrated in R hip during quadruped rotational hip lift exercise. Patient challenged with hip hike exercise on R in side lying; tactile cues improved neutral pelvic alignment and core activation. No glute pain/spasm reported during figure 4 glute bridge. HEP updated to progress hip strengthening and pelvic alignment awareness.  OBJECTIVE IMPAIRMENTS: decreased activity tolerance, decreased endurance, decreased mobility, decreased ROM, decreased strength, improper body mechanics, and pain.   ACTIVITY LIMITATIONS: sitting, standing, sleeping, and transfers  PARTICIPATION LIMITATIONS: meal prep,  cleaning, laundry, driving, and community activity  PERSONAL FACTORS: Age, Time since onset of injury/illness/exacerbation, and 1 comorbidity: depression  are also affecting patient's functional outcome.   REHAB POTENTIAL: Good  CLINICAL DECISION MAKING: Stable/uncomplicated  EVALUATION COMPLEXITY: Low   GOALS:  SHORT TERM GOALS: Target date: 06/08/2023  Pt will demonstrate appropriate understanding and performance of initially prescribed HEP in order to facilitate improved independence with management of symptoms.  Baseline: HEP provided on eval Goal status: MET   2. Pt will report at least 25% decrease in overall pain levels in past week in order to facilitate improved tolerance to basic ADLs/mobility.   Baseline: 0-8/10 --> 06/03/23: 2/10  Goal status: MET  LONG TERM GOALS: Target date: 06/29/2023 Pt will score 73 or greater on FOTO in order to demonstrate improved perception of function due to symptoms.  Baseline: 71 Goal status: INITIAL  2.  Pt will demonstrate at least 4+/5 global hip MMT bilaterally in order to facilitate improved functional strength. Baseline: see MMT chart above Goal status: INITIAL  3.  Pt will report at least 50% decrease in overall pain levels in past week in order to facilitate improved tolerance to basic ADLs/mobility.   Baseline: 0-8/10  Goal status: INITIAL    4.  Pt will be able to perform 5xSTS in less than or equal to 9 sec w/o UE support in order to demonstrate reduced fall risk and improved functional independence (MCID 5xSTS = 2.3 sec). Baseline: 11sec w gentle UE support Goal status: INITIAL   5. Pt will demonstrate appropriate performance of final prescribed HEP in order to facilitate improved self-management of symptoms post-discharge.   Baseline: initial HEP prescribed  Goal status: INITIAL     PLAN:  PT FREQUENCY: 1-2x/week (plan for 2x/week initially with taper to 1x/week as appropriate)  PT DURATION: 6 weeks  PLANNED  INTERVENTIONS: 97164- PT Re-evaluation, 97110-Therapeutic exercises, 97530- Therapeutic activity, 97112- Neuromuscular re-education, 97535- Self Care, [id number]- Manual therapy, 97116- Gait training, 97014- Electrical stimulation (unattended), Patient/Family education, Balance training, Stair training, Taping, Dry Needling, Joint mobilization, Spinal mobilization, Cryotherapy, and Moist heat  PLAN FOR NEXT SESSION: Review PPT (HEP), continue hip hike/L glute strengthening next visit. Work on [employer] exercises as appropriate with emphasis on rotational hip strength/endurance, hip flexor strength and mobility. Improving symmetry with functional movements. Symptom modification strategies as indicated/appropriate.   Carlynn Herald, PTA 06/03/2023 11:26 AM

## 2023-06-06 ENCOUNTER — Encounter: Payer: Self-pay | Admitting: Physical Therapy

## 2023-06-06 ENCOUNTER — Ambulatory Visit: Payer: Medicare PPO | Admitting: Physical Therapy

## 2023-06-06 DIAGNOSIS — M25551 Pain in right hip: Secondary | ICD-10-CM | POA: Diagnosis not present

## 2023-06-06 DIAGNOSIS — M6281 Muscle weakness (generalized): Secondary | ICD-10-CM | POA: Diagnosis not present

## 2023-06-06 DIAGNOSIS — M7061 Trochanteric bursitis, right hip: Secondary | ICD-10-CM | POA: Diagnosis not present

## 2023-06-06 DIAGNOSIS — M7062 Trochanteric bursitis, left hip: Secondary | ICD-10-CM | POA: Diagnosis not present

## 2023-06-06 DIAGNOSIS — M25552 Pain in left hip: Secondary | ICD-10-CM | POA: Diagnosis not present

## 2023-06-06 NOTE — Therapy (Signed)
OUTPATIENT PHYSICAL THERAPY TREATMENT   Patient Name: Amanda Schmitt MRN: [id number] DOB:1945-09-03, 78 y.o., female Today's Date: 06/06/2023  END OF SESSION:  PT End of Session - 06/06/23 1446     Visit Number 6    Number of Visits 10    Date for PT Re-Evaluation 06/29/23    Authorization Type human[employer] medicare    Authorization Time Period 12 visits approved for PT 05/18/2023-06/29/2023    Authorization - Visit Number 6    Authorization - Number of Visits 12    PT Start Time 1446    PT Stop Time 1526    PT Time Calculation (min) 40 min    Activity Tolerance Patient tolerated treatment well             Past Medical History:  Diagnosis Date   Depression    Past Surgical History:  Procedure Laterality Date   ABDOMINAL HYSTERECTOMY     ABDOMINAL HYSTERECTOMY     Bowel blockage     BOWEL RESECTION     CESAREAN SECTION     Patient Active Problem List   Diagnosis Date Noted   Mass of hip region, right 02/16/2023   Thyroid function study abnormality 02/10/2023   Primary osteoarthritis of both hips 01/03/2023   Polyarthralgia with elevated CCP 01/03/2023   Visit for suture removal 09/27/2022   Right cervical radiculopathy 07/02/2022   Headache 06/14/2022   Neck swelling 03/01/2022   Elevated blood-pressure reading without diagnosis of hypertension 01/11/2022   B12 deficiency 01/11/2022   Well adult exam 07/08/2021   Recurrent UTI 09/23/2020   Lumbar spondylosis 06/30/2020   Trochanteric bursitis of both hips 04/15/2020   GAD (generalized anxiety disorder) 10/16/2019   Trigeminal neuralgia 09/27/2019   Palpitations 09/27/2019   Epigastric pain 09/27/2019   Primary insomnia 06/22/2016   Osteopenia of multiple sites 08/11/2015   Mixed hyperlipidemia 08/11/2015   Major depressive disorder in full remission (HCC) 08/11/2015   Senile nuclear sclerosis 07/25/2014    PCP: Everrett Coombe, DO  REFERRING PROVIDER: Monica Becton, MD  REFERRING DIAG:  M70.61,M70.62 (ICD-10-CM) - Trochanteric bursitis of both hips  THERAPY DIAG:  Pain of both hip joints  Muscle weakness (generalized)  Rationale for Evaluation and Treatment: Rehabilitation  ONSET DATE: about a month ago  SUBJECTIVE:  SUBJECTIVE STATEMENT: Still having some "grabbing" in anterior hips when getting out of car. States she does feel like things are improving in general. Not really having sleep problems anymore but will have some stiffness/pain upon waking.     PERTINENT HISTORY: Depression, R hip mass (rectus femoris)  PAIN:  Are you having pain: 0/10  Per eval -  Location/description: anterior hips BIL R>L Best-worst over past week: 0-8/10  - aggravating factors: driving, prolonged sitting, transfers - Easing factors: movement, medication, heating pad     PRECAUTIONS: None  WEIGHT BEARING RESTRICTIONS: No  FALLS:  Has patient fallen in last 6 months? Yes. Number of falls 1 fall in May 2024, fell down a flight of stairs at [employer], slipped  LIVING ENVIRONMENT: 1 level home, no STE Lives alone, does housework, has help with yardwork   OCCUPATION: retired - former Runner, broadcasting/film/video  PLOF: Independent - enjoys reading, movies, puzzles   PATIENT GOALS: improve sleep  NEXT MD VISIT: 06/28/23  OBJECTIVE:  Note: Objective measures were completed at Evaluation unless otherwise noted.  DIAGNOSTIC FINDINGS:  R hip MRI 03/31/23 "IMPRESSION: 1. A 7.1 x 3.6 x 6.6 cm lipomatous mass within the right rectus  femoris muscle without enhancing septations or enhancing soft tissue nodule most consistent with a simple lipoma. 2. Moderate partial-thickness cartilage loss of the posterosuperior right femoral head and acetabulum with subchondral marrow edema in the femoral head."  BIL Hip XR 05/10/23; no read yet in EPIC at time of eval  PATIENT SURVEYS:  FOTO 71 > 73 FOTO 06/06/23: 72  COGNITION: Overall cognitive status: Within functional limits for tasks  assessed     SENSATION: LT intact BIL LE   LOWER EXTREMITY ROM:     ROM Right eval Left eval  Hip flexion    Hip extension    Hip internal rotation    Hip external rotation    Knee extension    Knee flexion    (Blank rows = not tested) (Key: WFL = within functional limits not formally assessed, * = concordant pain, s = stiffness/stretching sensation, NT = not tested)  Comments: mild R hip stiffness into IR compared to L. ER WNL and painless. FABER negative BIL, FADDIR mildly positive R compared to L. Lumbar rotation AROM symmetrical and painless  LOWER EXTREMITY MMT:    MMT Right eval Left eval  Hip flexion 4- * 4  Hip abduction (modified sitting) 4 4  Hip internal rotation 4 - * 4  Hip external rotation 4 4  Knee flexion 5 5  Knee extension 5 5  Ankle dorsiflexion     (Blank rows = not tested) (Key: WFL = within functional limits not formally assessed, * = concordant pain, s = stiffness/stretching sensation, NT = not tested)  Comments:    LOWER EXTREMITY SPECIAL TESTS:  FABER/FADDIR as above  FUNCTIONAL TESTS:  5xSTS: 11.35 sec gentle UE support from thighs. Tendency for inc weight shift to R on descent  GAIT: Distance walked: within clinic Assistive device utilized: None Level of assistance: Complete Independence Comments: reduced truncal rotation and arm swing, reduced hip extension                                                                                                                               TREATMENT DATE:  [facility] Adult PT Treatment:                                                DATE: 06/06/23 Therapeutic Exercise: Sidelying hip hike (tall box + ball) x12 BIL Quadruped hip openers/closers x8 BIL cues for full ROM  Kneeling>tall kneeling x10 cues for posterior pelvic tilt, posture  Figure four bridge x8 BIL cues for foot positioning Standing hip hike w/ ball at wall 2x10 BIL Standing hip hike at step, practice reps HEP handout +  education/discussion, educating on different strategies to maximize adherence and set up at home    [facility] Adult PT Treatment:  DATE: 06/03/2023 Therapeutic Exercise: Quadruped: Knee extension (alt) Hip hike with opp knee on yoga block (R weak) Side Lying with feet against tall box: R abd slide up/down R slide on back diagonal Top leg hip hike lifts Thomas stretch from back edge of table (for home on bed) Figure 4 bridges Staggered stance squat Staggered stance STS + RTB above knees   [facility] Adult PT Treatment:                                                DATE: 06/01/2023 Therapeutic Exercise: Maisie Fus stretch 2x30" (B) Small range SLR x15 (B) Quadruped hip extension Standing glute med press with ball into wall Seated figure 4 stretch Figure 4 bridge --> unable to tolerate with R leg bent d/t glute pain Self-massage with 4"ball for myofascial release to R glute Bridges --> staggered stance bridge Bent over hip extension Bent over hip extension on diagonal Resisted side stepping + GTB crossed at ankles  Standing resisted hip abd + RTB crossed at ankles 2x10 (B)   [facility] Adult PT Treatment:                                          DATE: 05/25/2023 Therapeutic Exercise: Figure 4 LTR x10 (B) Thomas stretch 2x30" (B) Bridges --> figure 4 bridge Side Lying: Straight leg hip abd x10 Hip abd in IR x10 Small leg circles in abd x6 CW/CCW Piriformis stretch Prone: Quad stretch  Bent knee hip ext (HS cramping) Straight leg hip ext  Standing: HS curls from toe tap position Bent over hip ext from toe tap position   [facility] Adult PT Treatment:                                                DATE: 05/23/23 Therapeutic Exercise: Hip abduction 2x8 BIL cues for foot positioning SLR 2x8 BIL cues for foot positioning Hooklying swiss ball press down 2x8 cues for breath control and setup Bridge 2x12 STS 2x5 BW cues for increased ascent velocity for  glute engagement, controlled descent STS 5# 2x5 cues for pacing  Standing triple flex<>ext x12 BIL LE  Standing hip openers/closers 3x5 BIL cues for form, posture, and sequencing  Verbal HEP review/discussion, relevant anatomy/physiology, rationale for interventions   PATIENT EDUCATION:  Education details: rationale for interventions Person educated: Patient Education method: Explanation, Demonstration, Tactile cues, Verbal cues Education comprehension: verbalized understanding, returned demonstration, verbal cues required, tactile cues required, and needs further education     HOME EXERCISE PROGRAM: Access Code: [email or web address] URL: [email or web address] Date: 06/06/2023 Prepared by: Fransisco Hertz  Exercises - Figure 4 Bridge  - 1 x daily - 7 x weekly - 3 sets - 10 reps - Tall Kneel Vertical Bridge  - 2-3 x daily - 1 sets - 8 reps - Quadruped Academic librarian  - 2-3 x daily - 1 sets - 8 reps - Hip Hiking on Step  - 2-3 x daily - 1 sets - 8 reps - Standing Hip Abduction with Resistance at Ankles and Counter Support  - 2-3 x daily - 1 sets - 8 reps  ASSESSMENT:  CLINICAL IMPRESSION: Pt arrives w/o pain, no issues after last session. Today continuing to work on hip/low back active mobility which pt does well with. Time spent condensing HEP w/ increased time for education/discussion on rationale and relevant anatomy/physiology in order to promote increased self efficacy and adherence going forward. No adverse events, pt tolerates well. Recommend continuing along current POC in order to address relevant deficits and improve functional tolerance. Pt departs today's session in no acute distress, all voiced questions/concerns addressed appropriately from PT perspective.     OBJECTIVE IMPAIRMENTS: decreased activity tolerance, decreased endurance, decreased mobility, decreased ROM, decreased strength, improper body mechanics, and pain.   ACTIVITY LIMITATIONS: sitting, standing, sleeping,  and transfers  PARTICIPATION LIMITATIONS: meal prep, cleaning, laundry, driving, and community activity  PERSONAL FACTORS: Age, Time since onset of injury/illness/exacerbation, and 1 comorbidity: depression  are also affecting patient's functional outcome.   REHAB POTENTIAL: Good  CLINICAL DECISION MAKING: Stable/uncomplicated  EVALUATION COMPLEXITY: Low   GOALS:  SHORT TERM GOALS: Target date: 06/08/2023  Pt will demonstrate appropriate understanding and performance of initially prescribed HEP in order to facilitate improved independence with management of symptoms.  Baseline: HEP provided on eval Goal status: MET   2. Pt will report at least 25% decrease in overall pain levels in past week in order to facilitate improved tolerance to basic ADLs/mobility.   Baseline: 0-8/10 --> 06/03/23: 2/10  Goal status: MET  LONG TERM GOALS: Target date: 06/29/2023 Pt will score 73 or greater on FOTO in order to demonstrate improved perception of function due to symptoms.  Baseline: 71 Goal status: INITIAL  2.  Pt will demonstrate at least 4+/5 global hip MMT bilaterally in order to facilitate improved functional strength. Baseline: see MMT chart above Goal status: INITIAL  3.  Pt will report at least 50% decrease in overall pain levels in past week in order to facilitate improved tolerance to basic ADLs/mobility.   Baseline: 0-8/10  Goal status: INITIAL    4.  Pt will be able to perform 5xSTS in less than or equal to 9 sec w/o UE support in order to demonstrate reduced fall risk and improved functional independence (MCID 5xSTS = 2.3 sec). Baseline: 11sec w gentle UE support Goal status: INITIAL   5. Pt will demonstrate appropriate performance of final prescribed HEP in order to facilitate improved self-management of symptoms post-discharge.   Baseline: initial HEP prescribed  Goal status: INITIAL     PLAN:  PT FREQUENCY: 1-2x/week (plan for 2x/week initially with taper to 1x/week as  appropriate)  PT DURATION: 6 weeks  PLANNED INTERVENTIONS: 97164- PT Re-evaluation, 97110-Therapeutic exercises, 97530- Therapeutic activity, 97112- Neuromuscular re-education, 97535- Self Care, 16109- Manual therapy, 97116- Gait training, 97014- Electrical stimulation (unattended), Patient/Family education, Balance training, Stair training, Taping, Dry Needling, Joint mobilization, Spinal mobilization, Cryotherapy, and Moist heat  PLAN FOR NEXT SESSION: Work on ROM/strength exercises as appropriate with emphasis on rotational hip strength/endurance, hip flexor strength and mobility. Improving symmetry with functional movements. Symptom modification strategies as indicated/appropriate.   Ashley Murrain PT, DPT 06/06/2023 3:28 PM

## 2023-06-08 ENCOUNTER — Encounter: Payer: Medicare PPO | Admitting: Physical Therapy

## 2023-06-09 ENCOUNTER — Other Ambulatory Visit: Payer: Self-pay | Admitting: Medical-Surgical

## 2023-06-13 ENCOUNTER — Ambulatory Visit: Payer: Medicare PPO | Attending: Sports Medicine | Admitting: Physical Therapy

## 2023-06-13 ENCOUNTER — Other Ambulatory Visit: Payer: Self-pay | Admitting: Family Medicine

## 2023-06-13 ENCOUNTER — Encounter: Payer: Self-pay | Admitting: Physical Therapy

## 2023-06-13 DIAGNOSIS — M6281 Muscle weakness (generalized): Secondary | ICD-10-CM | POA: Insufficient documentation

## 2023-06-13 DIAGNOSIS — M25552 Pain in left hip: Secondary | ICD-10-CM | POA: Diagnosis not present

## 2023-06-13 DIAGNOSIS — M25551 Pain in right hip: Secondary | ICD-10-CM | POA: Diagnosis not present

## 2023-06-13 NOTE — Telephone Encounter (Signed)
Requesting rx rf of zolpidem 5mg  Last written 05/13/2023- no refills  Last OV 02/10/2023 Upcoming appt 06/16/23 for physical.

## 2023-06-13 NOTE — Therapy (Signed)
OUTPATIENT PHYSICAL THERAPY TREATMENT + DISCHARGE SUMMARY   Patient Name: Amanda Schmitt MRN: [id number] DOB:May 22, 1945, 78 y.o., female Today's Date: 06/13/2023   PHYSICAL THERAPY DISCHARGE SUMMARY  Visits from Start of Care: 7  Current functional level related to goals / functional outcomes: No overt limitations w daily activities/mobility   Remaining deficits: Pain, reduced hip strength   Education / Equipment: HEP, discharge education, follow up with provider   Patient agrees to discharge. Patient goals were partially met. Patient is being discharged due to being pleased with the current functional level.   END OF SESSION:  PT End of Session - 06/13/23 1406     Visit Number 7    Number of Visits 10    Date for PT Re-Evaluation 06/29/23    Authorization Type humana medicare    Authorization Time Period 12 visits approved for PT 05/18/2023-06/29/2023    Authorization - Visit Number 7    Authorization - Number of Visits 12    PT Start Time 1407   late check in   PT Stop Time 1445    PT Time Calculation (min) 38 min    Activity Tolerance Patient tolerated treatment well              Past Medical History:  Diagnosis Date   Depression    Past Surgical History:  Procedure Laterality Date   ABDOMINAL HYSTERECTOMY     ABDOMINAL HYSTERECTOMY     Bowel blockage     BOWEL RESECTION     CESAREAN SECTION     Patient Active Problem List   Diagnosis Date Noted   Mass of hip region, right 02/16/2023   Thyroid function study abnormality 02/10/2023   Primary osteoarthritis of both hips 01/03/2023   Polyarthralgia with elevated CCP 01/03/2023   Visit for suture removal 09/27/2022   Right cervical radiculopathy 07/02/2022   Headache 06/14/2022   Neck swelling 03/01/2022   Elevated blood-pressure reading without diagnosis of hypertension 01/11/2022   B12 deficiency 01/11/2022   Well adult exam 07/08/2021   Recurrent UTI 09/23/2020   Lumbar spondylosis 06/30/2020    Trochanteric bursitis of both hips 04/15/2020   GAD (generalized anxiety disorder) 10/16/2019   Trigeminal neuralgia 09/27/2019   Palpitations 09/27/2019   Epigastric pain 09/27/2019   Primary insomnia 06/22/2016   Osteopenia of multiple sites 08/11/2015   Mixed hyperlipidemia 08/11/2015   Major depressive disorder in full remission (HCC) 08/11/2015   Senile nuclear sclerosis 07/25/2014    PCP: Everrett Coombe, DO  REFERRING PROVIDER: Monica Becton, MD  REFERRING DIAG: M70.61,M70.62 (ICD-10-CM) - Trochanteric bursitis of both hips  THERAPY DIAG:  Pain of both hip joints  Muscle weakness (generalized)  Rationale for Evaluation and Treatment: Rehabilitation  ONSET DATE: about a month ago  SUBJECTIVE:  SUBJECTIVE STATEMENT: Pt states she continues to have some stiffness/pain with sleeping but still improved. Also doing well with stairs and transfers although still having grabbing in front of L hip. States she feels satisfied with progress thus far and feels ready to discharge to independent HEP    PERTINENT HISTORY: Depression, R hip mass (rectus femoris)  PAIN:  Are you having pain: 0/10 Worst: 4-5/10 (with sleeping)  Per eval -  Location/description: anterior hips BIL R>L Best-worst over past week: 0-8/10  - aggravating factors: driving, prolonged sitting, transfers - Easing factors: movement, medication, heating pad     PRECAUTIONS: None  WEIGHT BEARING RESTRICTIONS: No  FALLS:  Has patient fallen in last 6 months? Yes. Number  of falls 1 fall in May 2024, fell down a flight of stairs at [employer], slipped  LIVING ENVIRONMENT: 1 level home, no STE Lives alone, does housework, has help with yardwork   OCCUPATION: retired - former Runner, broadcasting/film/video  PLOF: Independent - enjoys reading, movies, puzzles   PATIENT GOALS: improve sleep  NEXT MD VISIT: 06/28/23  OBJECTIVE:  Note: Objective measures were completed at Evaluation unless otherwise  noted.  DIAGNOSTIC FINDINGS:  R hip MRI 03/31/23 "IMPRESSION: 1. A 7.1 x 3.6 x 6.6 cm lipomatous mass within the right rectus femoris muscle without enhancing septations or enhancing soft tissue nodule most consistent with a simple lipoma. 2. Moderate partial-thickness cartilage loss of the posterosuperior right femoral head and acetabulum with subchondral marrow edema in the femoral head."  BIL Hip XR 05/10/23; no read yet in EPIC at time of eval  PATIENT SURVEYS:  FOTO 71 > 73 FOTO 06/06/23: 72  COGNITION: Overall cognitive status: Within functional limits for tasks assessed     SENSATION: LT intact BIL LE   LOWER EXTREMITY ROM:     ROM Right eval Left eval  Hip flexion    Hip extension    Hip internal rotation    Hip external rotation    Knee extension    Knee flexion    (Blank rows = not tested) (Key: WFL = within functional limits not formally assessed, * = concordant pain, s = stiffness/stretching sensation, NT = not tested)  Comments: mild R hip stiffness into IR compared to L. ER WNL and painless. FABER negative BIL, FADDIR mildly positive R compared to L. Lumbar rotation AROM symmetrical and painless  LOWER EXTREMITY MMT:    MMT Right eval Left eval R/L 06/13/23  Hip flexion 4- * 4 4+ / 4+  Hip abduction (modified sitting) 4 4 5/5  Hip internal rotation 4 - * 4 4+ / 4  Hip external rotation 4 4 4+ / 4+  Knee flexion 5 5   Knee extension 5 5   Ankle dorsiflexion      (Blank rows = not tested) (Key: WFL = within functional limits not formally assessed, * = concordant pain, s = stiffness/stretching sensation, NT = not tested)  Comments:    LOWER EXTREMITY SPECIAL TESTS:  FABER/FADDIR as above  FUNCTIONAL TESTS:  5xSTS: 11.35 sec gentle UE support from thighs. Tendency for inc weight shift to R on descent  06/13/23: - 5xSTS: 7.72sec gentle UE support from thighs, no pain   GAIT: Distance walked: within clinic Assistive device utilized: None Level  of assistance: Complete Independence Comments: reduced truncal rotation and arm swing, reduced hip extension                                                                                                                               TREATMENT DATE:  [facility] Adult PT Treatment:  DATE: 06/13/23 Therapeutic Exercise: Blue band hip abduction x8 BIL  Hip hike off step x10 BIL Figure four bridge x10 BIL Fire hydrant, quadruped x10 Bridge x10  HEP handout + education  Therapeutic Activity: Education/discussion re: progress with PT, symptom behavior as it affects activity tolerance, PT goals/POC, discharge planning, gradual progression of activities MSK assessment + education 5xSTS + education   PATIENT EDUCATION:  Education details: PT POC, PT goals, progress with PT thus far, discharge planning, HEP, follow up with provider Person educated: Patient Education method: Explanation, Demonstration, Verbal cues Education comprehension: verbalized understanding, returned demonstration   HOME EXERCISE PROGRAM: Access Code: [email or web address] URL: [email or web address] Date: 06/06/2023 Prepared by: Fransisco Hertz  Exercises - Figure 4 Bridge  - 1 x daily - 7 x weekly - 3 sets - 10 reps - Tall Kneel Vertical Bridge  - 2-3 x daily - 1 sets - 8 reps - Quadruped Academic librarian  - 2-3 x daily - 1 sets - 8 reps - Hip Hiking on Step  - 2-3 x daily - 1 sets - 8 reps - Standing Hip Abduction with Resistance at Ankles and Counter Support  - 2-3 x daily - 1 sets - 8 reps  ASSESSMENT:  CLINICAL IMPRESSION: Pt arrives w/o pain, states she feels ready to discharge. Continues to have mild-moderate pain in L hip at times but no longer limiting daily tasks and functional mobility. R hip pain much improved per pt report. On exam she demonstrates improvements in hip strength and 5xSTS, does well with HEP in clinic. Full goal list addressed below. Pt denies any  questions/concerns at this time, verbalizes agreement with plan to discharge to independent HEP at this time. Pt departs today's session in no acute distress, all voiced questions/concerns addressed appropriately from PT perspective.      OBJECTIVE IMPAIRMENTS: decreased activity tolerance, decreased endurance, decreased mobility, decreased ROM, decreased strength, improper body mechanics, and pain.   ACTIVITY LIMITATIONS: sitting, standing, sleeping, and transfers  PARTICIPATION LIMITATIONS: meal prep, cleaning, laundry, driving, and community activity  PERSONAL FACTORS: Age, Time since onset of injury/illness/exacerbation, and 1 comorbidity: depression  are also affecting patient's functional outcome.   REHAB POTENTIAL: Good  CLINICAL DECISION MAKING: Stable/uncomplicated  EVALUATION COMPLEXITY: Low   GOALS:  SHORT TERM GOALS: Target date: 06/08/2023  Pt will demonstrate appropriate understanding and performance of initially prescribed HEP in order to facilitate improved independence with management of symptoms.  Baseline: HEP provided on eval Goal status: MET   2. Pt will report at least 25% decrease in overall pain levels in past week in order to facilitate improved tolerance to basic ADLs/mobility.   Baseline: 0-8/10 --> 06/03/23: 2/10  Goal status: MET  LONG TERM GOALS: Target date: 06/29/2023 Pt will score 73 or greater on FOTO in order to demonstrate improved perception of function due to symptoms.  Baseline: 71 06/06/23: 72 Goal status: NOT MET  2.  Pt will demonstrate at least 4+/5 global hip MMT bilaterally in order to facilitate improved functional strength. Baseline: see MMT chart above 06/13/23: see MMT chart above Goal status: NEARLY MET  3.  Pt will report at least 50% decrease in overall pain levels in past week in order to facilitate improved tolerance to basic ADLs/mobility.   Baseline: 0-8/10  06/13/23: 0 to 4-5/10  Goal status: NEARLY MET  4.  Pt will be  able to perform 5xSTS in less than or equal to 9 sec w/o UE support in order to demonstrate reduced fall risk and  improved functional independence (MCID 5xSTS = 2.3 sec). Baseline: 11sec w gentle UE support 06/13/23: 7sec Goal status: MET  5. Pt will demonstrate appropriate performance of final prescribed HEP in order to facilitate improved self-management of symptoms post-discharge.   Baseline: initial HEP prescribed  06/13/23: reports good adherence with HEP  Goal status: MET    PLAN: DISCHARGE 06/13/23  PT FREQUENCY: NA  PT DURATION: NA  PLANNED INTERVENTIONS: NA  PLAN FOR NEXT SESSION: discharge to independent HEP, follow up with provider  Ashley Murrain PT, DPT 06/13/2023 4:40 PM

## 2023-06-14 ENCOUNTER — Other Ambulatory Visit: Payer: Self-pay | Admitting: Family Medicine

## 2023-06-14 MED ORDER — ZOLPIDEM TARTRATE 5 MG PO TABS: 5.0000 mg | ORAL_TABLET | Freq: Every evening | ORAL | 5 refills | Status: DC | PRN

## 2023-06-16 ENCOUNTER — Ambulatory Visit (INDEPENDENT_AMBULATORY_CARE_PROVIDER_SITE_OTHER): Payer: Medicare PPO | Admitting: Family Medicine

## 2023-06-16 ENCOUNTER — Encounter: Payer: Self-pay | Admitting: Family Medicine

## 2023-06-16 VITALS — BP 148/76 | HR 68 | Ht 60.0 in | Wt 130.0 lb

## 2023-06-16 DIAGNOSIS — R946 Abnormal results of thyroid function studies: Secondary | ICD-10-CM

## 2023-06-16 DIAGNOSIS — Z0000 Encounter for general adult medical examination without abnormal findings: Secondary | ICD-10-CM | POA: Diagnosis not present

## 2023-06-16 DIAGNOSIS — Z1231 Encounter for screening mammogram for malignant neoplasm of breast: Secondary | ICD-10-CM | POA: Diagnosis not present

## 2023-06-16 DIAGNOSIS — E538 Deficiency of other specified B group vitamins: Secondary | ICD-10-CM | POA: Diagnosis not present

## 2023-06-16 DIAGNOSIS — M8589 Other specified disorders of bone density and structure, multiple sites: Secondary | ICD-10-CM

## 2023-06-16 NOTE — Progress Notes (Signed)
 Amanda Schmitt - 78 y.o. female MRN [id number]  Date of birth: 12-Jan-1946  Subjective Chief Complaint  Patient presents with   Annual Exam    HPI Amanda Schmitt is a 78 y.o. female here today for annual exam.   She reports that she is doing well.  Doing well with current medications.  Wants to try coming off of lexapro . Isn't sure that it is doing a whole lot for her.   She continues to stay pretty active.  Follows a pretty healthy diet.   She is a non-smoker.  Occasional EtOH use.   Review of Systems  Constitutional:  Negative for chills, fever, malaise/fatigue and weight loss.  HENT:  Negative for congestion, ear pain and sore throat.   Eyes:  Negative for blurred vision, double vision and pain.  Respiratory:  Negative for cough and shortness of breath.   Cardiovascular:  Negative for chest pain and palpitations.  Gastrointestinal:  Negative for abdominal pain, blood in stool, constipation, heartburn and nausea.  Genitourinary:  Negative for dysuria and urgency.  Musculoskeletal:  Negative for joint pain and myalgias.  Neurological:  Negative for dizziness and headaches.  Endo/Heme/Allergies:  Does not bruise/bleed easily.  Psychiatric/Behavioral:  Negative for depression. The patient is not nervous/anxious and does not have insomnia.     Allergies  Allergen Reactions   Moxifloxacin Other (See Comments)    Flu like symptoms   Nitrofuran Derivatives    Codeine Nausea Only   Meloxicam  Rash   Tramadol  Nausea Only    Past Medical History:  Diagnosis Date   Depression     Past Surgical History:  Procedure Laterality Date   ABDOMINAL HYSTERECTOMY     ABDOMINAL HYSTERECTOMY     Bowel blockage     BOWEL RESECTION     CESAREAN SECTION      Social History   Socioeconomic History   Marital status: Widowed    Spouse name: Not on file   Number of children: 2   Years of education: 16   Highest education level: Bachelor's degree (e.g., BA, AB, BS)  Occupational  History   Occupation: Retired    Comment: Runner, Broadcasting/film/video  Tobacco Use   Smoking status: Former    Current packs/day: 0.00    Average packs/day: 0.5 packs/day for 26.0 years (13.0 ttl pk-yrs)    Types: Cigarettes    Start date: 47    Quit date: 1999    Years since quitting: 26.1   Smokeless tobacco: Never  Substance and Sexual Activity   Alcohol use: Yes    Alcohol/week: 2.0 standard drinks of alcohol    Types: 2 Glasses of wine per week    Comment: per night   Drug use: No   Sexual activity: Not Currently  Other Topics Concern   Not on file  Social History Narrative   Lives alone. Volunteers at [employer]. Enjoys doing crossword puzzles, reading and painting furniture.   Social Drivers of Corporate Investment Banker Strain: Low Risk  (05/10/2023)   Overall Financial Resource Strain (CARDIA)    Difficulty of Paying Living Expenses: Not hard at all  Food Insecurity: No Food Insecurity (05/10/2023)   Hunger Vital Sign    Worried About Running Out of Food in the Last Year: Never true    Ran Out of Food in the Last Year: Never true  Transportation Needs: No Transportation Needs (05/10/2023)   PRAPARE - Administrator, Civil Service (Medical): No  Lack of Transportation (Non-Medical): No  Physical Activity: Insufficiently Active (05/10/2023)   Exercise Vital Sign    Days of Exercise per Week: 2 days    Minutes of Exercise per Session: 30 min  Stress: No Stress Concern Present (05/10/2023)   [employer] of Occupational Health - Occupational Stress Questionnaire    Feeling of Stress : Not at all  Social Connections: Socially Isolated (05/10/2023)   Social Connection and Isolation Panel [NHANES]    Frequency of Communication with Friends and Family: More than three times a week    Frequency of Social Gatherings with Friends and Family: Twice a week    Attends Religious Services: Never    Database Administrator or Organizations: No    Attends Museum/gallery Exhibitions Officer: Not on file    Marital Status: Widowed    Family History  Problem Relation Age of Onset   Cancer Father        unorgin    Health Maintenance  Topic Date Due   Medicare Annual Wellness (AWV)  11/19/2022   INFLUENZA VACCINE  08/08/2023 (Originally 12/09/2022)   COVID-19 Vaccine (6 - 2024-25 season) 12/30/2023 (Originally 01/09/2023)   Hepatitis C Screening  02/10/2024 (Originally 06/24/1963)   MAMMOGRAM  08/23/2023   DEXA SCAN  12/04/2023   DTaP/Tdap/Td (4 - Td or Tdap) 08/24/2026   Pneumonia Vaccine 78+ Years old  Completed   Zoster Vaccines- Shingrix  Completed   HPV VACCINES  Aged Out   Colonoscopy  Discontinued     ----------------------------------------------------------------------------------------------------------------------------------------------------------------------------------------------------------------- Physical Exam BP (!) 148/76   Pulse 68   Ht 5' (1.524 m)   Wt 130 lb (59 kg)   SpO2 99%   BMI 25.39 kg/m   Physical Exam Constitutional:      General: She is not in acute distress. HENT:     Head: Normocephalic and atraumatic.     Right Ear: Tympanic membrane and ear canal normal.     Left Ear: Tympanic membrane and ear canal normal.     Nose: Nose normal.  Eyes:     General: No scleral icterus.    Conjunctiva/sclera: Conjunctivae normal.  Neck:     Thyroid : No thyromegaly.  Cardiovascular:     Rate and Rhythm: Normal rate and regular rhythm.     Heart sounds: Normal heart sounds.  Pulmonary:     Effort: Pulmonary effort is normal.     Breath sounds: Normal breath sounds.  Abdominal:     General: Bowel sounds are normal. There is no distension.     Palpations: Abdomen is soft.     Tenderness: There is no abdominal tenderness. There is no guarding.  Musculoskeletal:        General: Normal range of motion.     Cervical back: Normal range of motion and neck supple.  Lymphadenopathy:     Cervical: No cervical adenopathy.   Skin:    General: Skin is warm and dry.     Findings: No rash.  Neurological:     General: No focal deficit present.     Mental Status: She is alert and oriented to person, place, and time.     Cranial Nerves: No cranial nerve deficit.     Coordination: Coordination normal.  Psychiatric:        Mood and Affect: Mood normal.        Behavior: Behavior normal.     ------------------------------------------------------------------------------------------------------------------------------------------------------------------------------------------------------------------- Assessment and Plan  Well adult exam Well adult Orders Placed This Encounter  Procedures   MM 3D SCREENING MAMMOGRAM BILATERAL BREAST    Standing Status:   Future    Expiration Date:   06/15/2024    Reason for Exam (SYMPTOM  OR DIAGNOSIS REQUIRED):   breast cancer screening    Preferred imaging location?:   [facility]   DG Bone Density    Standing Status:   Future    Expiration Date:   06/15/2024    Reason for Exam (SYMPTOM  OR DIAGNOSIS REQUIRED):   osteopenia    Preferred imaging location?:   [facility]   CBC with Differential/Platelet   CMP14+EGFR   Lipid Panel With LDL/HDL Ratio   Vitamin D  (25 hydroxy)   TSH + free T4   B12  Screenings: Per lab orders Immunizations: Up-to-date Anticipatory guidance/risk factor reduction: Recommendations per AVS   No orders of the defined types were placed in this encounter.   No follow-ups on file.    This visit occurred during the SARS-CoV-2 public health emergency.  Safety protocols were in place, including screening questions prior to the visit, additional usage of staff PPE, and extensive cleaning of exam room while observing appropriate contact time as indicated for disinfecting solutions.

## 2023-06-16 NOTE — Assessment & Plan Note (Signed)
 Well adult Orders Placed This Encounter  Procedures   MM 3D SCREENING MAMMOGRAM BILATERAL BREAST    Standing Status:   Future    Expiration Date:   06/15/2024    Reason for Exam (SYMPTOM  OR DIAGNOSIS REQUIRED):   breast cancer screening    Preferred imaging location?:   [facility]   DG Bone Density    Standing Status:   Future    Expiration Date:   06/15/2024    Reason for Exam (SYMPTOM  OR DIAGNOSIS REQUIRED):   osteopenia    Preferred imaging location?:   [facility]   CBC with Differential/Platelet   CMP14+EGFR   Lipid Panel With LDL/HDL Ratio   Vitamin D  (25 hydroxy)   TSH + free T4   B12  Screenings: Per lab orders Immunizations: Up-to-date Anticipatory guidance/risk factor reduction: Recommendations per AVS

## 2023-06-16 NOTE — Patient Instructions (Signed)
 Preventive Care 83 Years and Older, Female Preventive care refers to lifestyle choices and visits with your health care provider that can promote health and wellness. Preventive care visits are also called wellness exams. What can I expect for my preventive care visit? Counseling Your health care provider may ask you questions about your: Medical history, including: Past medical problems. Family medical history. Pregnancy and menstrual history. History of falls. Current health, including: Memory and ability to understand (cognition). Emotional well-being. Home life and relationship well-being. Sexual activity and sexual health. Lifestyle, including: Alcohol, nicotine or tobacco, and drug use. Access to firearms. Diet, exercise, and sleep habits. Work and work Astronomer. Sunscreen use. Safety issues such as seatbelt and bike helmet use. Physical exam Your health care provider will check your: Height and weight. These may be used to calculate your BMI (body mass index). BMI is a measurement that tells if you are at a healthy weight. Waist circumference. This measures the distance around your waistline. This measurement also tells if you are at a healthy weight and may help predict your risk of certain diseases, such as type 2 diabetes and high blood pressure. Heart rate and blood pressure. Body temperature. Skin for abnormal spots. What immunizations do I need?  Vaccines are usually given at various ages, according to a schedule. Your health care provider will recommend vaccines for you based on your age, medical history, and lifestyle or other factors, such as travel or where you work. What tests do I need? Screening Your health care provider may recommend screening tests for certain conditions. This may include: Lipid and cholesterol levels. Hepatitis C test. Hepatitis B test. HIV (human immunodeficiency virus) test. STI (sexually transmitted infection) testing, if you are at  risk. Lung cancer screening. Colorectal cancer screening. Diabetes screening. This is done by checking your blood sugar (glucose) after you have not eaten for a while (fasting). Mammogram. Talk with your health care provider about how often you should have regular mammograms. BRCA-related cancer screening. This may be done if you have a family history of breast, ovarian, tubal, or peritoneal cancers. Bone density scan. This is done to screen for osteoporosis. Talk with your health care provider about your test results, treatment options, and if necessary, the need for more tests. Follow these instructions at home: Eating and drinking  Eat a diet that includes fresh fruits and vegetables, whole grains, lean protein, and low-fat dairy products. Limit your intake of foods with high amounts of sugar, saturated fats, and salt. Take vitamin and mineral supplements as recommended by your health care provider. Do not drink alcohol if your health care provider tells you not to drink. If you drink alcohol: Limit how much you have to 0-1 drink a day. Know how much alcohol is in your drink. In the [place] one drink equals one 12 oz bottle of beer (355 mL), one 5 oz glass of wine (148 mL), or one 1 oz glass of hard liquor (44 mL). Lifestyle Brush your teeth every morning and night with fluoride toothpaste. Floss one time each day. Exercise for at least 30 minutes 5 or more days each week. Do not use any products that contain nicotine or tobacco. These products include cigarettes, chewing tobacco, and vaping devices, such as e-cigarettes. If you need help quitting, ask your health care provider. Do not use drugs. If you are sexually active, practice safe sex. Use a condom or other form of protection in order to prevent STIs. Take aspirin only as told by  your health care provider. Make sure that you understand how much to take and what form to take. Work with your health care provider to find out whether it  is safe and beneficial for you to take aspirin daily. Ask your health care provider if you need to take a cholesterol-lowering medicine (statin). Find healthy ways to manage stress, such as: Meditation, yoga, or listening to music. Journaling. Talking to a trusted person. Spending time with friends and family. Minimize exposure to UV radiation to reduce your risk of skin cancer. Safety Always wear your seat belt while driving or riding in a vehicle. Do not drive: If you have been drinking alcohol. Do not ride with someone who has been drinking. When you are tired or distracted. While texting. If you have been using any mind-altering substances or drugs. Wear a helmet and other protective equipment during sports activities. If you have firearms in your house, make sure you follow all gun safety procedures. What's next? Visit your health care provider once a year for an annual wellness visit. Ask your health care provider how often you should have your eyes and teeth checked. Stay up to date on all vaccines. This information is not intended to replace advice given to you by your health care provider. Make sure you discuss any questions you have with your health care provider. Document Revised: 10/22/2020 Document Reviewed: 10/22/2020 Elsevier Patient Education  2024 [employer].

## 2023-06-17 LAB — CMP14+EGFR
ALT: 9 [IU]/L (ref 0–32)
AST: 26 [IU]/L (ref 0–40)
Albumin: 4.2 g/dL (ref 3.8–4.8)
Alkaline Phosphatase: 81 [IU]/L (ref 44–121)
BUN/Creatinine Ratio: 14 (ref 12–28)
BUN: 11 mg/dL (ref 8–27)
Bilirubin Total: 0.7 mg/dL (ref 0.0–1.2)
CO2: 22 mmol/L (ref 20–29)
Calcium: 9.5 mg/dL (ref 8.7–10.3)
Chloride: 101 mmol/L (ref 96–106)
Creatinine, Ser: 0.79 mg/dL (ref 0.57–1.00)
Globulin, Total: 2.2 g/dL (ref 1.5–4.5)
Glucose: 91 mg/dL (ref 70–99)
Potassium: 4.4 mmol/L (ref 3.5–5.2)
Sodium: 138 mmol/L (ref 134–144)
Total Protein: 6.4 g/dL (ref 6.0–8.5)
eGFR: 77 mL/min/{1.73_m2} (ref >59)

## 2023-06-17 LAB — CBC WITH DIFFERENTIAL/PLATELET
Basophils Absolute: 0 10*3/uL (ref 0.0–0.2)
Basos: 1 %
EOS (ABSOLUTE): 0.1 10*3/uL (ref 0.0–0.4)
Eos: 2 %
Hematocrit: 36.9 % (ref 34.0–46.6)
Hemoglobin: 12.5 g/dL (ref 11.1–15.9)
Immature Grans (Abs): 0 10*3/uL (ref 0.0–0.1)
Immature Granulocytes: 1 %
Lymphocytes Absolute: 1.7 10*3/uL (ref 0.7–3.1)
Lymphs: 28 %
MCH: 33 pg (ref 26.6–33.0)
MCHC: 33.9 g/dL (ref 31.5–35.7)
MCV: 97 fL (ref 79–97)
Monocytes Absolute: 0.5 10*3/uL (ref 0.1–0.9)
Monocytes: 9 %
Neutrophils Absolute: 3.7 10*3/uL (ref 1.4–7.0)
Neutrophils: 59 %
Platelets: 280 10*3/uL (ref 150–450)
RBC: 3.79 x10E6/uL (ref 3.77–5.28)
RDW: 11.6 % — ABNORMAL LOW (ref 11.7–15.4)
WBC: 6.1 10*3/uL (ref 3.4–10.8)

## 2023-06-17 LAB — LIPID PANEL WITH LDL/HDL RATIO
Cholesterol, Total: 237 mg/dL — ABNORMAL HIGH (ref 100–199)
HDL: 107 mg/dL (ref >39)
LDL Chol Calc (NIH): 116 mg/dL — ABNORMAL HIGH (ref 0–99)
LDL/HDL Ratio: 1.1 {ratio} (ref 0.0–3.2)
Triglycerides: 81 mg/dL (ref 0–149)
VLDL Cholesterol Cal: 14 mg/dL (ref 5–40)

## 2023-06-17 LAB — VITAMIN B12: Vitamin B-12: 970 pg/mL (ref 232–1245)

## 2023-06-17 LAB — VITAMIN D 25 HYDROXY (VIT D DEFICIENCY, FRACTURES): Vit D, 25-Hydroxy: 33.8 ng/mL (ref 30.0–100.0)

## 2023-06-17 LAB — TSH+FREE T4
Free T4: 1.12 ng/dL (ref 0.82–1.77)
TSH: 2.95 u[IU]/mL (ref 0.450–4.500)

## 2023-06-24 ENCOUNTER — Encounter: Payer: Self-pay | Admitting: Family Medicine

## 2023-06-27 ENCOUNTER — Encounter: Payer: Self-pay | Admitting: Family Medicine

## 2023-06-28 ENCOUNTER — Ambulatory Visit: Payer: Medicare PPO | Admitting: Sports Medicine

## 2023-06-28 ENCOUNTER — Ambulatory Visit: Payer: Medicare PPO | Admitting: Family Medicine

## 2023-08-04 ENCOUNTER — Ambulatory Visit: Admitting: Sports Medicine

## 2023-08-04 ENCOUNTER — Encounter: Payer: Self-pay | Admitting: Sports Medicine

## 2023-08-04 ENCOUNTER — Other Ambulatory Visit: Payer: Self-pay | Admitting: Family Medicine

## 2023-08-04 DIAGNOSIS — M160 Bilateral primary osteoarthritis of hip: Secondary | ICD-10-CM

## 2023-08-04 DIAGNOSIS — F3342 Major depressive disorder, recurrent, in full remission: Secondary | ICD-10-CM

## 2023-08-04 MED ORDER — NAPROXEN 500 MG PO TABS: 500.0000 mg | ORAL_TABLET | Freq: Two times a day (BID) | ORAL | 3 refills | Status: DC

## 2023-08-04 MED ORDER — TRAMADOL HCL 50 MG PO TABS: 50.0000 mg | ORAL_TABLET | Freq: Three times a day (TID) | ORAL | 0 refills | Status: DC | PRN

## 2023-08-04 NOTE — Assessment & Plan Note (Signed)
 This is a very pleasant 78 year old female, she has bilateral hip osteoarthritis, persistent pain on the right side, her last injection was performed bilaterally in October with ultrasound guidance. MRI ultimately showed loss of cartilage femoral head, labral tearing. Today she is a lot of pain, it is reproducible with internal rotation though her motion is quite good. At this point due to persistence of pain I would like a surgical consultation. I am not sure how much of her discomfort is coming from the labrum and how much is coming from the osteoarthritis itself though I think likely a combination. We have held off on injection today due to the potential of an arthroplasty. We will add naproxen 500 twice daily and tramadol for breakthrough pain.

## 2023-08-04 NOTE — Progress Notes (Signed)
    Procedures performed today:    None.  Independent interpretation of notes and tests performed by another provider:   None.  Brief History, Exam, Impression, and Recommendations:    Primary osteoarthritis of both hips This is a very pleasant 78 year old female, she has bilateral hip osteoarthritis, persistent pain on the right side, her last injection was performed bilaterally in October with ultrasound guidance. MRI ultimately showed loss of cartilage femoral head, labral tearing. Today she is a lot of pain, it is reproducible with internal rotation though her motion is quite good. At this point due to persistence of pain I would like a surgical consultation. I am not sure how much of her discomfort is coming from the labrum and how much is coming from the osteoarthritis itself though I think likely a combination. We have held off on injection today due to the potential of an arthroplasty. We will add naproxen 500 twice daily and tramadol for breakthrough pain.    ____________________________________________ Ihor Austin. [name], M.D., ABFM., CAQSM., AME. Primary Care and Sports Medicine [facility]  Adjunct Professor of Family Medicine  [employer] [facility]  Restaurant manager, fast food

## 2023-08-05 ENCOUNTER — Encounter: Payer: Self-pay | Admitting: Sports Medicine

## 2023-08-05 NOTE — Addendum Note (Signed)
 Addended by: Monica Becton on: 08/05/2023 04:00 PM   Modules accepted: Orders

## 2023-08-09 DIAGNOSIS — M7062 Trochanteric bursitis, left hip: Secondary | ICD-10-CM | POA: Diagnosis not present

## 2023-08-09 DIAGNOSIS — M7061 Trochanteric bursitis, right hip: Secondary | ICD-10-CM | POA: Diagnosis not present

## 2023-08-11 ENCOUNTER — Other Ambulatory Visit: Payer: Self-pay | Admitting: Family Medicine

## 2023-08-12 ENCOUNTER — Ambulatory Visit: Admitting: Orthopaedic Surgery

## 2023-08-12 ENCOUNTER — Other Ambulatory Visit: Payer: Self-pay

## 2023-08-12 MED ORDER — CLONAZEPAM 0.5 MG PO TABS: ORAL_TABLET | ORAL | 3 refills | Status: DC

## 2023-08-12 NOTE — Telephone Encounter (Signed)
 Requesting rx rf of clonazepam 0.5mg  Last written 01/26/2023 Last OV 06/16/2023 Upcoming appt = none

## 2023-08-12 NOTE — Telephone Encounter (Signed)
 Copied from CRM [id number]. Topic: Clinical - Medication Refill >> Aug 12, 2023 10:03 AM Claudine Mouton wrote: Most Recent Primary Care Visit:  Provider: Monica Becton  Department: [facility]  Visit Type: OFFICE VISIT  Date: 08/04/2023  Medication: clonazePAM (KLONOPIN) 0.5 MG tablet [[id number]]  Has the patient contacted their pharmacy? Yes (Agent: If no, request that the patient contact the pharmacy for the refill. If patient does not wish to contact the pharmacy document the reason why and proceed with request.) (Agent: If yes, when and what did the pharmacy advise?)  Is this the correct pharmacy for this prescription? Yes If no, delete pharmacy and type the correct one.  This is the patient's preferred pharmacy:   [facility] [phone] - [place], [place] - [address] [place] [address] Phone: [phone] Fax: [phone]  Has the prescription been filled recently? No  Is the patient out of the medication? Yes  Has the patient been seen for an appointment in the last year OR does the patient have an upcoming appointment? Yes  Can we respond through MyChart? Yes  Agent: Please be advised that Rx refills may take up to 3 business days. We ask that you follow-up with your pharmacy.

## 2023-08-23 ENCOUNTER — Telehealth: Payer: Self-pay | Admitting: Family Medicine

## 2023-08-23 DIAGNOSIS — Z13820 Encounter for screening for osteoporosis: Secondary | ICD-10-CM

## 2023-08-23 NOTE — Telephone Encounter (Signed)
 Copied from CRM [id number]. Topic: Appointments - Scheduling Inquiry for Clinic >> Aug 23, 2023 10:28 AM Shelby Dessert H wrote: Reason for CRM: Patient is asking for a request for Bone Density testing at [facility], patients provided fax number [phone], patients callback number is [phone].

## 2023-08-24 NOTE — Telephone Encounter (Signed)
 I don't have a bone density test referral? Is he requesting a new referral ?

## 2023-08-25 NOTE — Addendum Note (Signed)
 Addended by: Eivin Mascio E on: 08/25/2023 04:48 PM   Modules accepted: Orders

## 2023-08-29 ENCOUNTER — Telehealth: Payer: Self-pay

## 2023-08-29 ENCOUNTER — Telehealth: Payer: Self-pay | Admitting: Family Medicine

## 2023-08-29 NOTE — Telephone Encounter (Signed)
 Spoke with patient. She states she had spoke with [facility] and they did  not have the order for the dexa scan that was placed on 08/25/23. Would you look into this and let me know what I can do to help.

## 2023-08-29 NOTE — Telephone Encounter (Signed)
 Copied from CRM [id number]. Topic: Referral - Status >> Aug 29, 2023 12:24 PM Amanda Schmitt wrote: Reason for CRM: Patient was calling to get the status of her bone density order from [facility]. Patient stated that she spoke with them earlier and they don't have any fax sent or orders in place form Dr. Aletha Hutching to schedule her Bone density test. Patients callback number is [phone].

## 2023-08-29 NOTE — Telephone Encounter (Deleted)
 Copied from CRM [id number]. Topic: Referral - Status >> Aug 29, 2023 12:00 PM Suzette B wrote: Reason for CRM: Patient called in to check status of referral for the bone density test I advised the the patient the information had been sent over and I would have them call to update her on the status. [phone](Patient). Patient stated she didn't want an update on the status she wants an appointment. I advised her of the necessary steps taken to get a referral she stated she was calling the imaging facility herself to see had the referral been sent over.

## 2023-08-29 NOTE — Telephone Encounter (Signed)
 Error

## 2023-08-31 NOTE — Telephone Encounter (Signed)
 Spoke with patient . Still have not heard about schlding bone density.  Refaxed order to [facility] patient [facility] Fax# [phone]

## 2023-09-08 DIAGNOSIS — M160 Bilateral primary osteoarthritis of hip: Secondary | ICD-10-CM | POA: Diagnosis not present

## 2023-09-29 ENCOUNTER — Ambulatory Visit
Admission: RE | Admit: 2023-09-29 | Discharge: 2023-09-29 | Disposition: A | Discharge status: Home / Self Care | Source: Ambulatory Visit | Attending: Family Medicine | Admitting: Family Medicine

## 2023-09-29 VITALS — BP 142/84 | HR 71 | Temp 99.1°F | Resp 16

## 2023-09-29 DIAGNOSIS — S90562A Insect bite (nonvenomous), left ankle, initial encounter: Secondary | ICD-10-CM | POA: Diagnosis not present

## 2023-09-29 DIAGNOSIS — M5441 Lumbago with sciatica, right side: Secondary | ICD-10-CM

## 2023-09-29 DIAGNOSIS — W57XXXA Bitten or stung by nonvenomous insect and other nonvenomous arthropods, initial encounter: Secondary | ICD-10-CM | POA: Diagnosis not present

## 2023-09-29 DIAGNOSIS — R21 Rash and other nonspecific skin eruption: Secondary | ICD-10-CM | POA: Diagnosis not present

## 2023-09-29 MED ORDER — OXYCODONE-ACETAMINOPHEN 5-325 MG PO TABS: 1.0000 | ORAL_TABLET | Freq: Three times a day (TID) | ORAL | 0 refills | Status: AC | PRN

## 2023-09-29 MED ORDER — DOXYCYCLINE HYCLATE 100 MG PO CAPS: 100.0000 mg | ORAL_CAPSULE | Freq: Two times a day (BID) | ORAL | 0 refills | Status: AC

## 2023-09-29 MED ORDER — PREDNISONE 10 MG (21) PO TBPK: ORAL_TABLET | Freq: Every day | ORAL | 0 refills | Status: DC

## 2023-09-29 NOTE — Discharge Instructions (Addendum)
 Advised patient to take medications as directed with food to completion.  Advised patient to take prednisone  with first dose of doxycycline for the next 10 days or to completion.  Advised may use Percocet daily or as needed for severe/acute breakthrough right sided back pain.  Patient advised of sedative effects of this medication.  Encouraged to increase daily water intake to 64 ounces per day while taking these medications.  Advised if symptoms worsen and/or unresolved please follow-up with your PCP or here for further evaluation.

## 2023-09-29 NOTE — ED Triage Notes (Signed)
 [facility] with [facility] right sided back pain radiating down leg. Pt also reports tick bite on left ankle Sunday. She removed the tick but has redness at site.

## 2023-09-29 NOTE — ED Provider Notes (Signed)
 Amanda Schmitt [facility]    CSN: [id number] Arrival date & time: 09/29/23  1621      History   Chief Complaint Chief Complaint  Patient presents with   Back Pain    And tick bite - Entered by patient    HPI Amanda Schmitt is a 78 y.o. female.   HPI Very pleasant 78 year old female presents with right-sided lower back pain and tick bite to left ankle that occurred on Sunday.  Reports removing tick however redness still at site.  Patient reports experiencing right sided lower back pain that radiates to right buttocks right hip, and right upper leg.  Patient reports that she we will be scheduled for right hip replacement soon.  PMH significant for polyarthralgia with elevated CCP, osteopenia of multiple sites, and primary insomnia.  She reports taking prescribed tramadol  last night at home with no relief for right-sided radicular type pain  Past Medical History:  Diagnosis Date   Depression     Patient Active Problem List   Diagnosis Date Noted   Mass of hip region, right 02/16/2023   Thyroid  function study abnormality 02/10/2023   Primary osteoarthritis of both hips 01/03/2023   Polyarthralgia with elevated CCP 01/03/2023   Visit for suture removal 09/27/2022   Right cervical radiculopathy 07/02/2022   Headache 06/14/2022   Neck swelling 03/01/2022   Elevated blood-pressure reading without diagnosis of hypertension 01/11/2022   B12 deficiency 01/11/2022   Well adult exam 07/08/2021   Recurrent UTI 09/23/2020   Lumbar spondylosis 06/30/2020   Trochanteric bursitis of both hips 04/15/2020   GAD (generalized anxiety disorder) 10/16/2019   Trigeminal neuralgia 09/27/2019   Palpitations 09/27/2019   Epigastric pain 09/27/2019   Primary insomnia 06/22/2016   Osteopenia of multiple sites 08/11/2015   Mixed hyperlipidemia 08/11/2015   Major depressive disorder in full remission (HCC) 08/11/2015   Senile nuclear sclerosis 07/25/2014    Past Surgical History:  Procedure  Laterality Date   ABDOMINAL HYSTERECTOMY     ABDOMINAL HYSTERECTOMY     Bowel blockage     BOWEL RESECTION     CESAREAN SECTION      OB History   No obstetric history on file.      Home Medications    Prior to Admission medications   Medication Sig Start Date End Date Taking? Authorizing Provider  doxycycline (VIBRAMYCIN) 100 MG capsule Take 1 capsule (100 mg total) by mouth 2 (two) times daily for 10 days. 09/29/23 10/09/23 Yes Leonides Ramp, FNP  oxyCODONE-acetaminophen  (PERCOCET/ROXICET) 5-325 MG tablet Take 1 tablet by mouth every 8 (eight) hours as needed for up to 5 days for severe pain (pain score 7-10). 09/29/23 10/04/23 Yes Leonides Ramp, FNP  predniSONE  (STERAPRED UNI-PAK 21 TAB) 10 MG (21) TBPK tablet Take by mouth daily. Take 6 tabs by mouth daily  for 2 days, then 5 tabs for 2 days, then 4 tabs for 2 days, then 3 tabs for 2 days, 2 tabs for 2 days, then 1 tab by mouth daily for 2 days 09/29/23  Yes Leonides Ramp, FNP  clonazePAM  (KLONOPIN ) 0.5 MG tablet TAKE 1 TABLET BY MOUTH TWICE A DAY AS NEEDED FOR ANXIETY 08/12/23   Adela Holter, DO  clonazePAM  (KLONOPIN ) 0.5 MG tablet TAKE 1 TABLET BY MOUTH TWICE A DAY AS NEEDED FOR ANXIETY 08/12/23   Adela Holter, DO  cyanocobalamin  (VITAMIN B12) 1000 MCG tablet Take 1,000 mcg by mouth daily.    [provider]  diclofenac  Sodium (VOLTAREN ) 1 % GEL  Apply 4 g topically 4 (four) times daily. To affected joint. 05/10/23   Gean Keels, MD  escitalopram  (LEXAPRO ) 10 MG tablet Take 1 tablet (10 mg total) by mouth daily. Patient not taking: Reported on 08/04/2023 05/02/23   Adela Holter, DO  mometasone (ELOCON) 0.1 % lotion  01/03/23   [provider]  naproxen  (NAPROSYN ) 500 MG tablet Take 1 tablet (500 mg total) by mouth 2 (two) times daily with a meal. 08/04/23 08/03/24  Gean Keels, MD  zolpidem  (AMBIEN ) 5 MG tablet Take 1 tablet (5 mg total) by mouth at bedtime as needed. for sleep 06/14/23   Adela Holter, DO    Family History Family History  Problem Relation Age of Onset   Cancer Father        unorgin    Social History Social History   Tobacco Use   Smoking status: Former    Current packs/day: 0.00    Average packs/day: 0.5 packs/day for 26.0 years (13.0 ttl pk-yrs)    Types: Cigarettes    Start date: 81    Quit date: 1999    Years since quitting: 26.4   Smokeless tobacco: Never  Substance Use Topics   Alcohol use: Yes    Alcohol/week: 2.0 standard drinks of alcohol    Types: 2 Glasses of wine per week    Comment: per night   Drug use: No     Allergies   Moxifloxacin, Nitrofuran derivatives, Codeine, Meloxicam , and Tramadol    Review of Systems Review of Systems  Musculoskeletal:  Positive for back pain.  Skin:  Positive for rash.     Physical Exam Triage Vital Signs ED Triage Vitals  Encounter Vitals Group     BP      Systolic BP Percentile      Diastolic BP Percentile      Pulse      Resp      Temp      Temp src      SpO2      Weight      Height      Head Circumference      Peak Flow      Pain Score      Pain Loc      Pain Education      Exclude from Growth Chart    No data found.  Updated Vital Signs BP (!) 142/84   Pulse 71   Temp 99.1 F (37.3 C)   Resp 16   SpO2 98%   Physical Exam Vitals and nursing note reviewed.  Constitutional:      Appearance: Normal appearance. She is normal weight.  HENT:     Head: Normocephalic and atraumatic.     Mouth/Throat:     Mouth: Mucous membranes are moist.     Pharynx: Oropharynx is clear.  Eyes:     Extraocular Movements: Extraocular movements intact.     Conjunctiva/sclera: Conjunctivae normal.     Pupils: Pupils are equal, round, and reactive to light.  Cardiovascular:     Rate and Rhythm: Normal rate and regular rhythm.     Pulses: Normal pulses.     Heart sounds: Normal heart sounds.  Pulmonary:     Effort: Pulmonary effort is normal.     Breath sounds: Normal breath sounds.   Musculoskeletal:        General: Normal range of motion.     Cervical back: Normal range of motion and neck supple.  Skin:  General: Skin is warm and dry.     Comments: Left ankle dorsum superior aspect):~2.0 cm circular shaped erythematous lesion, with central bite mark-please see image below  Neurological:     General: No focal deficit present.     Mental Status: She is alert and oriented to person, place, and time. Mental status is at baseline.  Psychiatric:        Mood and Affect: Mood normal.        Behavior: Behavior normal.      UC Treatments / Results  Labs (all labs ordered are listed, but only abnormal results are displayed) Labs Reviewed - No data to display  EKG   Radiology No results found.  Procedures Procedures (including critical care time)  Medications Ordered in UC Medications - No data to display  Initial Impression / Assessment and Plan / UC Course  I have reviewed the triage vital signs and the nursing notes.  Pertinent labs & imaging results that were available during my care of the patient were reviewed by me and considered in my medical decision making (see chart for details).     MDM: 1.  Acute midline low back pain with right-sided sciatica-Rx'd Sterapred Unipak 21 tab 10 mg taper, Rx'd Percocet 5/325 mg tablet: Take 1 tablet every 8 hours, as needed for severe/acute right sided low back pain; tick bite of left ankle, initial encounter; 2.  Tick bite of left ankle, initial encounter-Rx'd doxycycline 100 mg capsule: Take 1 capsule twice daily x 10 days; 3.  Rash and nonspecific skin eruption-Rx'd doxycycline 100 mg capsule: Take 1 capsule twice daily x 10 days. Advised patient to take medications as directed with food to completion.  Advised patient to take prednisone  with first dose of doxycycline for the next 10 days or to completion.  Advised may use Percocet daily or as needed for severe/acute breakthrough right sided back pain.  Patient  advised of sedative effects of this medication.  Encouraged to increase daily water intake to 64 ounces per day while taking these medications.  Advised if symptoms worsen and/or unresolved please follow-up with your PCP or here for further evaluation.  Patient discharged home, hemodynamically stable. Final Clinical Impressions(s) / UC Diagnoses   Final diagnoses:  Acute midline low back pain with right-sided sciatica  Tick bite of left ankle, initial encounter  Rash and nonspecific skin eruption     Discharge Instructions      Advised patient to take medications as directed with food to completion.  Advised patient to take prednisone  with first dose of doxycycline for the next 10 days or to completion.  Advised may use Percocet daily or as needed for severe/acute breakthrough right sided back pain.  Patient advised of sedative effects of this medication.  Encouraged to increase daily water intake to 64 ounces per day while taking these medications.  Advised if symptoms worsen and/or unresolved please follow-up with your PCP or here for further evaluation.   ED Prescriptions     Medication Sig Dispense Auth. Provider   doxycycline (VIBRAMYCIN) 100 MG capsule Take 1 capsule (100 mg total) by mouth 2 (two) times daily for 10 days. 20 capsule Graziella Connery, FNP   predniSONE  (STERAPRED UNI-PAK 21 TAB) 10 MG (21) TBPK tablet Take by mouth daily. Take 6 tabs by mouth daily  for 2 days, then 5 tabs for 2 days, then 4 tabs for 2 days, then 3 tabs for 2 days, 2 tabs for 2 days, then 1 tab by  mouth daily for 2 days 42 tablet Leonides Ramp, FNP   oxyCODONE-acetaminophen  (PERCOCET/ROXICET) 5-325 MG tablet Take 1 tablet by mouth every 8 (eight) hours as needed for up to 5 days for severe pain (pain score 7-10). 15 tablet Caira Poche, FNP      I have reviewed the PDMP during this encounter.   Leonides Ramp, FNP 09/29/23 1800

## 2023-11-08 DIAGNOSIS — M1611 Unilateral primary osteoarthritis, right hip: Secondary | ICD-10-CM | POA: Diagnosis not present

## 2023-11-08 DIAGNOSIS — M25551 Pain in right hip: Secondary | ICD-10-CM | POA: Diagnosis not present

## 2023-11-09 DIAGNOSIS — M25552 Pain in left hip: Secondary | ICD-10-CM | POA: Diagnosis not present

## 2023-11-09 DIAGNOSIS — M25551 Pain in right hip: Secondary | ICD-10-CM | POA: Diagnosis not present

## 2023-11-15 ENCOUNTER — Encounter: Payer: Self-pay | Admitting: Family Medicine

## 2023-11-15 MED ORDER — ZOLPIDEM TARTRATE ER 6.25 MG PO TBCR: 6.2500 mg | EXTENDED_RELEASE_TABLET | Freq: Every evening | ORAL | 4 refills | Status: DC | PRN

## 2023-11-26 ENCOUNTER — Other Ambulatory Visit: Payer: Self-pay | Admitting: Family Medicine

## 2023-11-28 ENCOUNTER — Other Ambulatory Visit: Payer: Self-pay | Admitting: Sports Medicine

## 2023-11-28 ENCOUNTER — Ambulatory Visit: Payer: Medicare PPO

## 2023-11-28 DIAGNOSIS — M160 Bilateral primary osteoarthritis of hip: Secondary | ICD-10-CM

## 2023-12-05 ENCOUNTER — Ambulatory Visit: Admitting: Urgent Care

## 2023-12-05 DIAGNOSIS — Z780 Asymptomatic menopausal state: Secondary | ICD-10-CM | POA: Diagnosis not present

## 2023-12-05 DIAGNOSIS — Z1231 Encounter for screening mammogram for malignant neoplasm of breast: Secondary | ICD-10-CM | POA: Diagnosis not present

## 2023-12-05 DIAGNOSIS — Z13820 Encounter for screening for osteoporosis: Secondary | ICD-10-CM | POA: Diagnosis not present

## 2023-12-05 LAB — HM DEXA SCAN

## 2023-12-05 LAB — HM MAMMOGRAPHY

## 2023-12-06 ENCOUNTER — Ambulatory Visit: Admitting: Urgent Care

## 2023-12-06 VITALS — BP 165/77 | HR 65 | Resp 20 | Ht 60.0 in | Wt 132.2 lb

## 2023-12-06 DIAGNOSIS — I10 Essential (primary) hypertension: Secondary | ICD-10-CM

## 2023-12-06 DIAGNOSIS — Z87891 Personal history of nicotine dependence: Secondary | ICD-10-CM

## 2023-12-06 DIAGNOSIS — R5383 Other fatigue: Secondary | ICD-10-CM | POA: Diagnosis not present

## 2023-12-06 DIAGNOSIS — M791 Myalgia, unspecified site: Secondary | ICD-10-CM | POA: Diagnosis not present

## 2023-12-06 DIAGNOSIS — R946 Abnormal results of thyroid function studies: Secondary | ICD-10-CM | POA: Diagnosis not present

## 2023-12-06 MED ORDER — AMLODIPINE BESYLATE 2.5 MG PO TABS: 2.5000 mg | ORAL_TABLET | Freq: Every day | ORAL | 0 refills | Status: DC

## 2023-12-06 NOTE — Progress Notes (Unsigned)
 Established Patient Office Visit  Subjective:  Patient ID: Amanda Schmitt, female    DOB: [birth year]  Age: 78 y.o. MRN: [id number]  Chief Complaint  Patient presents with   Follow-up    For tick bite    Pleasant 78 yo female with known hx of arthritis presents today with concern for fatigue. She reports about 10 days ago, she started developing symptoms of severe fatigue, coupled with generalized body aches like the flu. She reports being a very active person normally, but has been needing to take naps mid afternoon. Upon awakening from the nap, she states her fatigue has improved and body aches have resolved, until the next day. She is uncertain the cause. Denies excessive exertion and is at her normal level of functioning. She denies any recent URI and denies fever, chills or cough. She was treated for questionable Lymes disease in May and took a full course of doxy BID. She denies any rash. She denies CP, SOB, palpitations. Admits that her exercise tolerance is still the same and denies DOE. No edema. No recent change to medications. She is a former smoker and BP is elevated. She states she has been monitoring her BP at home and most of her home readings are similar to our reading here today (160 systolic).     Patient Active Problem List   Diagnosis Date Noted   Mass of hip region, right 02/16/2023   Thyroid  function study abnormality 02/10/2023   Primary osteoarthritis of both hips 01/03/2023   Polyarthralgia with elevated CCP 01/03/2023   Visit for suture removal 09/27/2022   Right cervical radiculopathy 07/02/2022   Headache 06/14/2022   Neck swelling 03/01/2022   Elevated blood-pressure reading without diagnosis of hypertension 01/11/2022   B12 deficiency 01/11/2022   Well adult exam 07/08/2021   Recurrent UTI 09/23/2020   Lumbar spondylosis 06/30/2020   Trochanteric bursitis of both hips 04/15/2020   GAD (generalized anxiety disorder) 10/16/2019   Trigeminal neuralgia  09/27/2019   Palpitations 09/27/2019   Epigastric pain 09/27/2019   Primary insomnia 06/22/2016   Osteopenia of multiple sites 08/11/2015   Mixed hyperlipidemia 08/11/2015   Major depressive disorder in full remission (HCC) 08/11/2015   Senile nuclear sclerosis 07/25/2014   Past Medical History:  Diagnosis Date   Depression    Past Surgical History:  Procedure Laterality Date   ABDOMINAL HYSTERECTOMY     ABDOMINAL HYSTERECTOMY     Bowel blockage     BOWEL RESECTION     CESAREAN SECTION     Social History   Tobacco Use   Smoking status: Former    Current packs/day: 0.00    Average packs/day: 0.5 packs/day for 26.0 years (13.0 ttl pk-yrs)    Types: Cigarettes    Start date: 76    Quit date: 1999    Years since quitting: 26.5   Smokeless tobacco: Never  Substance Use Topics   Alcohol use: Yes    Alcohol/week: 2.0 standard drinks of alcohol    Types: 2 Glasses of wine per week    Comment: per night   Drug use: No      ROS: as noted in HPI  Objective:     BP (!) 165/77 (BP Location: Left Arm, Patient Position: Sitting, Cuff Size: Normal)   Pulse 65   Resp 20   Ht 5' (1.524 m)   Wt 132 lb 4 oz (60 kg)   SpO2 100%   BMI 25.83 kg/m  BP Readings from Last 3  Encounters:  12/06/23 (!) 165/77  09/29/23 (!) 142/84  06/16/23 (!) 148/76   Wt Readings from Last 3 Encounters:  12/06/23 132 lb 4 oz (60 kg)  06/16/23 130 lb (59 kg)  02/10/23 127 lb (57.6 kg)      Physical Exam Vitals and nursing note reviewed.  Constitutional:      General: She is not in acute distress.    Appearance: Normal appearance. She is normal weight. She is not ill-appearing, toxic-appearing or diaphoretic.  HENT:     Right Ear: Tympanic membrane, ear canal and external ear normal. There is no impacted cerumen.     Left Ear: Tympanic membrane, ear canal and external ear normal. There is no impacted cerumen.     Nose: Nose normal.     Mouth/Throat:     Mouth: Mucous membranes are  moist.     Pharynx: Oropharynx is clear. No oropharyngeal exudate or posterior oropharyngeal erythema.  Eyes:     General: No scleral icterus.       Right eye: No discharge.     Comments: Mild conjunctival pallor noted bilaterally  Neck:     Thyroid : No thyroid  mass, thyromegaly or thyroid  tenderness.     Vascular: No carotid bruit.  Cardiovascular:     Rate and Rhythm: Normal rate and regular rhythm.     Heart sounds: Normal heart sounds. No murmur heard. Pulmonary:     Effort: Pulmonary effort is normal. No respiratory distress.     Breath sounds: Normal breath sounds. No stridor. No wheezing or rhonchi.  Musculoskeletal:        General: No swelling.     Cervical back: Normal range of motion and neck supple. No rigidity or tenderness.     Right lower leg: No edema.     Left lower leg: No edema.  Lymphadenopathy:     Cervical: No cervical adenopathy.  Skin:    General: Skin is warm and dry.     Coloration: Skin is not jaundiced.     Findings: No bruising, erythema or rash.  Neurological:     General: No focal deficit present.     Mental Status: She is alert and oriented to person, place, and time.     Motor: No weakness.     Gait: Gait normal.  Psychiatric:        Mood and Affect: Mood normal.        Behavior: Behavior normal.    Orders placed or performed in visit on 12/06/23   EKG 12-Lead   Normal sinus rhythm with no acute changes   No results found for any visits on 12/06/23.  Last CBC Lab Results  Component Value Date   WBC 6.1 06/16/2023   HGB 12.5 06/16/2023   HCT 36.9 06/16/2023   MCV 97 06/16/2023   MCH 33.0 06/16/2023   RDW 11.6 (L) 06/16/2023   PLT 280 06/16/2023   Last metabolic panel Lab Results  Component Value Date   GLUCOSE 91 06/16/2023   NA 138 06/16/2023   K 4.4 06/16/2023   CL 101 06/16/2023   CO2 22 06/16/2023   BUN 11 06/16/2023   CREATININE 0.79 06/16/2023   EGFR 77 06/16/2023   CALCIUM 9.5 06/16/2023   PROT 6.4 06/16/2023    ALBUMIN 4.2 06/16/2023   LABGLOB 2.2 06/16/2023   BILITOT 0.7 06/16/2023   ALKPHOS 81 06/16/2023   AST 26 06/16/2023   ALT 9 06/16/2023   Last lipids Lab Results  Component Value Date  CHOL 237 (H) 06/16/2023   HDL 107 06/16/2023   LDLCALC 116 (H) 06/16/2023   TRIG 81 06/16/2023   CHOLHDL 2.3 07/23/2022   Last hemoglobin A1c No results found for: HGBA1C Last thyroid  functions Lab Results  Component Value Date   TSH 2.950 06/16/2023   Last vitamin D  Lab Results  Component Value Date   VD25OH 33.8 06/16/2023   Last vitamin B12 and Folate Lab Results  Component Value Date   VITAMINB12 970 06/16/2023   FOLATE 14.0 03/01/2022      The ASCVD Risk score (Arnett DK, et al., 2019) failed to calculate for the following reasons:   The valid HDL cholesterol range is 20 to 100 mg/dL  Assessment & Plan:  Other fatigue -     CBC with Differential/Platelet -     TSH -     VITAMIN D  25 Hydroxy (Vit-D Deficiency, Fractures) -     Comprehensive metabolic panel with GFR -     A87 and Folate Panel -     T4, free -     CK total and CKMB (cardiac)not at [facility] -     EKG 12-Lead  Myalgia -     CK total and CKMB (cardiac)not at [facility] -     EKG 12-Lead  History of cigarette smoking  Thyroid  function study abnormality -     TSH -     T4, free  Essential hypertension -     TSH -     Comprehensive metabolic panel with GFR -     amLODIPine  Besylate; Take 1 tablet (2.5 mg total) by mouth daily.  Dispense: 30 tablet; Refill: 0  Pt with non-specific fatigue, no abnormalities noted on exam other than minimal conjunctival pallor. BP elevated, other vitals stable. Given risk factors, EKG was completed with no apparent abnormalities. BP has been mild to moderately elevated for some time, therefore will initiate low dose CCB as pt's home readings have also been elevated. Question if BP is contributing to current sx. Will also draw additional labs to further assess her concerns. Encouraged  resting and minimizing exertion until sx improved.  Will schedule 2 week follow up.   Return in about 2 weeks (around 12/20/2023).   Benton LITTIE Gave, PA

## 2023-12-06 NOTE — Patient Instructions (Addendum)
 Your EKG is unremarkable. We drew labs to further assess the cause of your symptoms.  I would like you to start on amlodipine . Your blood pressure is significantly elevated and may be contributing to your symptoms. Please return for a lab review/ follow up/ Blood pressure recheck in 2 weeks.

## 2023-12-07 ENCOUNTER — Ambulatory Visit: Payer: Self-pay | Admitting: Urgent Care

## 2023-12-07 ENCOUNTER — Encounter: Payer: Self-pay | Admitting: Urgent Care

## 2023-12-07 LAB — CBC WITH DIFFERENTIAL/PLATELET
Basophils Absolute: 0 x10E3/uL (ref 0.0–0.2)
Basos: 1 %
EOS (ABSOLUTE): 0.2 x10E3/uL (ref 0.0–0.4)
Eos: 4 %
Hematocrit: 36.9 % (ref 34.0–46.6)
Hemoglobin: 12.2 g/dL (ref 11.1–15.9)
Immature Grans (Abs): 0 x10E3/uL (ref 0.0–0.1)
Immature Granulocytes: 0 %
Lymphocytes Absolute: 2 x10E3/uL (ref 0.7–3.1)
Lymphs: 31 %
MCH: 33.1 pg — ABNORMAL HIGH (ref 26.6–33.0)
MCHC: 33.1 g/dL (ref 31.5–35.7)
MCV: 100 fL — ABNORMAL HIGH (ref 79–97)
Monocytes Absolute: 0.6 x10E3/uL (ref 0.1–0.9)
Monocytes: 9 %
Neutrophils Absolute: 3.6 x10E3/uL (ref 1.4–7.0)
Neutrophils: 55 %
Platelets: 291 x10E3/uL (ref 150–450)
RBC: 3.69 x10E6/uL — ABNORMAL LOW (ref 3.77–5.28)
RDW: 11.9 % (ref 11.7–15.4)
WBC: 6.4 x10E3/uL (ref 3.4–10.8)

## 2023-12-07 LAB — TSH: TSH: 2.18 u[IU]/mL (ref 0.450–4.500)

## 2023-12-07 LAB — COMPREHENSIVE METABOLIC PANEL WITH GFR
ALT: 10 IU/L (ref 0–32)
AST: 22 IU/L (ref 0–40)
Albumin: 4.4 g/dL (ref 3.8–4.8)
Alkaline Phosphatase: 95 IU/L (ref 44–121)
BUN/Creatinine Ratio: 23 (ref 12–28)
BUN: 14 mg/dL (ref 8–27)
Bilirubin Total: 0.4 mg/dL (ref 0.0–1.2)
CO2: 20 mmol/L (ref 20–29)
Calcium: 9.4 mg/dL (ref 8.7–10.3)
Chloride: 103 mmol/L (ref 96–106)
Creatinine, Ser: 0.61 mg/dL (ref 0.57–1.00)
Globulin, Total: 2.3 g/dL (ref 1.5–4.5)
Glucose: 84 mg/dL (ref 70–99)
Potassium: 4.2 mmol/L (ref 3.5–5.2)
Sodium: 140 mmol/L (ref 134–144)
Total Protein: 6.7 g/dL (ref 6.0–8.5)
eGFR: 91 mL/min/1.73 (ref >59)

## 2023-12-07 LAB — B12 AND FOLATE PANEL
Folate: 9.9 ng/mL (ref >3.0)
Vitamin B-12: 886 pg/mL (ref 232–1245)

## 2023-12-07 LAB — VITAMIN D 25 HYDROXY (VIT D DEFICIENCY, FRACTURES): Vit D, 25-Hydroxy: 22.6 ng/mL — ABNORMAL LOW (ref 30.0–100.0)

## 2023-12-07 LAB — CK TOTAL AND CKMB (NOT AT ARMC)
CK-MB Index: 1.1 ng/mL (ref 0.0–5.3)
Total CK: 55 U/L (ref 32–182)

## 2023-12-07 LAB — T4, FREE: Free T4: 1.08 ng/dL (ref 0.82–1.77)

## 2023-12-13 ENCOUNTER — Other Ambulatory Visit: Payer: Self-pay | Admitting: Sports Medicine

## 2023-12-13 DIAGNOSIS — M160 Bilateral primary osteoarthritis of hip: Secondary | ICD-10-CM

## 2023-12-20 ENCOUNTER — Ambulatory Visit: Admitting: Family Medicine

## 2023-12-20 VITALS — BP 107/66 | HR 74 | Resp 20 | Ht 60.0 in | Wt 130.9 lb

## 2023-12-20 DIAGNOSIS — F5101 Primary insomnia: Secondary | ICD-10-CM

## 2023-12-20 DIAGNOSIS — R030 Elevated blood-pressure reading, without diagnosis of hypertension: Secondary | ICD-10-CM | POA: Diagnosis not present

## 2023-12-20 DIAGNOSIS — M8589 Other specified disorders of bone density and structure, multiple sites: Secondary | ICD-10-CM | POA: Diagnosis not present

## 2023-12-20 NOTE — Assessment & Plan Note (Signed)
 BP is well controlled today but she is having some side effects including some flushing.  Will reduce to 1/2 tab.  Follow up in 3 months.

## 2023-12-20 NOTE — Assessment & Plan Note (Signed)
 Recent DEXA with t score -2.5 at femoral neck.  She is not supplementing with calcium and vitamin d  is low.  Recommend vitamin d  supplementation.

## 2023-12-21 ENCOUNTER — Encounter: Payer: Self-pay | Admitting: Family Medicine

## 2023-12-21 MED ORDER — VITAMIN D (ERGOCALCIFEROL) 1.25 MG (50000 UNIT) PO CAPS
50000.0000 [IU] | ORAL_CAPSULE | ORAL | 3 refills | Status: AC
Start: 2023-12-21 — End: ?

## 2023-12-25 ENCOUNTER — Encounter: Payer: Self-pay | Admitting: Family Medicine

## 2023-12-25 DIAGNOSIS — I10 Essential (primary) hypertension: Secondary | ICD-10-CM | POA: Insufficient documentation

## 2023-12-25 NOTE — Progress Notes (Signed)
 Amanda Schmitt - 78 y.o. female MRN [id number]  Date of birth: 1945-07-31  Subjective Chief Complaint  Patient presents with   Follow-up    bp    HPI Amanda Schmitt is a 78 y.o. female here today for follow-up visit.    She reports she is doing pretty well.  Amlodipine  2.5 mg added at previous appointment.  She has had some low blood pressure readings at home since starting this.  Occasional dizziness.  Has not had chest pain, shortness of breath, palpitations, headaches or vision change.    Continues with Ambien  as needed for insomnia.  This continues to work well for her.  ROS:  A comprehensive ROS was completed and negative except as noted per HPI   Allergies  Allergen Reactions   Moxifloxacin Other (See Comments)    Flu like symptoms   Nitrofuran Derivatives    Codeine Nausea Only   Meloxicam  Rash    Past Medical History:  Diagnosis Date   Depression     Past Surgical History:  Procedure Laterality Date   ABDOMINAL HYSTERECTOMY     ABDOMINAL HYSTERECTOMY     Bowel blockage     BOWEL RESECTION     CESAREAN SECTION      Social History   Socioeconomic History   Marital status: Widowed    Spouse name: Not on file   Number of children: 2   Years of education: 16   Highest education level: Bachelor's degree (e.g., BA, AB, BS)  Occupational History   Occupation: Retired    Comment: Runner, broadcasting/film/video  Tobacco Use   Smoking status: Former    Current packs/day: 0.00    Average packs/day: 0.5 packs/day for 26.0 years (13.0 ttl pk-yrs)    Types: Cigarettes    Start date: 81    Quit date: 1999    Years since quitting: 26.6   Smokeless tobacco: Never  Substance and Sexual Activity   Alcohol use: Yes    Alcohol/week: 2.0 standard drinks of alcohol    Types: 2 Glasses of wine per week    Comment: per night   Drug use: No   Sexual activity: Not Currently  Other Topics Concern   Not on file  Social History Narrative   Lives alone. Volunteers at [employer].  Enjoys doing crossword puzzles, reading and painting furniture.   Social Drivers of Corporate investment banker Strain: Low Risk  (12/06/2023)   Overall Financial Resource Strain (CARDIA)    Difficulty of Paying Living Expenses: Not hard at all  Food Insecurity: No Food Insecurity (12/06/2023)   Hunger Vital Sign    Worried About Running Out of Food in the Last Year: Never true    Ran Out of Food in the Last Year: Never true  Transportation Needs: No Transportation Needs (12/06/2023)   PRAPARE - Administrator, Civil Service (Medical): No    Lack of Transportation (Non-Medical): No  Physical Activity: Insufficiently Active (12/06/2023)   Exercise Vital Sign    Days of Exercise per Week: 3 days    Minutes of Exercise per Session: 30 min  Stress: No Stress Concern Present (05/10/2023)   [employer] of Occupational Health - Occupational Stress Questionnaire    Feeling of Stress : Not at all  Social Connections: Unknown (12/06/2023)   Social Connection and Isolation Panel    Frequency of Communication with Friends and Family: More than three times a week    Frequency of Social Gatherings with Friends  and Family: More than three times a week    Attends Religious Services: Patient declined    Active Member of Clubs or Organizations: Patient declined    Attends Banker Meetings: Not on file    Marital Status: Widowed    Family History  Problem Relation Age of Onset   Cancer Father        unorgin    Health Maintenance  Topic Date Due   Medicare Annual Wellness (AWV)  11/19/2022   INFLUENZA VACCINE  12/09/2023   COVID-19 Vaccine (6 - 2024-25 season) 12/30/2023 (Originally 01/09/2023)   Hepatitis C Screening  02/10/2024 (Originally 06/24/1963)   MAMMOGRAM  12/04/2024   DEXA SCAN  12/04/2025   DTaP/Tdap/Td (4 - Td or Tdap) 08/24/2026   Pneumococcal Vaccine: 50+ Years  Completed   Zoster Vaccines- Shingrix  Completed   HPV VACCINES  Aged Out   Meningococcal  B Vaccine  Aged Out   Pneumococcal Vaccine  Discontinued   Colonoscopy  Discontinued     ----------------------------------------------------------------------------------------------------------------------------------------------------------------------------------------------------------------- Physical Exam BP 107/66 (BP Location: Left Arm, Patient Position: Sitting, Cuff Size: Normal)   Pulse 74   Resp 20   Ht 5' (1.524 m)   Wt 130 lb 14.4 oz (59.4 kg)   SpO2 100%   BMI 25.56 kg/m   Physical Exam Constitutional:      Appearance: Normal appearance.  Eyes:     General: No scleral icterus. Cardiovascular:     Rate and Rhythm: Normal rate and regular rhythm.  Pulmonary:     Effort: Pulmonary effort is normal.     Breath sounds: Normal breath sounds.  Musculoskeletal:     Cervical back: Neck supple.  Neurological:     General: No focal deficit present.     Mental Status: She is alert.  Psychiatric:        Mood and Affect: Mood normal.        Behavior: Behavior normal.     ------------------------------------------------------------------------------------------------------------------------------------------------------------------------------------------------------------------- Assessment and Plan  Elevated blood-pressure reading without diagnosis of hypertension BP is well controlled today but she is having some side effects including some flushing.  Will reduce to 1/2 tab.  Follow up in 3 months.   Osteopenia of multiple sites Recent DEXA with t score -2.5 at femoral neck.  She is not supplementing with calcium and vitamin d  is low.  Recommend vitamin d  supplementation.    Primary insomnia She may continue Ambien  6.25 mg once daily.  No orders of the defined types were placed in this encounter.   Return in about 3 months (around 03/21/2024) for Hypertension.

## 2023-12-25 NOTE — Assessment & Plan Note (Addendum)
 She may continue Ambien  6.25 mg once daily.

## 2023-12-25 NOTE — Assessment & Plan Note (Signed)
 Blood pressures are a little low since starting amlodipine .  Will have her reduce to half tab and continue to monitor at home.

## 2023-12-28 ENCOUNTER — Other Ambulatory Visit: Payer: Self-pay | Admitting: Urgent Care

## 2023-12-28 DIAGNOSIS — I10 Essential (primary) hypertension: Secondary | ICD-10-CM

## 2024-01-05 ENCOUNTER — Encounter

## 2024-01-10 ENCOUNTER — Encounter: Payer: Self-pay | Admitting: Sports Medicine

## 2024-01-10 DIAGNOSIS — M1611 Unilateral primary osteoarthritis, right hip: Secondary | ICD-10-CM | POA: Diagnosis not present

## 2024-01-11 ENCOUNTER — Encounter: Payer: Self-pay | Admitting: Family Medicine

## 2024-01-11 DIAGNOSIS — I10 Essential (primary) hypertension: Secondary | ICD-10-CM

## 2024-01-12 MED ORDER — AMLODIPINE BESYLATE 2.5 MG PO TABS
2.5000 mg | ORAL_TABLET | Freq: Every day | ORAL | 1 refills | Status: DC
Start: 2024-01-12 — End: 2024-03-07

## 2024-01-12 NOTE — Telephone Encounter (Signed)
 Prescription sent to pharmacy per protocol as 30 day supply with one refill per patient last chart note

## 2024-01-19 ENCOUNTER — Ambulatory Visit

## 2024-01-19 ENCOUNTER — Telehealth: Payer: Self-pay

## 2024-01-19 VITALS — Ht 60.0 in | Wt 129.0 lb

## 2024-01-19 DIAGNOSIS — Z0000 Encounter for general adult medical examination without abnormal findings: Secondary | ICD-10-CM | POA: Diagnosis not present

## 2024-01-19 NOTE — Progress Notes (Signed)
 Subjective:   Amanda Schmitt is a 78 y.o. female who presents for Medicare Annual (Subsequent) preventive examination.  Visit Complete: Virtual I connected with  Ronal JAYSON Sprinkles on 01/19/24 by a audio enabled telemedicine application and verified that I am speaking with the correct person using two identifiers.  Patient Location: Home  Provider Location: Office/Clinic  I discussed the limitations of evaluation and management by telemedicine. The patient expressed understanding and agreed to proceed.  Vital Signs: Because this visit was a virtual/telehealth visit, some criteria may be missing or patient reported. Any vitals not documented were not able to be obtained and vitals that have been documented are patient reported.  Patient Medicare AWV questionnaire was completed by the patient on n/a; I have confirmed that all information answered by patient is correct and no changes since this date.  Cardiac Risk Factors include: advanced age (>83men, >26 women);Other (see comment)     Objective:    Today's Vitals   01/19/24 1400  Weight: 129 lb (58.5 kg)  Height: 5' (1.524 m)  PainSc: 4    Body mass index is 25.19 kg/m.     01/19/2024    2:10 PM 05/18/2023    2:49 PM 07/13/2022    2:09 PM 11/18/2021    9:57 AM 05/23/2020    1:07 PM 09/27/2019    3:45 PM  Advanced Directives  Does Patient Have a Medical Advance Directive? Yes Yes Yes Yes Yes Yes  Type of Estate agent of [place];Living will  Healthcare Power of [place];Living will Living will Healthcare Power of [place];Living will Healthcare Power of [place];Living will;Out of facility DNR (pink MOST or yellow form)  Does patient want to make changes to medical advance directive? No - Patient declined No - Patient declined  No - Patient declined No - Patient declined No - Patient declined  Copy of Healthcare Power of Attorney in Chart?   No - copy requested  Yes - validated most recent copy scanned in chart  (See row information)     Current Medications (verified) Outpatient Encounter Medications as of 01/19/2024  Medication Sig   amLODipine  (NORVASC ) 2.5 MG tablet Take 1 tablet (2.5 mg total) by mouth daily. (Patient taking differently: Take 1.25 mg by mouth daily.)   b complex vitamins capsule Take 1 capsule by mouth daily.   Cyanocobalamin  (VITAMIN B 12) 500 MCG TABS Take by mouth.   Vitamin D , Ergocalciferol , (DRISDOL ) 1.25 MG (50000 UNIT) CAPS capsule Take 1 capsule (50,000 Units total) by mouth every 7 (seven) days.   zolpidem  (AMBIEN  CR) 6.25 MG CR tablet Take 1 tablet (6.25 mg total) by mouth at bedtime as needed for sleep.   traMADol  (ULTRAM ) 50 MG tablet TAKE 1 TABLET (50 MG TOTAL) BY MOUTH EVERY 8 (EIGHT) HOURS AS NEEDED FOR MODERATE PAIN (PAIN SCORE 4-6). (Patient not taking: Reported on 01/19/2024)   [DISCONTINUED] naproxen  (NAPROSYN ) 500 MG tablet TAKE 1 TABLET BY MOUTH 2 TIMES DAILY WITH A MEAL.   No facility-administered encounter medications on file as of 01/19/2024.    Allergies (verified) Moxifloxacin, Nitrofuran derivatives, Codeine, and Meloxicam    History: Past Medical History:  Diagnosis Date   Depression    Past Surgical History:  Procedure Laterality Date   ABDOMINAL HYSTERECTOMY     ABDOMINAL HYSTERECTOMY     Bowel blockage     BOWEL RESECTION     CESAREAN SECTION     Family History  Problem Relation Age of Onset   Cancer Father  unorgin   Social History   Socioeconomic History   Marital status: Widowed    Spouse name: Not on file   Number of children: 2   Years of education: 16   Highest education level: Bachelor's degree (e.g., BA, AB, BS)  Occupational History   Occupation: Retired    Comment: Runner, broadcasting/film/video  Tobacco Use   Smoking status: Former    Current packs/day: 0.00    Average packs/day: 0.5 packs/day for 26.0 years (13.0 ttl pk-yrs)    Types: Cigarettes    Start date: 83    Quit date: 1999    Years since quitting: 26.7   Smokeless  tobacco: Never  Substance and Sexual Activity   Alcohol use: Yes    Alcohol/week: 2.0 standard drinks of alcohol    Types: 2 Glasses of wine per week    Comment: per night   Drug use: No   Sexual activity: Not Currently  Other Topics Concern   Not on file  Social History Narrative   Lives alone. Enjoys doing crossword puzzles, reading and painting furniture.   Social Drivers of Corporate investment banker Strain: Low Risk  (01/19/2024)   Overall Financial Resource Strain (CARDIA)    Difficulty of Paying Living Expenses: Not hard at all  Food Insecurity: No Food Insecurity (01/19/2024)   Hunger Vital Sign    Worried About Running Out of Food in the Last Year: Never true    Ran Out of Food in the Last Year: Never true  Transportation Needs: No Transportation Needs (01/19/2024)   PRAPARE - Administrator, Civil Service (Medical): No    Lack of Transportation (Non-Medical): No  Physical Activity: Inactive (01/19/2024)   Exercise Vital Sign    Days of Exercise per Week: 0 days    Minutes of Exercise per Session: 0 min  Stress: No Stress Concern Present (01/19/2024)   [employer] of Occupational Health - Occupational Stress Questionnaire    Feeling of Stress: Not at all  Social Connections: Socially Isolated (01/19/2024)   Social Connection and Isolation Panel    Frequency of Communication with Friends and Family: More than three times a week    Frequency of Social Gatherings with Friends and Family: Twice a week    Attends Religious Services: Never    Database administrator or Organizations: No    Attends Banker Meetings: Never    Marital Status: Widowed    Tobacco Counseling Counseling given: Not Answered   Clinical Intake:  Pre-visit preparation completed: Yes  Pain : 0-10 Pain Score: 4  Pain Type: Chronic pain Pain Location: Hip Pain Orientation: Right Pain Onset: More than a month ago     BMI - recorded: 25.19 Nutritional Status:  BMI 25 -29 Overweight Nutritional Risks: None Diabetes: No  How often do you need to have someone help you when you read instructions, pamphlets, or other written materials from your doctor or pharmacy?: 1 - Never What is the last grade level you completed in school?: 16  Interpreter Needed?: No      Activities of Daily Living    01/19/2024    2:01 PM  In your present state of health, do you have any difficulty performing the following activities:  Hearing? 0  Vision? 0  Difficulty concentrating or making decisions? 0  Walking or climbing stairs? 0  Dressing or bathing? 0  Doing errands, shopping? 0  Preparing Food and eating ? N  Using the Toilet? N  In the past six months, have you accidently leaked urine? N  Do you have problems with loss of bowel control? N  Managing your Medications? N  Managing your Finances? N  Housekeeping or managing your Housekeeping? N    Patient Care Team: Alvia Bring, DO as PCP - General (Family Medicine)  Indicate any recent Medical Services you may have received from other than [facility] providers in the past year (date may be approximate).     Assessment:   This is a routine wellness examination for [facility].  Hearing/Vision screen No results found.   Goals Addressed             This Visit's Progress    Patient Stated       Patient states she would like to have hip surgery.        Depression Screen    01/19/2024    2:09 PM 06/16/2023    2:43 PM 02/10/2023    1:29 PM 07/20/2022    1:58 PM 06/14/2022    4:25 PM 04/08/2022   10:38 AM 01/08/2022   11:49 AM  PHQ 2/9 Scores  PHQ - 2 Score 0 0 0 0 0 0 0  PHQ- 9 Score  0 0 0  0 0    Fall Risk    01/19/2024    2:11 PM 06/16/2023    2:42 PM 02/10/2023    1:29 PM 06/14/2022    4:25 PM 11/18/2021    9:57 AM  Fall Risk   Falls in the past year? 0 0 0 0 0  Number falls in past yr: 0 0 0 0 0  Injury with Fall? 0 0 0 0 0  Risk for fall due to : No Fall Risks No Fall Risks No Fall Risks No  Fall Risks No Fall Risks  Follow up Falls evaluation completed Falls evaluation completed Falls evaluation completed Falls evaluation completed Falls evaluation completed      Data saved with a previous flowsheet row definition     MEDICARE RISK AT HOME: Medicare Risk at Home Any stairs in or around the home?: Yes If so, are there any without handrails?: No Home free of loose throw rugs in walkways, pet beds, electrical cords, etc?: Yes Adequate lighting in your home to reduce risk of falls?: Yes Life alert?: No Use of a cane, walker or w/c?: No Grab bars in the bathroom?: No Shower chair or bench in shower?: Yes Elevated toilet seat or a handicapped toilet?: No  TIMED UP AND GO:  Was the test performed?  No    Cognitive Function:        01/19/2024    2:12 PM 11/18/2021   10:02 AM 05/23/2020    1:08 PM  6CIT Screen  What Year? 0 points 0 points 0 points  What month? 0 points 0 points 0 points  What time? 0 points 0 points 0 points  Count back from 20 0 points 0 points 0 points  Months in reverse 0 points 0 points 0 points  Repeat phrase 0 points 0 points 0 points  Total Score 0 points 0 points 0 points    Immunizations Immunization History  Administered Date(s) Administered   Fluad Quad(high Dose 65+) 01/15/2019, 02/05/2020   INFLUENZA, HIGH DOSE SEASONAL PF 02/13/2015, 03/18/2016, 02/02/2017, 02/10/2018, 01/15/2019   Influenza-Unspecified 02/23/2021   Moderna Covid-19 Fall Seasonal Vaccine 56yrs & older 02/20/2022   PFIZER(Purple Top)SARS-COV-2 Vaccination 06/07/2019, 07/01/2019, 02/20/2020, 09/12/2020   Pneumococcal Conjugate-13 02/13/2015   Pneumococcal  Polysaccharide-23 12/31/2011   Td 06/25/2002   Td (Adult),5 Lf Tetanus Toxid, Preservative Free 06/25/2002   Tdap 08/23/2016   Zoster Recombinant(Shingrix) 10/25/2020, 01/30/2021    TDAP status: Up to date  Flu Vaccine status: Due, Education has been provided regarding the importance of this vaccine. Advised  may receive this vaccine at local pharmacy or Health Dept. Aware to provide a copy of the vaccination record if obtained from local pharmacy or Health Dept. Verbalized acceptance and understanding.  Pneumococcal vaccine status: Due, Education has been provided regarding the importance of this vaccine. Advised may receive this vaccine at local pharmacy or Health Dept. Aware to provide a copy of the vaccination record if obtained from local pharmacy or Health Dept. Verbalized acceptance and understanding.  Covid-19 vaccine status: Information provided on how to obtain vaccines.   Qualifies for Shingles Vaccine? Yes   Zostavax completed No   Shingrix Completed?: Yes  Screening Tests Health Maintenance  Topic Date Due   Influenza Vaccine  12/09/2023   COVID-19 Vaccine (6 - 2025-26 season) 01/09/2024   Hepatitis C Screening  02/10/2024 (Originally 06/24/1963)   Mammogram  12/04/2024   Medicare Annual Wellness (AWV)  01/18/2025   DEXA SCAN  12/04/2025   DTaP/Tdap/Td (4 - Td or Tdap) 08/24/2026   Pneumococcal Vaccine: 50+ Years  Completed   Zoster Vaccines- Shingrix  Completed   HPV VACCINES  Aged Out   Meningococcal B Vaccine  Aged Out   Colonoscopy  Discontinued    Health Maintenance  Health Maintenance Due  Topic Date Due   Influenza Vaccine  12/09/2023   COVID-19 Vaccine (6 - 2025-26 season) 01/09/2024    Colorectal cancer screening: No longer required.   Mammogram status: Completed 7/282025. Repeat every year  Bone Density status: Completed 12/05/2023. Results reflect: Bone density results: OSTEOPOROSIS. Repeat every 2 years.  Lung Cancer Screening: (Low Dose CT Chest recommended if Age 12-80 years, 20 pack-year currently smoking OR have quit w/in 15years.) does not qualify.   Lung Cancer Screening Referral: n/a  Additional Screening:  Hepatitis C Screening: does not qualify; Completed   Vision Screening: Recommended annual ophthalmology exams for early detection of  glaucoma and other disorders of the eye. Is the patient up to date with their annual eye exam?  Yes  Who is the provider or what is the name of the office in which the patient attends annual eye exams? Dr Lupita If pt is not established with a provider, would they like to be referred to a provider to establish care? N/a.   Dental Screening: Recommended annual dental exams for proper oral hygiene   Community Resource Referral / Chronic Care Management: CRR required this visit?  No   CCM required this visit?  No     Plan:     I have personally reviewed and noted the following in the patient's chart:   Medical and social history Use of alcohol, tobacco or illicit drugs  Current medications and supplements including opioid prescriptions. Patient is not currently taking opioid prescriptions. Functional ability and status Nutritional status Physical activity Advanced directives List of other physicians Hospitalizations, surgeries, and ER visits in previous 12 months. None Vitals Screenings to include cognitive, depression, and falls Referrals and appointments  In addition, I have reviewed and discussed with patient certain preventive protocols, quality metrics, and best practice recommendations. A written personalized care plan for preventive services as well as general preventive health recommendations were provided to patient.     Bonny Jon Mayor, CMA  01/19/2024   After Visit Summary: (MyChart) Due to this being a telephonic visit, the after visit summary with patients personalized plan was offered to patient via MyChart   Nurse Notes:   Amanda Schmitt is a 78 y.o. female patient of Alvia Bring, DO who had a Medicare Annual Wellness Visit today via telephone. Clint is Retired and lives alone. She has 2 children. She reports that she is socially active and does interact with friends/family regularly. She is minimally physically active due to hip pain. She enjoys doing  crossword puzzles, reading and painting furniture.

## 2024-01-19 NOTE — Patient Instructions (Signed)
  Ms. Moller , Thank you for taking time to come for your Medicare Wellness Visit. I appreciate your ongoing commitment to your health goals. Please review the following plan we discussed and let me know if I can assist you in the future.   These are the goals we discussed:  Goals       Patient Stated (pt-stated)      05/23/2020 AWV Goal: Improved Nutrition/Diet  Patient will verbalize understanding that diet plays an important role in overall health and that a poor diet is a risk factor for many chronic medical conditions.  Over the next year, patient will improve self management of their diet. Patient will utilize available community resources to help with food acquisition if needed. Patient will work with nutrition specialist if a referral was made       Patient Stated      11/18/2021 AWV Goal: Exercise for General Health  Patient will verbalize understanding of the benefits of increased physical activity: Exercising regularly is important. It will improve your overall fitness, flexibility, and endurance. Regular exercise also will improve your overall health. It can help you control your weight, reduce stress, and improve your bone density. Over the next year, patient will increase physical activity as tolerated with a goal of at least 150 minutes of moderate physical activity per week.  You can tell that you are exercising at a moderate intensity if your heart starts beating faster and you start breathing faster but can still hold a conversation. Moderate-intensity exercise ideas include: Walking 1 mile (1.6 km) in about 15 minutes Biking Hiking Golfing Dancing Water aerobics Patient will verbalize understanding of everyday activities that increase physical activity by providing examples like the following: Yard work, such as: Insurance underwriter Gardening Washing windows or floors Patient will  be able to explain general safety guidelines for exercising:  Before you start a new exercise program, talk with your health care provider. Do not exercise so much that you hurt yourself, feel dizzy, or get very short of breath. Wear comfortable clothes and wear shoes with good support. Drink plenty of water while you exercise to prevent dehydration or heat stroke. Work out until your breathing and your heartbeat get faster.       Patient Stated      Patient states she would like to have hip surgery.         This is a list of the screening recommended for you and due dates:  Health Maintenance  Topic Date Due   Flu Shot  12/09/2023   COVID-19 Vaccine (6 - 2025-26 season) 01/09/2024   Hepatitis C Screening  02/10/2024*   Breast Cancer Screening  12/04/2024   Medicare Annual Wellness Visit  01/18/2025   DEXA scan (bone density measurement)  12/04/2025   DTaP/Tdap/Td vaccine (4 - Td or Tdap) 08/24/2026   Pneumococcal Vaccine for age over 35  Completed   Zoster (Shingles) Vaccine  Completed   HPV Vaccine  Aged Out   Meningitis B Vaccine  Aged Out   Colon Cancer Screening  Discontinued  *Topic was postponed. The date shown is not the original due date.

## 2024-01-19 NOTE — Telephone Encounter (Signed)
 Copied from CRM [id number]. Topic: Medical Record Request - Other >> Jan 19, 2024 12:21 PM Susanna ORN wrote: Reason for CRM: Amanda Schmitt, with [facility], called stating that they received surgical clearance forms. They are needing patient's office visit notes from last visit and also if there were labs drawn, they are needing those records as well. Please fax to [phone].

## 2024-01-19 NOTE — Telephone Encounter (Signed)
  Is there a surgical clearance visit needed ?

## 2024-01-23 NOTE — Telephone Encounter (Signed)
 Faxed 12/20/2023 OV notes and 12/06/2023 lab work  to [facility]  with  [employer]

## 2024-01-31 ENCOUNTER — Other Ambulatory Visit: Payer: Self-pay

## 2024-01-31 DIAGNOSIS — M160 Bilateral primary osteoarthritis of hip: Secondary | ICD-10-CM

## 2024-01-31 DIAGNOSIS — R531 Weakness: Secondary | ICD-10-CM | POA: Diagnosis not present

## 2024-01-31 DIAGNOSIS — M25551 Pain in right hip: Secondary | ICD-10-CM | POA: Diagnosis not present

## 2024-01-31 DIAGNOSIS — R2689 Other abnormalities of gait and mobility: Secondary | ICD-10-CM | POA: Diagnosis not present

## 2024-01-31 MED ORDER — TRAMADOL HCL 50 MG PO TABS: 50.0000 mg | ORAL_TABLET | Freq: Three times a day (TID) | ORAL | 0 refills | Status: DC | PRN

## 2024-01-31 MED ORDER — TRAMADOL HCL 50 MG PO TABS: 50.0000 mg | ORAL_TABLET | Freq: Three times a day (TID) | ORAL | 0 refills | Status: AC | PRN

## 2024-01-31 NOTE — Addendum Note (Signed)
 Addended by: Carey Lafon E on: 01/31/2024 04:41 PM   Modules accepted: Orders

## 2024-02-04 ENCOUNTER — Other Ambulatory Visit: Payer: Self-pay | Admitting: Family Medicine

## 2024-02-04 DIAGNOSIS — I10 Essential (primary) hypertension: Secondary | ICD-10-CM

## 2024-02-13 DIAGNOSIS — M1611 Unilateral primary osteoarthritis, right hip: Secondary | ICD-10-CM | POA: Diagnosis not present

## 2024-03-02 ENCOUNTER — Ambulatory Visit: Admitting: Physical Therapy

## 2024-03-07 ENCOUNTER — Other Ambulatory Visit: Payer: Self-pay | Admitting: Family Medicine

## 2024-03-07 DIAGNOSIS — I10 Essential (primary) hypertension: Secondary | ICD-10-CM

## 2024-03-08 ENCOUNTER — Other Ambulatory Visit: Payer: Self-pay | Admitting: Family Medicine

## 2024-03-12 ENCOUNTER — Encounter: Payer: Self-pay | Admitting: Radiology

## 2024-03-21 ENCOUNTER — Ambulatory Visit: Admitting: Family Medicine

## 2024-03-22 ENCOUNTER — Ambulatory Visit: Admitting: Family Medicine

## 2024-03-22 VITALS — BP 128/71 | HR 75 | Ht 60.0 in | Wt 130.0 lb

## 2024-03-22 DIAGNOSIS — F411 Generalized anxiety disorder: Secondary | ICD-10-CM

## 2024-03-22 DIAGNOSIS — F5101 Primary insomnia: Secondary | ICD-10-CM | POA: Diagnosis not present

## 2024-03-22 DIAGNOSIS — E559 Vitamin D deficiency, unspecified: Secondary | ICD-10-CM | POA: Diagnosis not present

## 2024-03-22 DIAGNOSIS — R799 Abnormal finding of blood chemistry, unspecified: Secondary | ICD-10-CM

## 2024-03-22 DIAGNOSIS — M160 Bilateral primary osteoarthritis of hip: Secondary | ICD-10-CM | POA: Diagnosis not present

## 2024-03-22 DIAGNOSIS — R5383 Other fatigue: Secondary | ICD-10-CM | POA: Insufficient documentation

## 2024-03-22 NOTE — Assessment & Plan Note (Signed)
 She has continued fatigue, especially throughout the day.  Checking iron panel and recheck vitamin d .  Can consider modafinil if labs look ok.

## 2024-03-22 NOTE — Assessment & Plan Note (Signed)
 She may continue Ambien  6.25 mg once daily.

## 2024-03-22 NOTE — Assessment & Plan Note (Signed)
 She is seeing Dr. Yvone, contemplating replacement.  Continue meloxicam  with occasional tramadol  prn.

## 2024-03-22 NOTE — Assessment & Plan Note (Signed)
 Rare use of clonazepam .  Continue at current strength.

## 2024-03-22 NOTE — Progress Notes (Signed)
 Amanda Schmitt - 78 y.o. female MRN [id number]  Date of birth: 1946/01/07  Subjective Chief Complaint  Patient presents with   Hypertension   Fatigue    HPI Amanda Schmitt is a 78 y.o. female here today for follow up visit.   She reports that she is doing well.   She remains on amlodipine  at low strength.  BP is well controlled at this time. SABRA   Anxiety is stable with occasional use of clonazepam .  She remains on ambien  CR for insomnia.  She feels that this is working pretty well for her. .   She has tried tapering off and going without as well as trials of other medications and had severe insomnia.  She has experienced significant daytime fatige for hte past 1.5 year.  She has tried vitamin supplments without much releif.  Labs so far are unremarkable.   Meloxicam  as needed for hip pan.  Rare use of tramadol  for OA of the hip.  Previously managed by Dr. Curtis.  Bilateral degenerative changes on previous xrays.    ROS:  A comprehensive ROS was completed and negative except as noted per HPI  Allergies  Allergen Reactions   Moxifloxacin Other (See Comments)    Flu like symptoms   Nitrofuran Derivatives    Suvorexant Other (See Comments)    suvorexant   Codeine Nausea Only    Past Medical History:  Diagnosis Date   Depression     Past Surgical History:  Procedure Laterality Date   ABDOMINAL HYSTERECTOMY     ABDOMINAL HYSTERECTOMY     Bowel blockage     BOWEL RESECTION     CESAREAN SECTION      Social History   Socioeconomic History   Marital status: Widowed    Spouse name: Not on file   Number of children: 2   Years of education: 16   Highest education level: Bachelor's degree (e.g., BA, AB, BS)  Occupational History   Occupation: Retired    Comment: Runner, Broadcasting/film/video  Tobacco Use   Smoking status: Former    Current packs/day: 0.00    Average packs/day: 0.5 packs/day for 26.0 years (13.0 ttl pk-yrs)    Types: Cigarettes    Start date: 57    Quit date: 1999     Years since quitting: 26.8   Smokeless tobacco: Never  Substance and Sexual Activity   Alcohol use: Yes    Alcohol/week: 2.0 standard drinks of alcohol    Types: 2 Glasses of wine per week    Comment: per night   Drug use: No   Sexual activity: Not Currently  Other Topics Concern   Not on file  Social History Narrative   Lives alone. Enjoys doing crossword puzzles, reading and painting furniture.   Social Drivers of Corporate Investment Banker Strain: Low Risk  (03/22/2024)   Overall Financial Resource Strain (CARDIA)    Difficulty of Paying Living Expenses: Not hard at all  Food Insecurity: Unknown (03/22/2024)   Hunger Vital Sign    Worried About Running Out of Food in the Last Year: Never true    Ran Out of Food in the Last Year: Not on file  Transportation Needs: No Transportation Needs (03/22/2024)   PRAPARE - Administrator, Civil Service (Medical): No    Lack of Transportation (Non-Medical): No  Physical Activity: Unknown (03/22/2024)   Exercise Vital Sign    Days of Exercise per Week: 3 days    Minutes of  Exercise per Session: Not on file  Recent Concern: Physical Activity - Inactive (01/19/2024)   Exercise Vital Sign    Days of Exercise per Week: 0 days    Minutes of Exercise per Session: 0 min  Stress: No Stress Concern Present (03/22/2024)   [employer] of Occupational Health - Occupational Stress Questionnaire    Feeling of Stress: Not at all  Social Connections: Unknown (03/22/2024)   Social Connection and Isolation Panel    Frequency of Communication with Friends and Family: More than three times a week    Frequency of Social Gatherings with Friends and Family: More than three times a week    Attends Religious Services: Patient declined    Database Administrator or Organizations: Not on file    Attends Banker Meetings: Not on file    Marital Status: Widowed  Recent Concern: Social Connections - Socially Isolated (01/19/2024)    Social Connection and Isolation Panel    Frequency of Communication with Friends and Family: More than three times a week    Frequency of Social Gatherings with Friends and Family: Twice a week    Attends Religious Services: Never    Database Administrator or Organizations: No    Attends Banker Meetings: Never    Marital Status: Widowed    Family History  Problem Relation Age of Onset   Cancer Father        unorgin    Health Maintenance  Topic Date Due   Hepatitis C Screening  Never done   Influenza Vaccine  12/09/2023   COVID-19 Vaccine (6 - 2025-26 season) 01/09/2024   Mammogram  12/04/2024   Medicare Annual Wellness (AWV)  01/18/2025   DEXA SCAN  12/04/2025   DTaP/Tdap/Td (4 - Td or Tdap) 08/24/2026   Pneumococcal Vaccine: 50+ Years  Completed   Zoster Vaccines- Shingrix  Completed   Meningococcal B Vaccine  Aged Out   Colonoscopy  Discontinued     ----------------------------------------------------------------------------------------------------------------------------------------------------------------------------------------------------------------- Physical Exam BP (!) 161/72 (BP Location: Left Arm, Patient Position: Sitting, Cuff Size: Normal)   Pulse 75   Ht 5' (1.524 m)   Wt 130 lb (59 kg)   SpO2 97%   BMI 25.39 kg/m   Physical Exam Constitutional:      Appearance: Normal appearance.  Eyes:     General: No scleral icterus. Cardiovascular:     Rate and Rhythm: Regular rhythm.     Pulses: Normal pulses.  Musculoskeletal:     Cervical back: Neck supple.  Neurological:     Mental Status: She is alert.  Psychiatric:        Mood and Affect: Mood normal.        Behavior: Behavior normal.     ------------------------------------------------------------------------------------------------------------------------------------------------------------------------------------------------------------------- Assessment and Plan  Fatigue She  has continued fatigue, especially throughout the day.  Checking iron panel and recheck vitamin d .  Can consider modafinil if labs look ok.    GAD (generalized anxiety disorder) Rare use of clonazepam .  Continue at current strength.   Primary insomnia She may continue Ambien  6.25 mg once daily.  Primary osteoarthritis of both hips She is seeing Dr. Yvone, contemplating replacement.  Continue meloxicam  with occasional tramadol  prn.    No orders of the defined types were placed in this encounter.   No follow-ups on file.

## 2024-03-23 ENCOUNTER — Ambulatory Visit: Payer: Self-pay | Admitting: Family Medicine

## 2024-03-23 LAB — IRON,TIBC AND FERRITIN PANEL
Ferritin: 102 ng/mL (ref 15–150)
Iron Saturation: 32 % (ref 15–55)
Iron: 85 ug/dL (ref 27–139)
Total Iron Binding Capacity: 264 ug/dL (ref 250–450)
UIBC: 179 ug/dL (ref 118–369)

## 2024-03-23 LAB — VITAMIN D 25 HYDROXY (VIT D DEFICIENCY, FRACTURES): Vit D, 25-Hydroxy: 59.9 ng/mL (ref 30.0–100.0)

## 2024-04-03 DIAGNOSIS — M1611 Unilateral primary osteoarthritis, right hip: Secondary | ICD-10-CM | POA: Diagnosis not present

## 2024-04-03 DIAGNOSIS — R2241 Localized swelling, mass and lump, right lower limb: Secondary | ICD-10-CM | POA: Diagnosis not present

## 2024-04-05 ENCOUNTER — Other Ambulatory Visit: Payer: Self-pay | Admitting: Family Medicine

## 2024-04-09 ENCOUNTER — Encounter: Payer: Self-pay | Admitting: Family Medicine

## 2024-04-09 ENCOUNTER — Other Ambulatory Visit: Payer: Self-pay | Admitting: Family Medicine

## 2024-04-09 MED ORDER — CLONAZEPAM 0.5 MG PO TABS: ORAL_TABLET | ORAL | 3 refills | Status: AC

## 2024-04-09 MED ORDER — ZOLPIDEM TARTRATE ER 6.25 MG PO TBCR: 6.2500 mg | EXTENDED_RELEASE_TABLET | Freq: Every evening | ORAL | 4 refills | Status: AC | PRN

## 2024-04-11 DIAGNOSIS — M25551 Pain in right hip: Secondary | ICD-10-CM | POA: Diagnosis not present

## 2024-05-25 ENCOUNTER — Ambulatory Visit: Admitting: Rehabilitative and Restorative Service Providers"

## 2024-05-31 ENCOUNTER — Ambulatory Visit

## 2024-05-31 NOTE — Therapy (Incomplete)
 " OUTPATIENT PHYSICAL THERAPY LOWER EXTREMITY EVALUATION   Patient Name: Amanda Schmitt MRN: [id number] DOB:Apr 02, 1946, 79 y.o., female Today's Date: 05/31/2024  END OF SESSION:   Past Medical History:  Diagnosis Date   Depression    Past Surgical History:  Procedure Laterality Date   ABDOMINAL HYSTERECTOMY     ABDOMINAL HYSTERECTOMY     Bowel blockage     BOWEL RESECTION     CESAREAN SECTION     Patient Active Problem List   Diagnosis Date Noted   Fatigue 03/22/2024   Mass of hip region, right 02/16/2023   Thyroid  function study abnormality 02/10/2023   Primary osteoarthritis of both hips 01/03/2023   Polyarthralgia with elevated CCP 01/03/2023   Visit for suture removal 09/27/2022   Right cervical radiculopathy 07/02/2022   Headache 06/14/2022   Elevated blood-pressure reading without diagnosis of hypertension 01/11/2022   B12 deficiency 01/11/2022   Well adult exam 07/08/2021   Lumbar spondylosis 06/30/2020   Trochanteric bursitis of both hips 04/15/2020   GAD (generalized anxiety disorder) 10/16/2019   Trigeminal neuralgia 09/27/2019   Epigastric pain 09/27/2019   Primary insomnia 06/22/2016   Osteopenia of multiple sites 08/11/2015   Mixed hyperlipidemia 08/11/2015   Major depressive disorder in full remission 08/11/2015   Senile nuclear sclerosis 07/25/2014    PCP: Alvia Bring, DO  REFERRING PROVIDER: Norleen Gavel, MD  REFERRING DIAG: s/p total replacement of right hip   THERAPY DIAG:  No diagnosis found.  Rationale for Evaluation and Treatment: Rehabilitation  ONSET DATE: 05/23/24  SUBJECTIVE:   SUBJECTIVE STATEMENT: ***  PERTINENT HISTORY: S/p Rt anterior THA 05/23/24 Depression  PAIN:  Are you having pain? Yes: NPRS scale: *** Pain location: *** Pain description: *** Aggravating factors: *** Relieving factors: ***  PRECAUTIONS: Fall and Other: anterior hip  RED FLAGS: None   WEIGHT BEARING RESTRICTIONS: Yes WBAT RLE  FALLS:   Has patient fallen in last 6 months? {fallsyesno:27318}  LIVING ENVIRONMENT: Lives with: {[facility] lives with:25569::lives with their family} Lives in: {Lives in:[address]} Stairs: {opstairs:27293} Has following equipment at home: {Assistive devices:23999}  OCCUPATION: ***  PLOF: {PLOF:24004}  PATIENT GOALS: ***  NEXT MD VISIT: ***  OBJECTIVE:  Note: Objective measures were completed at Evaluation unless otherwise noted.  DIAGNOSTIC FINDINGS: no recent imaging on file   PATIENT SURVEYS:  LEFS: ***  COGNITION: Overall cognitive status: Within functional limits for tasks assessed     SENSATION: Not tested  EDEMA:  {edema:24020}    POSTURE: {posture:25561}  PALPATION: ***  LOWER EXTREMITY ROM:  Passive ROM Right eval Left eval  Hip flexion    Hip extension    Hip abduction    Hip adduction    Hip internal rotation    Hip external rotation    Knee flexion    Knee extension    Ankle dorsiflexion    Ankle plantarflexion    Ankle inversion    Ankle eversion     (Blank rows = not tested)  LOWER EXTREMITY MMT: deferred due to post-op acuity   MMT Right eval Left eval  Hip flexion    Hip extension    Hip abduction    Hip adduction    Hip internal rotation    Hip external rotation    Knee flexion    Knee extension    Ankle dorsiflexion    Ankle plantarflexion    Ankle inversion    Ankle eversion     (Blank rows = not tested)    FUNCTIONAL  TESTS:  {Functional tests:24029}  GAIT: Distance walked: 20 ft  Assistive device utilized: {Assistive devices:23999} Level of assistance: {Levels of assistance:24026} Comments: ***                                                                                                                                [facility] Adult PT Treatment:                                                DATE: 05/31/24 Therapeutic Exercise: Demonstrated and issue initial HEP.   Manual Therapy: *** Neuromuscular  re-ed: *** Therapeutic Activity: *** Modalities: *** Self Care: ***    PATIENT EDUCATION:  Education details: see treatment; POC Person educated: Patient Education method: Explanation, Demonstration, Tactile cues, Verbal cues, and Handouts Education comprehension: verbalized understanding, returned demonstration, verbal cues required, tactile cues required, and needs further education  HOME EXERCISE PROGRAM: ***  ASSESSMENT:  CLINICAL IMPRESSION: Patient is a 79 y.o. female who was seen today for physical therapy evaluation and treatment for s/p Rt THA anterior approach on 05/23/24. She demonstrates ROM, strength, gait and balance deficits that are consistent with her recent post-operative status. She will benefit from skilled PT to address the above stated deficits in order to return to optimal function.      OBJECTIVE IMPAIRMENTS: {opptimpairments:25111}.   ACTIVITY LIMITATIONS: {activitylimitations:27494}  PARTICIPATION LIMITATIONS: {participationrestrictions:25113}  PERSONAL FACTORS: {Personal factors:25162} are also affecting patient's functional outcome.   REHAB POTENTIAL: Good  CLINICAL DECISION MAKING: Stable/uncomplicated  EVALUATION COMPLEXITY: Low   GOALS: Goals reviewed with patient? Yes  SHORT TERM GOALS: Target date: 06/28/2024   Patient will be independent and compliant with initial HEP.   Baseline: initial HEP issued  Goal status: INITIAL  2.  Patient will demonstrate at least 90 degrees of Rt hip flexion AROM to improve sitting tolerance.  Baseline: see above Goal status: INITIAL  3.  *** Baseline:  Goal status: INITIAL  4.  *** Baseline:  Goal status: INITIAL  5.  *** Baseline:  Goal status: INITIAL  6.  *** Baseline:  Goal status: INITIAL  LONG TERM GOALS: Target date: 07/28/2024    Patient will score >/= ***/80 on the LEFS (MCID is 9) to signify clinically meaningful improvement in functional abilities.   Baseline: see  above Goal status: INITIAL  2.  Patient will ambulate community distances with LRAD with minimal/no Rt hip pain.  Baseline: *** Goal status: INITIAL  3.  Patient will demonstrate *** Baseline:  Goal status: INITIAL  4.  *** Baseline:  Goal status: INITIAL  5.  *** Baseline:  Goal status: INITIAL  6.  *** Baseline:  Goal status: INITIAL   PLAN:  PT FREQUENCY: 2x/week  PT DURATION: 8 weeks  PLANNED INTERVENTIONS: {rehab planned interventions:25118::97110-Therapeutic exercises,97530- Therapeutic [phone]- Neuromuscular re-education,97535- Self Rjmz,02859-  Manual therapy,Patient/Family education}  PLAN FOR NEXT SESSION: ***   Lakeysha Slutsky, PT, DPT, ATC 05/31/24 8:29 AM  Referring diagnosis? s/p total replacement of right hip  Treatment diagnosis? (if different than referring diagnosis) *** What was this (referring dx) caused by? [x]  Surgery []  Fall []  Ongoing issue []  Arthritis []  Other: ____________  Laterality: [x]  Rt []  Lt []  Both  Check all possible CPT codes:  *CHOOSE 10 OR LESS*    See Planned Interventions listed in the Plan section of the Evaluation.    "

## 2024-06-03 ENCOUNTER — Other Ambulatory Visit: Payer: Self-pay | Admitting: Family Medicine

## 2024-06-03 DIAGNOSIS — I10 Essential (primary) hypertension: Secondary | ICD-10-CM

## 2024-06-05 NOTE — Therapy (Incomplete)
 " OUTPATIENT PHYSICAL THERAPY LOWER EXTREMITY EVALUATION   Patient Name: Amanda Schmitt MRN: [id number] DOB:[birth year], 79 y.o., female Today's Date: 06/05/2024  END OF SESSION:   Past Medical History:  Diagnosis Date   Depression    Past Surgical History:  Procedure Laterality Date   ABDOMINAL HYSTERECTOMY     ABDOMINAL HYSTERECTOMY     Bowel blockage     BOWEL RESECTION     CESAREAN SECTION     Patient Active Problem List   Diagnosis Date Noted   Fatigue 03/22/2024   Mass of hip region, right 02/16/2023   Thyroid  function study abnormality 02/10/2023   Primary osteoarthritis of both hips 01/03/2023   Polyarthralgia with elevated CCP 01/03/2023   Visit for suture removal 09/27/2022   Right cervical radiculopathy 07/02/2022   Headache 06/14/2022   Elevated blood-pressure reading without diagnosis of hypertension 01/11/2022   B12 deficiency 01/11/2022   Well adult exam 07/08/2021   Lumbar spondylosis 06/30/2020   Trochanteric bursitis of both hips 04/15/2020   GAD (generalized anxiety disorder) 10/16/2019   Trigeminal neuralgia 09/27/2019   Epigastric pain 09/27/2019   Primary insomnia 06/22/2016   Osteopenia of multiple sites 08/11/2015   Mixed hyperlipidemia 08/11/2015   Major depressive disorder in full remission 08/11/2015   Senile nuclear sclerosis 07/25/2014    PCP: Alvia Bring, DO  REFERRING PROVIDER: Norleen Gavel, MD  REFERRING DIAG: s/p total replacement of right hip   THERAPY DIAG:  No diagnosis found.  Rationale for Evaluation and Treatment: Rehabilitation  ONSET DATE: 05/23/24  SUBJECTIVE:   SUBJECTIVE STATEMENT: ***  PERTINENT HISTORY: S/p Rt anterior THA 05/23/24 Depression  PAIN:  Are you having pain? Yes: NPRS scale: *** Pain location: *** Pain description: *** Aggravating factors: *** Relieving factors: ***  PRECAUTIONS: Fall and Other: anterior hip  RED FLAGS: None   WEIGHT BEARING RESTRICTIONS: Yes WBAT RLE  FALLS:   Has patient fallen in last 6 months? {fallsyesno:27318}  LIVING ENVIRONMENT: Lives with: {[facility] lives with:25569::lives with their family} Lives in: {Lives in:[address]} Stairs: {opstairs:27293} Has following equipment at home: {Assistive devices:23999}  OCCUPATION: ***  PLOF: {PLOF:24004}  PATIENT GOALS: ***  NEXT MD VISIT: ***  OBJECTIVE:  Note: Objective measures were completed at Evaluation unless otherwise noted.  DIAGNOSTIC FINDINGS: no recent imaging on file   PATIENT SURVEYS:  LEFS: ***  COGNITION: Overall cognitive status: Within functional limits for tasks assessed     SENSATION: Not tested  EDEMA:  {edema:24020}    POSTURE: {posture:25561}  PALPATION: ***  LOWER EXTREMITY ROM:  Passive ROM Right eval Left eval  Hip flexion    Hip extension    Hip abduction    Hip adduction    Hip internal rotation    Hip external rotation    Knee flexion    Knee extension    Ankle dorsiflexion    Ankle plantarflexion    Ankle inversion    Ankle eversion     (Blank rows = not tested)  LOWER EXTREMITY MMT: deferred due to post-op acuity   MMT Right eval Left eval  Hip flexion    Hip extension    Hip abduction    Hip adduction    Hip internal rotation    Hip external rotation    Knee flexion    Knee extension    Ankle dorsiflexion    Ankle plantarflexion    Ankle inversion    Ankle eversion     (Blank rows = not tested)    FUNCTIONAL  TESTS:  {Functional tests:24029}  GAIT: Distance walked: 20 ft  Assistive device utilized: {Assistive devices:23999} Level of assistance: {Levels of assistance:24026} Comments: ***                                                                                                                                [facility] Adult PT Treatment:                                                DATE: 05/31/24 Therapeutic Exercise: Demonstrated and issue initial HEP.   Manual Therapy: *** Neuromuscular  re-ed: *** Therapeutic Activity: *** Modalities: *** Self Care: ***    PATIENT EDUCATION:  Education details: see treatment; POC Person educated: Patient Education method: Explanation, Demonstration, Tactile cues, Verbal cues, and Handouts Education comprehension: verbalized understanding, returned demonstration, verbal cues required, tactile cues required, and needs further education  HOME EXERCISE PROGRAM: ***  ASSESSMENT:  CLINICAL IMPRESSION: Patient is a 79 y.o. female who was seen today for physical therapy evaluation and treatment for s/p Rt THA anterior approach on 05/23/24. She demonstrates ROM, strength, gait and balance deficits that are consistent with her recent post-operative status. She will benefit from skilled PT to address the above stated deficits in order to return to optimal function.      OBJECTIVE IMPAIRMENTS: {opptimpairments:25111}.   ACTIVITY LIMITATIONS: {activitylimitations:27494}  PARTICIPATION LIMITATIONS: {participationrestrictions:25113}  PERSONAL FACTORS: {Personal factors:25162} are also affecting patient's functional outcome.   REHAB POTENTIAL: Good  CLINICAL DECISION MAKING: Stable/uncomplicated  EVALUATION COMPLEXITY: Low   GOALS: Goals reviewed with patient? Yes  SHORT TERM GOALS: Target date: 06/28/2024   Patient will be independent and compliant with initial HEP.   Baseline: initial HEP issued  Goal status: INITIAL  2.  Patient will demonstrate at least 90 degrees of Rt hip flexion AROM to improve sitting tolerance.  Baseline: see above Goal status: INITIAL  3.  *** Baseline:  Goal status: INITIAL  4.  *** Baseline:  Goal status: INITIAL  5.  *** Baseline:  Goal status: INITIAL  6.  *** Baseline:  Goal status: INITIAL  LONG TERM GOALS: Target date: 07/28/2024    Patient will score >/= ***/80 on the LEFS (MCID is 9) to signify clinically meaningful improvement in functional abilities.   Baseline: see  above Goal status: INITIAL  2.  Patient will ambulate community distances with LRAD with minimal/no Rt hip pain.  Baseline: *** Goal status: INITIAL  3.  Patient will demonstrate *** Baseline:  Goal status: INITIAL  4.  *** Baseline:  Goal status: INITIAL  5.  *** Baseline:  Goal status: INITIAL  6.  *** Baseline:  Goal status: INITIAL   PLAN:  PT FREQUENCY: 2x/week  PT DURATION: 8 weeks  PLANNED INTERVENTIONS: {rehab planned interventions:25118::97110-Therapeutic exercises,97530- Therapeutic [phone]- Neuromuscular re-education,97535- Self Rjmz,02859-  Manual therapy,Patient/Family education}  PLAN FOR NEXT SESSION: ***   Timberlynn Kizziah, PT, DPT, ATC 06/05/24 3:08 PM  Referring diagnosis? s/p total replacement of right hip  Treatment diagnosis? (if different than referring diagnosis) *** What was this (referring dx) caused by? [x]  Surgery []  Fall []  Ongoing issue []  Arthritis []  Other: ____________  Laterality: [x]  Rt []  Lt []  Both  Check all possible CPT codes:  *CHOOSE 10 OR LESS*    See Planned Interventions listed in the Plan section of the Evaluation.    "

## 2024-06-06 ENCOUNTER — Ambulatory Visit

## 2024-07-12 ENCOUNTER — Ambulatory Visit: Admitting: Family Medicine

## 2024-09-19 ENCOUNTER — Ambulatory Visit: Admitting: Family Medicine
